# Patient Record
Sex: Male | Born: 1945 | Race: White | Hispanic: No | Smoking: Current some day smoker
Health system: Southern US, Community
[De-identification: ages and names within clinical notes are randomized; demographics above are authoritative.]

## PROBLEM LIST (undated history)

## (undated) ENCOUNTER — Inpatient Hospital Stay: Payer: Medicare HMO

## (undated) DIAGNOSIS — I7121 Aneurysm of the ascending aorta, without rupture: Secondary | ICD-10-CM

## (undated) DIAGNOSIS — Z720 Tobacco use: Secondary | ICD-10-CM

## (undated) DIAGNOSIS — I350 Nonrheumatic aortic (valve) stenosis: Secondary | ICD-10-CM

## (undated) DIAGNOSIS — J432 Centrilobular emphysema: Secondary | ICD-10-CM

## (undated) DIAGNOSIS — I10 Essential (primary) hypertension: Secondary | ICD-10-CM

## (undated) DIAGNOSIS — E785 Hyperlipidemia, unspecified: Secondary | ICD-10-CM

## (undated) DIAGNOSIS — I712 Thoracic aortic aneurysm, without rupture: Secondary | ICD-10-CM

## (undated) HISTORY — DX: Thoracic aortic aneurysm, without rupture: I71.2

## (undated) HISTORY — DX: Essential (primary) hypertension: I10

## (undated) HISTORY — DX: Aneurysm of the ascending aorta, without rupture: I71.21

## (undated) HISTORY — DX: Tobacco use: Z72.0

## (undated) HISTORY — DX: Nonrheumatic aortic (valve) stenosis: I35.0

## (undated) HISTORY — PX: CARDIAC CATHETERIZATION: SHX172

## (undated) HISTORY — PX: EYE SURGERY: SHX253

## (undated) HISTORY — DX: Hyperlipidemia, unspecified: E78.5

## (undated) HISTORY — DX: Centrilobular emphysema: J43.2

---

## 2018-06-12 ENCOUNTER — Other Ambulatory Visit: Payer: Self-pay | Admitting: Family Medicine

## 2018-06-12 ENCOUNTER — Ambulatory Visit
Admission: RE | Admit: 2018-06-12 | Discharge: 2018-06-12 | Disposition: A | Discharge status: Home / Self Care | Payer: Medicare HMO | Source: Ambulatory Visit | Attending: Family Medicine | Admitting: Family Medicine

## 2018-06-12 DIAGNOSIS — R55 Syncope and collapse: Secondary | ICD-10-CM

## 2019-12-10 ENCOUNTER — Other Ambulatory Visit: Payer: Self-pay | Admitting: Family Medicine

## 2019-12-10 DIAGNOSIS — F17219 Nicotine dependence, cigarettes, with unspecified nicotine-induced disorders: Secondary | ICD-10-CM

## 2019-12-25 ENCOUNTER — Ambulatory Visit
Admission: RE | Admit: 2019-12-25 | Discharge: 2019-12-25 | Disposition: A | Discharge status: Home / Self Care | Payer: Medicare HMO | Source: Ambulatory Visit | Attending: Family Medicine | Admitting: Family Medicine

## 2019-12-25 DIAGNOSIS — F17219 Nicotine dependence, cigarettes, with unspecified nicotine-induced disorders: Secondary | ICD-10-CM

## 2020-01-05 NOTE — Progress Notes (Deleted)
Primary Physician/Referring:  Tamsen Roers, MD  Patient ID: Kurt Allen, male    DOB: September 19, 1945, 74 y.o.   MRN: [id number]  Chief Complaint  Patient presents with  . New Patient (Initial Visit)  . Ascending thoracic aorta   HPI:    Kurt Allen  is a 74 y.o. Caucasian male with hypertension, tobacco use disorder, referred to [place] for evaluation of aortic valve disorder and aortic aneurysm.  He has 100+ year history of tobacco use, started smoking when he is 74 years of age and has been smoking about 2 packs cigarettes a day, since he was told to have an aneurysm in May 2021, he has reduced it to one fourth of a pack and is on the verge of quitting.  Except for chronic dyspnea denies palpitations, pain, claudication.  Past Medical History:  Diagnosis Date  . Hyperlipidemia   . Hypertension    History reviewed. No pertinent surgical history. History reviewed. No pertinent family history.  Social History   Tobacco Use  . Smoking status: Current Every Day Smoker    Packs/day: 0.25    Years: 50.00    Pack years: 12.50    Types: Cigarettes  . Smokeless tobacco: Never Used  Substance Use Topics  . Alcohol use: Yes    Alcohol/week: 10.0 standard drinks    Types: 10 Cans of beer per week    Comment: per week   Marital Status: Divorced  ROS  Review of Systems  Cardiovascular: Positive for dyspnea on exertion. Negative for chest pain and leg swelling.  Gastrointestinal: Negative for melena.   Objective  Blood pressure 135/82, pulse 72, temperature 98.2 F (36.8 C), temperature source Oral, resp. rate 15, height '5\' 8"'  (1.727 m), weight 156 lb (70.8 kg), SpO2 99 %.  Vitals with BMI 01/07/2020  Height '5\' 8"'   Weight 156 lbs  BMI 76.73  Systolic 419  Diastolic 82  Pulse 72     Physical Exam  Constitutional: He appears well-developed and well-nourished.  Cardiovascular: Normal rate.  Murmur heard.  Harsh midsystolic murmur is present with a grade of 3/6 at  the upper right sternal border. Pulses:      Carotid pulses are on the left side with bruit.      Femoral pulses are 2+ on the right side.      Popliteal pulses are 2+ on the right side and 2+ on the left side.       Dorsalis pedis pulses are 1+ on the right side and 2+ on the left side.       Posterior tibial pulses are 2+ on the right side and 2+ on the left side.  S1 is normal, S2 is muffled.  Pulmonary/Chest: Effort normal and breath sounds normal.  Emphysematous chest.  Abdominal: Soft. Bowel sounds are normal.   Laboratory examination:   No results for input(s): NA, K, CL, CO2, GLUCOSE, BUN, CREATININE, CALCIUM, GFRNONAA, GFRAA in the last 8760 hours. CrCl cannot be calculated (No successful lab value found.).  No flowsheet data found. No flowsheet data found. Lipid Panel  No results found for: CHOL, TRIG, HDL, CHOLHDL, VLDL, LDLCALC, LDLDIRECT HEMOGLOBIN A1C No results found for: HGBA1C, MPG TSH No results for input(s): TSH in the last 8760 hours.  External labs:  Recent labs:  10/28/2019: Glucose 99, BUN/Cr 12/1.05. EGFR 71. Na/K 135/4.6. Rest of the CMP normal  H/H 16.4/48.6. MCV 91.2. Platelets 240  HbA1C 5.2%  Chol 179, TG 72, HDL 59, LDL 104  TSH 1.39 normal  Medications and allergies  Not on File   Current Outpatient Medications  Medication Instructions  . albuterol (VENTOLIN HFA) 108 (90 Base) MCG/ACT inhaler 1 puff, As needed  . aspirin EC 81 mg, Oral, Daily  . Cholecalciferol (VITAMIN D3) 1.25 MG (50000 UT) CAPS 1 capsule, Oral, Weekly  . fluticasone (FLONASE) 50 MCG/ACT nasal spray As needed  . metoprolol succinate (TOPROL-XL) 50 MG 24 hr tablet 1 tablet, Oral, Daily  . nicotine (NICODERM CQ - DOSED IN MG/24 HOURS) 21 mg/24hr patch 1 patch, Daily  . rosuvastatin (CRESTOR) 5 mg, Oral, Daily  . tamsulosin (FLOMAX) 0.4 MG CAPS capsule 1 capsule, Oral, Daily   Radiology:   No results found.  Cardiac Studies:   CT Chest Lung 12/25/2019: 1. 8.2  mm irregular opacity anterior right lung apex, potentially scarring. Lung-RADS 4A, suspicious. Follow up low-dose chest CT without contrast in 3 months (please use the following order, "CT CHEST LCS NODULE FOLLOW-UP W/O CM") is recommended.  Alternatively, PET may be considered when there is a solid component 15m or larger. 2. 5.1 cm diameter ascending thoracic aorta with dense calcification of the aortic valve. Aortic stenosis would be a consideration. Ascending thoracic aortic aneurysm. Recommend semi-annual imaging followup by CTA or MRA and referral to cardiothoracic surgery if not already obtained. This recommendation follows 2010 ACCF/AHA/AATS/ACR/ASA/SCA/SCAI/SIR/STS/SVM Guidelines for the Diagnosis and Management of Patients With Thoracic Aortic Disease. Circulation. 2010; 121:: O671-I458   3. Emphysema (ICD10-J43.9) and Aortic Atherosclerosis.  EKG  EKG 01/07/2020: Normal sinus rhythm at rate of 70 bpm, left atrial enlargement, otherwise normal EKG.    Assessment     ICD-10-CM   1. Nonrheumatic aortic valve stenosis  I35.0 EKG 12-Lead    PCV ECHOCARDIOGRAM COMPLETE  2. Ascending aortic aneurysm (HCC)  I71.2   3. Coronary artery calcification seen on CAT scan 12/25/2019  I25.10   4. Dyspnea on exertion  R06.00   5. Tobacco use disorder  F17.200   6. Pure hypercholesterolemia  E78.00 rosuvastatin (CRESTOR) 10 MG tablet    Lipid Panel With LDL/HDL Ratio    Lipid Panel With LDL/HDL Ratio  7. Primary hypertension  I10 CMP14+EGFR    CMP14+EGFR  8. Pulsatile abdominal mass  R19.00 PCV AORTA DUPLEX  9. Left carotid bruit  R09.89 PCV CAROTID DUPLEX (BILATERAL)     Meds ordered this encounter  Medications  . rosuvastatin (CRESTOR) 10 MG tablet    Sig: Take 0.5 tablets (5 mg total) by mouth daily.    Dispense:  30 tablet    Refill:  2    Medications Discontinued During This Encounter  Medication Reason  . rosuvastatin (CRESTOR) 5 MG tablet Reorder    Recommendations:    Kurt Allen is a 74y.o. Caucasian male with hypertension, tobacco use disorder, [place].  He has 100+ year history of tobacco use, started smoking when he is 74years of age and has been smoking about 2 packs cigarettes a day, since he was told to have an aneurysm in May 2021, he has reduced it to one fourth of a pack and is on the verge of quitting.  I have congratulated him having made lifestyle changes especially smoking cessation.  Will increase Crestor from 5 mg every other day to 10 mg daily in view of coronary calcification noted on CT scan and AAA.  Aortic stenosis appears to be very severe on auscultation, I will obtain  an echocardiogram.  He also has very faint left carotid bruit, will obtain carotid artery duplex.  Mild pulsatile abdominal mass, need to exclude AAA will obtain aortic duplex.  Blood pressures well controlled, otherwise physical examination is unremarkable and I would like to see him back in 4 to 6 weeks for follow-up.  I will reserve doing stress test as I suspect his aortic stenosis may be severe and depending upon the findings I may consider either a stress test or cardiac catheterization.  I have reviewed his external records.  Adrian Prows, MD, [facility] 01/07/2020, 1:44 PM [facility]. [place] Office: [phone]

## 2020-01-07 ENCOUNTER — Other Ambulatory Visit: Payer: Self-pay

## 2020-01-07 ENCOUNTER — Encounter: Payer: Self-pay | Admitting: Cardiology

## 2020-01-07 ENCOUNTER — Ambulatory Visit: Payer: Medicare HMO | Admitting: Cardiology

## 2020-01-07 VITALS — BP 135/82 | HR 72 | Temp 98.2°F | Resp 15 | Ht 68.0 in | Wt 156.0 lb

## 2020-01-07 DIAGNOSIS — R0989 Other specified symptoms and signs involving the circulatory and respiratory systems: Secondary | ICD-10-CM

## 2020-01-07 DIAGNOSIS — I2510 Atherosclerotic heart disease of native coronary artery without angina pectoris: Secondary | ICD-10-CM

## 2020-01-07 DIAGNOSIS — I350 Nonrheumatic aortic (valve) stenosis: Secondary | ICD-10-CM

## 2020-01-07 DIAGNOSIS — I7121 Aneurysm of the ascending aorta, without rupture: Secondary | ICD-10-CM

## 2020-01-07 DIAGNOSIS — R0609 Other forms of dyspnea: Secondary | ICD-10-CM

## 2020-01-07 DIAGNOSIS — F17200 Nicotine dependence, unspecified, uncomplicated: Secondary | ICD-10-CM

## 2020-01-07 DIAGNOSIS — I10 Essential (primary) hypertension: Secondary | ICD-10-CM

## 2020-01-07 DIAGNOSIS — E7800 Pure hypercholesterolemia, unspecified: Secondary | ICD-10-CM

## 2020-01-07 DIAGNOSIS — R1900 Intra-abdominal and pelvic swelling, mass and lump, unspecified site: Secondary | ICD-10-CM

## 2020-01-07 MED ORDER — ROSUVASTATIN CALCIUM 10 MG PO TABS: 5.0000 mg | ORAL_TABLET | Freq: Every day | ORAL | 2 refills | Status: DC

## 2020-01-07 NOTE — Progress Notes (Signed)
Primary Physician/Referring:  Tamsen Roers, MD  Patient ID: Kurt Allen, male    DOB: Dec 06, 1945, 74 y.o.   MRN: [id number]  Chief Complaint  Patient presents with  . New Patient (Initial Visit)  . Ascending thoracic aorta   HPI:    Kurt Allen  is a 74 y.o. Caucasian male with hypertension, tobacco use disorder, referred to [place] for evaluation of aortic valve disorder and aortic aneurysm.  He has 100+ year history of tobacco use, started smoking when he is 74 years of age and has been smoking about 2 packs cigarettes a day, since he was told to have an aneurysm in May 2021, he has reduced it to one fourth of a pack and is on the verge of quitting.  Except for chronic dyspnea denies palpitations, pain, claudication.  Past Medical History:  Diagnosis Date  . Hyperlipidemia   . Hypertension    History reviewed. No pertinent surgical history. History reviewed. No pertinent family history.  Social History   Tobacco Use  . Smoking status: Current Every Day Smoker    Packs/day: 0.25    Years: 50.00    Pack years: 12.50    Types: Cigarettes  . Smokeless tobacco: Never Used  Substance Use Topics  . Alcohol use: Yes    Alcohol/week: 10.0 standard drinks    Types: 10 Cans of beer per week    Comment: per week   Marital Status: Divorced  ROS  Review of Systems  Cardiovascular: Positive for dyspnea on exertion. Negative for chest pain and leg swelling.  Gastrointestinal: Negative for melena.   Objective  Blood pressure 135/82, pulse 72, temperature 98.2 F (36.8 C), temperature source Oral, resp. rate 15, height '5\' 8"'  (1.727 m), weight 156 lb (70.8 kg), SpO2 99 %.  Vitals with BMI 01/07/2020  Height '5\' 8"'   Weight 156 lbs  BMI 28.76  Systolic 811  Diastolic 82  Pulse 72     Physical Exam  Constitutional: He appears well-developed and well-nourished.  Cardiovascular: Normal rate.  Murmur heard.  Harsh midsystolic murmur is present with a grade of 3/6 at  the upper right sternal border. Pulses:      Carotid pulses are on the left side with bruit.      Femoral pulses are 2+ on the right side.      Popliteal pulses are 2+ on the right side and 2+ on the left side.       Dorsalis pedis pulses are 1+ on the right side and 2+ on the left side.       Posterior tibial pulses are 2+ on the right side and 2+ on the left side.  S1 is normal, S2 is muffled.  Pulmonary/Chest: Effort normal and breath sounds normal.  Emphysematous chest.  Abdominal: Soft. Bowel sounds are normal.   Laboratory examination:   No results for input(s): NA, K, CL, CO2, GLUCOSE, BUN, CREATININE, CALCIUM, GFRNONAA, GFRAA in the last 8760 hours. CrCl cannot be calculated (No successful lab value found.).  No flowsheet data found. No flowsheet data found. Lipid Panel  No results found for: CHOL, TRIG, HDL, CHOLHDL, VLDL, LDLCALC, LDLDIRECT HEMOGLOBIN A1C No results found for: HGBA1C, MPG TSH No results for input(s): TSH in the last 8760 hours.  External labs:  Recent labs:  10/28/2019: Glucose 99, BUN/Cr 12/1.05. EGFR 71. Na/K 135/4.6. Rest of the CMP normal  H/H 16.4/48.6. MCV 91.2. Platelets 240  HbA1C 5.2%  Chol 179, TG 72, HDL 59, LDL 104  TSH 1.39 normal  Medications and allergies  Not on File   Current Outpatient Medications  Medication Instructions  . albuterol (VENTOLIN HFA) 108 (90 Base) MCG/ACT inhaler 1 puff, As needed  . aspirin EC 81 mg, Oral, Daily  . Cholecalciferol (VITAMIN D3) 1.25 MG (50000 UT) CAPS 1 capsule, Oral, Weekly  . fluticasone (FLONASE) 50 MCG/ACT nasal spray As needed  . metoprolol succinate (TOPROL-XL) 50 MG 24 hr tablet 1 tablet, Oral, Daily  . nicotine (NICODERM CQ - DOSED IN MG/24 HOURS) 21 mg/24hr patch 1 patch, Daily  . rosuvastatin (CRESTOR) 5 mg, Oral, Daily  . tamsulosin (FLOMAX) 0.4 MG CAPS capsule 1 capsule, Oral, Daily   Radiology:   No results found.  Cardiac Studies:   CT Chest Lung 12/25/2019: 1. 8.2  mm irregular opacity anterior right lung apex, potentially scarring. Lung-RADS 4A, suspicious. Follow up low-dose chest CT without contrast in 3 months (please use the following order, "CT CHEST LCS NODULE FOLLOW-UP W/O CM") is recommended.  Alternatively, PET may be considered when there is a solid component 39m or larger. 2. 5.1 cm diameter ascending thoracic aorta with dense calcification of the aortic valve. Aortic stenosis would be a consideration. Ascending thoracic aortic aneurysm. Recommend semi-annual imaging followup by CTA or MRA and referral to cardiothoracic surgery if not already obtained. This recommendation follows 2010 ACCF/AHA/AATS/ACR/ASA/SCA/SCAI/SIR/STS/SVM Guidelines for the Diagnosis and Management of Patients With Thoracic Aortic Disease. Circulation. 2010; 121:: H150-V697   3. Emphysema (ICD10-J43.9) and Aortic Atherosclerosis.  EKG  EKG 01/07/2020: Normal sinus rhythm at rate of 70 bpm, left atrial enlargement, otherwise normal EKG.    Assessment     ICD-10-CM   1. Nonrheumatic aortic valve stenosis  I35.0 EKG 12-Lead    PCV ECHOCARDIOGRAM COMPLETE  2. Ascending aortic aneurysm (HCC)  I71.2   3. Coronary artery calcification seen on CAT scan 12/25/2019  I25.10   4. Dyspnea on exertion  R06.00   5. Tobacco use disorder  F17.200   6. Pure hypercholesterolemia  E78.00 rosuvastatin (CRESTOR) 10 MG tablet    Lipid Panel With LDL/HDL Ratio    Lipid Panel With LDL/HDL Ratio  7. Primary hypertension  I10 CMP14+EGFR    CMP14+EGFR  8. Pulsatile abdominal mass  R19.00 PCV AORTA DUPLEX  9. Left carotid bruit  R09.89 PCV CAROTID DUPLEX (BILATERAL)     Meds ordered this encounter  Medications  . rosuvastatin (CRESTOR) 10 MG tablet    Sig: Take 0.5 tablets (5 mg total) by mouth daily.    Dispense:  30 tablet    Refill:  2    Medications Discontinued During This Encounter  Medication Reason  . rosuvastatin (CRESTOR) 5 MG tablet Reorder    Recommendations:    Kurt Allen is a 73y.o. Caucasian male with hypertension, tobacco use disorder, [place].  He has 100+ year history of tobacco use, started smoking when he is 74years of age and has been smoking about 2 packs cigarettes a day, since he was told to have an aneurysm in May 2021, he has reduced it to one fourth of a pack and is on the verge of quitting.  I have congratulated him having made lifestyle changes especially smoking cessation.  Will increase Crestor from 5 mg every other day to 10 mg daily in view of coronary calcification noted on CT scan and AAA.  Aortic stenosis appears to be very severe on auscultation, I will obtain  an echocardiogram.  He also has very faint left carotid bruit, will obtain carotid artery duplex.  Mild pulsatile abdominal mass, need to exclude AAA will obtain aortic duplex.  Blood pressures well controlled, otherwise physical examination is unremarkable and I would like to see him back in 4 to 6 weeks for follow-up.  I will reserve doing stress test as I suspect his aortic stenosis may be severe and depending upon the findings I may consider either a stress test or cardiac catheterization.  I have reviewed his external records.  Adrian Prows, MD, [facility] 01/07/2020, 4:34 PM [facility]. [place] Office: [phone]

## 2020-01-07 NOTE — Patient Instructions (Signed)
Lab work at [facility] in 1 month.

## 2020-01-13 ENCOUNTER — Other Ambulatory Visit: Payer: Self-pay

## 2020-01-13 ENCOUNTER — Ambulatory Visit: Payer: Medicare HMO

## 2020-01-13 DIAGNOSIS — R1900 Intra-abdominal and pelvic swelling, mass and lump, unspecified site: Secondary | ICD-10-CM

## 2020-01-13 DIAGNOSIS — R0989 Other specified symptoms and signs involving the circulatory and respiratory systems: Secondary | ICD-10-CM

## 2020-01-13 DIAGNOSIS — I700 Atherosclerosis of aorta: Secondary | ICD-10-CM

## 2020-01-13 DIAGNOSIS — I350 Nonrheumatic aortic (valve) stenosis: Secondary | ICD-10-CM

## 2020-01-18 ENCOUNTER — Other Ambulatory Visit: Payer: Self-pay | Admitting: Cardiology

## 2020-01-18 DIAGNOSIS — R0989 Other specified symptoms and signs involving the circulatory and respiratory systems: Secondary | ICD-10-CM

## 2020-01-19 NOTE — Progress Notes (Signed)
Change his appointment to me as he is new and I saw him.  Also need to see him sooner than 02/19/20, may be in couple weeks

## 2020-01-19 NOTE — Progress Notes (Signed)
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

## 2020-01-19 NOTE — Progress Notes (Signed)
Severe AS, will discuss on OV and probably need left and right heart cath

## 2020-02-03 LAB — CMP14+EGFR
ALT: 15 IU/L (ref 0–44)
AST: 17 IU/L (ref 0–40)
Albumin/Globulin Ratio: 2.4 — ABNORMAL HIGH (ref 1.2–2.2)
Albumin: 4.7 g/dL (ref 3.7–4.7)
Alkaline Phosphatase: 59 IU/L (ref 48–121)
BUN/Creatinine Ratio: 9 — ABNORMAL LOW (ref 10–24)
BUN: 9 mg/dL (ref 8–27)
Bilirubin Total: 0.5 mg/dL (ref 0.0–1.2)
CO2: 23 mmol/L (ref 20–29)
Calcium: 9.3 mg/dL (ref 8.6–10.2)
Chloride: 98 mmol/L (ref 96–106)
Creatinine, Ser: 1.02 mg/dL (ref 0.76–1.27)
GFR calc Af Amer: 84 mL/min/{1.73_m2} (ref >59)
GFR calc non Af Amer: 73 mL/min/{1.73_m2} (ref >59)
Globulin, Total: 2 g/dL (ref 1.5–4.5)
Glucose: 95 mg/dL (ref 65–99)
Potassium: 4.7 mmol/L (ref 3.5–5.2)
Sodium: 136 mmol/L (ref 134–144)
Total Protein: 6.7 g/dL (ref 6.0–8.5)

## 2020-02-03 LAB — LIPID PANEL WITH LDL/HDL RATIO
Cholesterol, Total: 133 mg/dL (ref 100–199)
HDL: 69 mg/dL (ref >39)
LDL Chol Calc (NIH): 52 mg/dL (ref 0–99)
LDL/HDL Ratio: 0.8 ratio (ref 0.0–3.6)
Triglycerides: 57 mg/dL (ref 0–149)
VLDL Cholesterol Cal: 12 mg/dL (ref 5–40)

## 2020-02-03 NOTE — Progress Notes (Signed)
CMP and lipids normal and will discuss on OV. CC PCP

## 2020-02-06 ENCOUNTER — Ambulatory Visit: Payer: Medicare HMO | Admitting: Cardiology

## 2020-02-06 ENCOUNTER — Other Ambulatory Visit: Payer: Self-pay

## 2020-02-06 ENCOUNTER — Encounter: Payer: Self-pay | Admitting: Cardiology

## 2020-02-06 VITALS — BP 151/94 | HR 56 | Ht 68.0 in | Wt 156.4 lb

## 2020-02-06 DIAGNOSIS — I10 Essential (primary) hypertension: Secondary | ICD-10-CM

## 2020-02-06 DIAGNOSIS — I2510 Atherosclerotic heart disease of native coronary artery without angina pectoris: Secondary | ICD-10-CM

## 2020-02-06 DIAGNOSIS — R0609 Other forms of dyspnea: Secondary | ICD-10-CM

## 2020-02-06 DIAGNOSIS — I6522 Occlusion and stenosis of left carotid artery: Secondary | ICD-10-CM

## 2020-02-06 DIAGNOSIS — I7121 Aneurysm of the ascending aorta, without rupture: Secondary | ICD-10-CM

## 2020-02-06 DIAGNOSIS — I350 Nonrheumatic aortic (valve) stenosis: Secondary | ICD-10-CM

## 2020-02-06 MED ORDER — LOSARTAN POTASSIUM-HCTZ 50-12.5 MG PO TABS: 1.0000 | ORAL_TABLET | Freq: Every morning | ORAL | 2 refills | Status: DC

## 2020-02-06 NOTE — Progress Notes (Signed)
Primary Physician/Referring:  Tamsen Roers, MD  Patient ID: Kurt Allen, male    DOB: [birth year], 74 y.o.   MRN: [id number]  Chief Complaint  Patient presents with  . Follow-up    6 week   . Shortness of Breath    aortic aneurysm   HPI:    Kurt Allen  is a 74 y.o. Caucasian male with hypertension, tobacco use disorder, referred to [place] for evaluation of aortic valve disorder and aortic aneurysm.  He has 100+ year history of tobacco use, started smoking when he is 74 years of age and has been smoking about 2 packs cigarettes a day, since he was told to have an aneurysm in May 2021, he has reduced it to one fourth of a pack and is on the verge of quitting.  Except for chronic dyspnea denies palpitations, pain, claudication.  On his last office visit a month ago, I had ordered echocardiogram and carotid artery duplex and he presents for follow-up.  No new symptomatology.  Past Medical History:  Diagnosis Date  . Hyperlipidemia   . Hypertension    History reviewed. No pertinent surgical history. History reviewed. No pertinent family history.  Social History   Tobacco Use  . Smoking status: Current Every Day Smoker    Packs/day: 0.25    Years: 50.00    Pack years: 12.50    Types: Cigarettes  . Smokeless tobacco: Never Used  Substance Use Topics  . Alcohol use: Yes    Alcohol/week: 10.0 standard drinks    Types: 10 Cans of beer per week    Comment: per week   Marital Status: Divorced  ROS  Review of Systems  Cardiovascular: Positive for dyspnea on exertion. Negative for chest pain and leg swelling.  Gastrointestinal: Negative for melena.   Objective  Blood pressure (!) 151/94, pulse (!) 56, height 5' 8" (1.727 m), weight 156 lb 6.4 oz (70.9 kg), SpO2 97 %.  Vitals with BMI 02/06/2020 01/07/2020  Height 5' 8" 5' 8"  Weight 156 lbs 6 oz 156 lbs  BMI 29.02 11.15  Systolic 520 802  Diastolic 94 82  Pulse 56 72     Physical Exam Constitutional:       Appearance: He is well-developed.  Cardiovascular:     Rate and Rhythm: Normal rate.     Pulses:          Carotid pulses are on the left side with bruit.      Femoral pulses are 2+ on the right side.      Popliteal pulses are 2+ on the right side and 2+ on the left side.       Dorsalis pedis pulses are 1+ on the right side and 2+ on the left side.       Posterior tibial pulses are 2+ on the right side and 2+ on the left side.     Heart sounds: Murmur heard.  Harsh midsystolic murmur is present with a grade of 3/6 at the upper right sternal border.      Comments: S1 is normal, S2 is muffled. Pulmonary:     Effort: Pulmonary effort is normal.     Breath sounds: Normal breath sounds.  Abdominal:     General: Bowel sounds are normal.     Palpations: Abdomen is soft.    Laboratory examination:   Recent Labs    02/02/20 0957  NA 136  K 4.7  CL 98  CO2 23  GLUCOSE 95  BUN 9  CREATININE 1.02  CALCIUM 9.3  GFRNONAA 73  GFRAA 84   estimated creatinine clearance is 63.3 mL/min (by C-G formula based on SCr of 1.02 mg/dL).  CMP Latest Ref Rng & Units 02/02/2020  Glucose 65 - 99 mg/dL 95  BUN 8 - 27 mg/dL 9  Creatinine 0.76 - 1.27 mg/dL 1.02  Sodium 134 - 144 mmol/L 136  Potassium 3.5 - 5.2 mmol/L 4.7  Chloride 96 - 106 mmol/L 98  CO2 20 - 29 mmol/L 23  Calcium 8.6 - 10.2 mg/dL 9.3  Total Protein 6.0 - 8.5 g/dL 6.7  Total Bilirubin 0.0 - 1.2 mg/dL 0.5  Alkaline Phos 48 - 121 IU/L 59  AST 0 - 40 IU/L 17  ALT 0 - 44 IU/L 15   No flowsheet data found. Lipid Panel     Component Value Date/Time   CHOL 133 02/02/2020 0956   TRIG 57 02/02/2020 0956   HDL 69 02/02/2020 0956   LDLCALC 52 02/02/2020 0956   HEMOGLOBIN A1C No results found for: HGBA1C, MPG TSH No results for input(s): TSH in the last 8760 hours.  External labs:  Recent labs:  10/28/2019: Glucose 99, BUN/Cr 12/1.05. EGFR 71. Na/K 135/4.6. Rest of the CMP normal  H/H 16.4/48.6. MCV 91.2. Platelets  240  HbA1C 5.2%  Chol 179, TG 72, HDL 59, LDL 104  TSH 1.39 normal  Medications and allergies   No Known Allergies   Current Outpatient Medications  Medication Instructions  . albuterol (VENTOLIN HFA) 108 (90 Base) MCG/ACT inhaler 1 puff, As needed  . aspirin EC 81 mg, Oral, Daily  . Cholecalciferol (VITAMIN D3) 1.25 MG (50000 UT) CAPS 1 capsule, Oral, Weekly  . fluticasone (FLONASE) 50 MCG/ACT nasal spray As needed  . losartan-hydrochlorothiazide (HYZAAR) 50-12.5 MG tablet 1 tablet, Oral,  Every morning - 10a  . metoprolol succinate (TOPROL-XL) 50 MG 24 hr tablet 1 tablet, Oral, Daily  . nicotine (NICODERM CQ - DOSED IN MG/24 HOURS) 21 mg/24hr patch 1 patch, Daily  . rosuvastatin (CRESTOR) 5 mg, Oral, Daily  . tamsulosin (FLOMAX) 0.4 MG CAPS capsule 1 capsule, Oral, Daily   Radiology:   CT Chest Lung 12/25/2019: 1. 8.2 mm irregular opacity anterior right lung apex, potentially scarring. Lung-RADS 4A, suspicious. Follow up low-dose chest CT without contrast in 3 months (please use the following order, "CT CHEST LCS NODULE FOLLOW-UP W/O CM") is recommended.  Alternatively, PET may be considered when there is a solid component 72m or larger. 2. 5.1 cm diameter ascending thoracic aorta with dense calcification of the aortic valve. Aortic stenosis would be a consideration. Ascending thoracic aortic aneurysm. Recommend semi-annual imaging followup by CTA or MRA and referral to cardiothoracic surgery if not already obtained. This recommendation follows 2010 ACCF/AHA/AATS/ACR/ASA/SCA/SCAI/SIR/STS/SVM Guidelines for the Diagnosis and Management of Patients With Thoracic Aortic Disease. Circulation. 2010; 121:: B017-P102   3. Emphysema (ICD10-J43.9) and Aortic Atherosclerosis.  Cardiac Studies:   Abdominal Aortic Duplex 01/13/2020:  The maximum aorta (sac) diameter is 2.22 cm (mid). Mild ectasia noted.  Diffuse plaque observed in the proximal, mid and distal aorta. Normal flow   velocities noted in the aorta and iliac arteries.  No AAA noted. Consider recheck in 10 years for progression to AAA.  Carotid artery duplex 058/52/7782  Peak systolic velocities in the right bifurcation, internal, external and  common carotid arteries are within normal limits.  Stenosis in the left internal carotid artery (16-49%). However left vessel  is tortuous, bruit and elevated velocity may  be related to geometry.  Antegrade right vertebral artery flow. Antegrade left vertebral artery  Flow.  Echocardiogram 01/13/2020:  Left ventricle cavity is normal in size. Moderate concentric hypertrophy  of the left ventricle. Normal global wall motion. Normal LV systolic  function with visual EF 50-55%. Indeterminate diastolic filling pattern.  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 Severe  annular and leaflet calcification.  Severe aortic stenosis. Aortic valve mean gradient of 44 mmHg, Vmax of 4.1  m/s. Calculated aortic valve area by continuity equation is 0.5 cm.  Mild (Grade I) aortic regurgitation.  Mild (Grade I) mitral regurgitation.  Inadequate TR jet to estimate pulmonary artery systolic pressure. Normal  right atrial pressure.  EKG  EKG 01/07/2020: Normal sinus rhythm at rate of 70 bpm, left atrial enlargement, otherwise normal EKG.   Assessment     ICD-10-CM   1. Coronary artery calcification seen on CAT scan 12/25/2019  T46.56 Basic metabolic panel    CBC  2. Ascending aortic aneurysm (HCC)  I71.2 PCV ANKLE BRACHIAL INDEX (ABI)  3. Nonrheumatic aortic valve stenosis  I35.0 Ambulatory referral to Pulmonology  4. Asymptomatic stenosis of left carotid artery  I65.22   5. Dyspnea on exertion  R06.00 Ambulatory referral to Pulmonology  6. Primary hypertension  I10 losartan-hydrochlorothiazide (HYZAAR) 50-12.5 MG tablet    Meds ordered this encounter  Medications  . losartan-hydrochlorothiazide (HYZAAR) 50-12.5 MG tablet    Sig: Take 1 tablet by mouth  every morning.    Dispense:  30 tablet    Refill:  2    There are no discontinued medications.  Recommendations:   Jamarion [place]  is a 74 y.o. Caucasian male with hypertension, tobacco use disorder, referred to [place] for evaluation of aortic valve disorder and aortic aneurysm.  He has 100+ year history of tobacco use, started smoking when he is 74 years of age and has been smoking about 2 packs cigarettes a day, since he was told to have an aneurysm in May 2021, he has reduced it to one fourth of a pack and is on the verge of quitting, now smoking 1/4th pack.  Except for chronic dyspnea no specific symptoms, he is tolerating Crestor, lipids are now well controlled.  I reviewed his echocardiogram and the aortic valve stenosis is severe.  In view of large ascending aortic aneurysm and also severe aortic stenosis in a patient will get dyspnea which may be related to underlying CAD versus mild COPD but could also be related to aortic stenosis, I have recommended that we proceed with right and left heart catheterization.  Schedule for cardiac catheterization, and possible angioplasty. We discussed regarding risks, benefits, alternatives to this including stress testing, CTA and continued medical therapy. Patient wants to proceed. Understands <1-2% risk of death, stroke, MI, urgent CABG, bleeding, infection, renal failure but not limited to these.   I will also refer him to be evaluated by pulmonary medicine in view of abnormal CT scan and to evaluate him for preoperatively stratification from pulmonary standpoint in view of long-term history of smoking.  With regard to abdominal aortic duplex, he has very mild ectasia/very small and small dilatation, less than 3 cm, hence will need repeat scanning probably in about 5 to 10 years.  He blood pressure is elevated today and also ascending aortic aneurysm, I started him on losartan HCT 50/12.5 mg in the morning.  He will obtain CBC and BMP in 10 days to 2  weeks.  We will obtain  presurgical ABI as well.  Carotid artery duplex reveals very mild stenosis in the left carotid artery.  He is now on a statin and is on aspirin, continue the same.  He will need annual surveillance.  I will see him back in 3 to 4 weeks for follow-up after the procedures.  Adrian Prows, MD, [facility] 02/06/2020, 12:33 PM [facility]. [place] Office: [phone]

## 2020-02-12 ENCOUNTER — Other Ambulatory Visit: Payer: Self-pay

## 2020-02-12 ENCOUNTER — Ambulatory Visit: Payer: Medicare HMO

## 2020-02-12 DIAGNOSIS — Z01810 Encounter for preprocedural cardiovascular examination: Secondary | ICD-10-CM

## 2020-02-12 DIAGNOSIS — I7121 Aneurysm of the ascending aorta, without rupture: Secondary | ICD-10-CM

## 2020-02-12 LAB — BASIC METABOLIC PANEL
BUN/Creatinine Ratio: 9 — ABNORMAL LOW (ref 10–24)
BUN: 10 mg/dL (ref 8–27)
CO2: 26 mmol/L (ref 20–29)
Calcium: 9.6 mg/dL (ref 8.6–10.2)
Chloride: 96 mmol/L (ref 96–106)
Creatinine, Ser: 1.16 mg/dL (ref 0.76–1.27)
GFR calc Af Amer: 72 mL/min/{1.73_m2} (ref >59)
GFR calc non Af Amer: 63 mL/min/{1.73_m2} (ref >59)
Glucose: 111 mg/dL — ABNORMAL HIGH (ref 65–99)
Potassium: 4.3 mmol/L (ref 3.5–5.2)
Sodium: 135 mmol/L (ref 134–144)

## 2020-02-12 LAB — CBC
Hematocrit: 46.4 % (ref 37.5–51.0)
Hemoglobin: 15.8 g/dL (ref 13.0–17.7)
MCH: 31 pg (ref 26.6–33.0)
MCHC: 34.1 g/dL (ref 31.5–35.7)
MCV: 91 fL (ref 79–97)
Platelets: 180 10*3/uL (ref 150–450)
RBC: 5.1 x10E6/uL (ref 4.14–5.80)
RDW: 13.2 % (ref 11.6–15.4)
WBC: 5.5 10*3/uL (ref 3.4–10.8)

## 2020-02-16 DIAGNOSIS — I7122 Aneurysm of the aortic arch, without rupture: Secondary | ICD-10-CM

## 2020-02-16 DIAGNOSIS — I350 Nonrheumatic aortic (valve) stenosis: Secondary | ICD-10-CM

## 2020-02-16 DIAGNOSIS — I7121 Aneurysm of the ascending aorta, without rupture: Secondary | ICD-10-CM

## 2020-02-16 DIAGNOSIS — I712 Thoracic aortic aneurysm, without rupture: Secondary | ICD-10-CM

## 2020-02-16 NOTE — Progress Notes (Signed)
Normal ABI

## 2020-02-17 ENCOUNTER — Ambulatory Visit (HOSPITAL_COMMUNITY)
Admission: RE | Admit: 2020-02-17 | Discharge: 2020-02-17 | Disposition: A | Discharge status: Home / Self Care | Payer: Medicare HMO | Attending: Cardiology | Admitting: Cardiology

## 2020-02-17 ENCOUNTER — Ambulatory Visit (HOSPITAL_COMMUNITY)
Admission: RE | Disposition: A | Discharge status: Home / Self Care | Payer: Self-pay | Source: Home / Self Care | Attending: Cardiology

## 2020-02-17 ENCOUNTER — Other Ambulatory Visit: Payer: Self-pay

## 2020-02-17 DIAGNOSIS — I6522 Occlusion and stenosis of left carotid artery: Secondary | ICD-10-CM | POA: Diagnosis not present

## 2020-02-17 DIAGNOSIS — I7122 Aneurysm of the aortic arch, without rupture: Secondary | ICD-10-CM

## 2020-02-17 DIAGNOSIS — E785 Hyperlipidemia, unspecified: Secondary | ICD-10-CM | POA: Diagnosis not present

## 2020-02-17 DIAGNOSIS — I712 Thoracic aortic aneurysm, without rupture: Secondary | ICD-10-CM | POA: Diagnosis not present

## 2020-02-17 DIAGNOSIS — F17210 Nicotine dependence, cigarettes, uncomplicated: Secondary | ICD-10-CM | POA: Diagnosis not present

## 2020-02-17 DIAGNOSIS — Z7982 Long term (current) use of aspirin: Secondary | ICD-10-CM | POA: Diagnosis not present

## 2020-02-17 DIAGNOSIS — R0600 Dyspnea, unspecified: Secondary | ICD-10-CM | POA: Insufficient documentation

## 2020-02-17 DIAGNOSIS — Z79899 Other long term (current) drug therapy: Secondary | ICD-10-CM | POA: Insufficient documentation

## 2020-02-17 DIAGNOSIS — I350 Nonrheumatic aortic (valve) stenosis: Secondary | ICD-10-CM | POA: Diagnosis present

## 2020-02-17 DIAGNOSIS — I10 Essential (primary) hypertension: Secondary | ICD-10-CM | POA: Insufficient documentation

## 2020-02-17 DIAGNOSIS — I7121 Aneurysm of the ascending aorta, without rupture: Secondary | ICD-10-CM

## 2020-02-17 HISTORY — PX: RIGHT HEART CATH AND CORONARY ANGIOGRAPHY: CATH118264

## 2020-02-17 LAB — POCT I-STAT 7, (LYTES, BLD GAS, ICA,H+H)
Acid-Base Excess: 3 mmol/L — ABNORMAL HIGH (ref 0.0–2.0)
Bicarbonate: 29.2 mmol/L — ABNORMAL HIGH (ref 20.0–28.0)
Calcium, Ion: 1.2 mmol/L (ref 1.15–1.40)
HCT: 40 % (ref 39.0–52.0)
Hemoglobin: 13.6 g/dL (ref 13.0–17.0)
O2 Saturation: 99 %
Potassium: 3.7 mmol/L (ref 3.5–5.1)
Sodium: 135 mmol/L (ref 135–145)
TCO2: 31 mmol/L (ref 22–32)
pCO2 arterial: 52.6 mmHg — ABNORMAL HIGH (ref 32.0–48.0)
pH, Arterial: 7.353 (ref 7.350–7.450)
pO2, Arterial: 131 mmHg — ABNORMAL HIGH (ref 83.0–108.0)

## 2020-02-17 LAB — POCT I-STAT EG7
Acid-Base Excess: 4 mmol/L — ABNORMAL HIGH (ref 0.0–2.0)
Bicarbonate: 30.8 mmol/L — ABNORMAL HIGH (ref 20.0–28.0)
Calcium, Ion: 1.24 mmol/L (ref 1.15–1.40)
HCT: 42 % (ref 39.0–52.0)
Hemoglobin: 14.3 g/dL (ref 13.0–17.0)
O2 Saturation: 72 %
Potassium: 3.9 mmol/L (ref 3.5–5.1)
Sodium: 134 mmol/L — ABNORMAL LOW (ref 135–145)
TCO2: 32 mmol/L (ref 22–32)
pCO2, Ven: 54.4 mmHg (ref 44.0–60.0)
pH, Ven: 7.361 (ref 7.250–7.430)
pO2, Ven: 40 mmHg (ref 32.0–45.0)

## 2020-02-17 SURGERY — RIGHT HEART CATH AND CORONARY ANGIOGRAPHY
Anesthesia: LOCAL

## 2020-02-17 MED ORDER — SODIUM CHLORIDE 0.9 % IV BOLUS
500.0000 mL | Freq: Once | INTRAVENOUS | Status: AC
  Administered 2020-02-17: 500 mL via INTRAVENOUS

## 2020-02-17 MED ORDER — ASPIRIN 81 MG PO CHEW: 81.0000 mg | CHEWABLE_TABLET | ORAL | Status: DC

## 2020-02-17 MED ORDER — MIDAZOLAM HCL 2 MG/2ML IJ SOLN
INTRAMUSCULAR | Status: AC
  Filled 2020-02-17: qty 2

## 2020-02-17 MED ORDER — ONDANSETRON HCL 4 MG/2ML IJ SOLN: 4.0000 mg | Freq: Four times a day (QID) | INTRAMUSCULAR | Status: DC | PRN

## 2020-02-17 MED ORDER — SODIUM CHLORIDE 0.9% FLUSH: 3.0000 mL | INTRAVENOUS | Status: DC | PRN

## 2020-02-17 MED ORDER — FENTANYL CITRATE (PF) 100 MCG/2ML IJ SOLN
INTRAMUSCULAR | Status: AC
  Filled 2020-02-17: qty 2

## 2020-02-17 MED ORDER — SODIUM CHLORIDE 0.9 % WEIGHT BASED INFUSION
1.0000 mL/kg/h | INTRAVENOUS | Status: DC
  Administered 2020-02-17: 250 mL via INTRAVENOUS

## 2020-02-17 MED ORDER — VERAPAMIL HCL 2.5 MG/ML IV SOLN
INTRAVENOUS | Status: AC
  Filled 2020-02-17: qty 2

## 2020-02-17 MED ORDER — ATROPINE SULFATE 1 MG/10ML IJ SOSY
PREFILLED_SYRINGE | INTRAMUSCULAR | Status: AC
  Filled 2020-02-17: qty 10

## 2020-02-17 MED ORDER — FENTANYL CITRATE (PF) 100 MCG/2ML IJ SOLN
INTRAMUSCULAR | Status: DC | PRN
  Administered 2020-02-17: 25 ug via INTRAVENOUS

## 2020-02-17 MED ORDER — LIDOCAINE HCL (PF) 1 % IJ SOLN
INTRAMUSCULAR | Status: AC
  Filled 2020-02-17: qty 30

## 2020-02-17 MED ORDER — SODIUM CHLORIDE 0.9 % IV SOLN: 250.0000 mL | INTRAVENOUS | Status: DC | PRN

## 2020-02-17 MED ORDER — HEPARIN SODIUM (PORCINE) 1000 UNIT/ML IJ SOLN
INTRAMUSCULAR | Status: DC | PRN
  Administered 2020-02-17: 4000 [IU] via INTRAVENOUS

## 2020-02-17 MED ORDER — HEPARIN (PORCINE) IN NACL 1000-0.9 UT/500ML-% IV SOLN
INTRAVENOUS | Status: AC
  Filled 2020-02-17: qty 1000

## 2020-02-17 MED ORDER — ATROPINE SULFATE 1 MG/10ML IJ SOSY
PREFILLED_SYRINGE | INTRAMUSCULAR | Status: DC | PRN
  Administered 2020-02-17: 0.5 mg via INTRAVENOUS

## 2020-02-17 MED ORDER — ATROPINE SULFATE 1 MG/10ML IJ SOSY
PREFILLED_SYRINGE | INTRAMUSCULAR | Status: AC
  Administered 2020-02-17: 0.3 mg via INTRAVENOUS
  Filled 2020-02-17: qty 10

## 2020-02-17 MED ORDER — HEPARIN SODIUM (PORCINE) 1000 UNIT/ML IJ SOLN
INTRAMUSCULAR | Status: AC
  Filled 2020-02-17: qty 1

## 2020-02-17 MED ORDER — HEPARIN (PORCINE) IN NACL 1000-0.9 UT/500ML-% IV SOLN
INTRAVENOUS | Status: DC | PRN
  Administered 2020-02-17: 500 mL

## 2020-02-17 MED ORDER — SODIUM CHLORIDE 0.9% FLUSH: 3.0000 mL | Freq: Two times a day (BID) | INTRAVENOUS | Status: DC

## 2020-02-17 MED ORDER — SODIUM CHLORIDE 0.9 % IV BOLUS: 500.0000 mL | Freq: Once | INTRAVENOUS | Status: DC

## 2020-02-17 MED ORDER — IOHEXOL 350 MG/ML SOLN
INTRAVENOUS | Status: DC | PRN
  Administered 2020-02-17: 35 mL

## 2020-02-17 MED ORDER — MIDAZOLAM HCL 2 MG/2ML IJ SOLN
INTRAMUSCULAR | Status: DC | PRN
  Administered 2020-02-17: 2 mg via INTRAVENOUS

## 2020-02-17 MED ORDER — LIDOCAINE HCL (PF) 1 % IJ SOLN
INTRAMUSCULAR | Status: DC | PRN
  Administered 2020-02-17: 2 mL

## 2020-02-17 MED ORDER — ATROPINE SULFATE 1 MG/10ML IJ SOSY: 0.3000 mg | PREFILLED_SYRINGE | Freq: Once | INTRAMUSCULAR | Status: AC

## 2020-02-17 MED ORDER — SODIUM CHLORIDE 0.9 % WEIGHT BASED INFUSION
3.0000 mL/kg/h | INTRAVENOUS | Status: AC
  Administered 2020-02-17: 3 mL/kg/h via INTRAVENOUS

## 2020-02-17 MED ORDER — ACETAMINOPHEN 325 MG PO TABS: 650.0000 mg | ORAL_TABLET | ORAL | Status: DC | PRN

## 2020-02-17 MED ORDER — SODIUM CHLORIDE 0.9 % WEIGHT BASED INFUSION: 1.0000 mL/kg/h | INTRAVENOUS | Status: DC

## 2020-02-17 MED ORDER — VERAPAMIL HCL 2.5 MG/ML IV SOLN: INTRAVENOUS | Status: DC | PRN

## 2020-02-17 SURGICAL SUPPLY — 13 items
CATH OPTITORQUE TIG 4.0 5F (CATHETERS) ×2 IMPLANT
CATH SWAN DBL LUMAN 5F 110 (CATHETERS) ×2 IMPLANT
DEVICE RAD COMP TR BAND LRG (VASCULAR PRODUCTS) ×2 IMPLANT
GLIDESHEATH SLEND A-KIT 6F 22G (SHEATH) ×2 IMPLANT
GLIDESHEATH SLEND SS 6F .021 (SHEATH) ×2 IMPLANT
GUIDEWIRE ANGLED .035X150CM (WIRE) ×2 IMPLANT
GUIDEWIRE INQWIRE 1.5J.035X260 (WIRE) ×1 IMPLANT
INQWIRE 1.5J .035X260CM (WIRE) ×2
KIT HEART LEFT (KITS) ×2 IMPLANT
PACK CARDIAC CATHETERIZATION (CUSTOM PROCEDURE TRAY) ×2 IMPLANT
SHEATH GLIDE SLENDER 4/5FR (SHEATH) ×2 IMPLANT
TRANSDUCER W/STOPCOCK (MISCELLANEOUS) ×2 IMPLANT
TUBING CIL FLEX 10 FLL-RA (TUBING) ×2 IMPLANT

## 2020-02-17 NOTE — Discharge Instructions (Signed)
DRINK PLENTY OF FLUIDS FOR THE NEXT 2-3 DAYS.  KEEP ARM ELEVATED THE REMAINDER OF THE DAY.  Radial Site Care  This sheet gives you information about how to care for yourself after your procedure. Your health care provider may also give you more specific instructions. If you have problems or questions, contact your health care provider. What can I expect after the procedure? After the procedure, it is common to have:  Bruising and tenderness at the catheter insertion area. Follow these instructions at home: Medicines  Take over-the-counter and prescription medicines only as told by your health care provider. Insertion site care 1. Follow instructions from your health care provider about how to take care of your insertion site. Make sure you: ? Wash your hands with soap and water before you change your bandage (dressing). If soap and water are not available, use hand sanitizer. ? Change your dressing as told by your health care provider. 2. Check your insertion site every day for signs of infection. Check for: ? Redness, swelling, or pain. ? Fluid or blood. ? Pus or a bad smell. ? Warmth. 3. Do not take baths, swim, or use a hot tub for 5 days. 4. You may shower 24-48 hours after the procedure. ? Remove the dressing and gently wash the site with plain soap and water. ? Pat the area dry with a clean towel. ? Do not rub the site. That could cause bleeding. 5. Do not apply powder or lotion to the site. Activity  1. For 24 hours after the procedure, or as directed by your health care provider: ? Do not flex or bend the affected arm. ? Do not push or pull heavy objects with the affected arm. ? Do not drive yourself home from the hospital or clinic. You may drive 24 hours after the procedure. ? Do not operate machinery or power tools. 2. Do not push, pull or lift anything that is heavier than 10 lb for 5 days. 3. Ask your health care provider when it is okay to: ? Return to work or  school. ? Resume usual physical activities or sports. ? Resume sexual activity. General instructions  If the catheter site starts to bleed, raise your arm and put firm pressure on the site. If the bleeding does not stop, get help right away. This is a medical emergency.  If you went home on the same day as your procedure, a responsible adult should be with you for the first 24 hours after you arrive home.  Keep all follow-up visits as told by your health care provider. This is important. Contact a health care provider if:  You have a fever.  You have redness, swelling, or yellow drainage around your insertion site. Get help right away if:  You have unusual pain at the radial site.  The catheter insertion area swells very fast.  The insertion area is bleeding, and the bleeding does not stop when you hold steady pressure on the area.  Your arm or hand becomes pale, cool, tingly, or numb. These symptoms may represent a serious problem that is an emergency. Do not wait to see if the symptoms will go away. Get medical help right away. Call your local emergency services (911 in the [place] Do not drive yourself to the hospital. Summary  After the procedure, it is common to have bruising and tenderness at the site.  Follow instructions from your health care provider about how to take care of your radial site wound. Check   the wound every day for signs of infection.  Do not push, pull or lift anything that is heavier than 10 lb for 5 days.  This information is not intended to replace advice given to you by your health care provider. Make sure you discuss any questions you have with your health care provider. Document Revised: 09/12/2017 Document Reviewed: 09/12/2017 Elsevier Patient Education  2020 [employer] 

## 2020-02-17 NOTE — Progress Notes (Signed)
Per Dr. Einar Gip, patient is to hold Metoprolol and Losartan until seen in office. Patient's daughter Lynelle Smoke was called and instructed.

## 2020-02-17 NOTE — Research (Signed)
[facility] Informed Consent   Subject Name: Nation Cradle Fusilier  Subject met inclusion and exclusion criteria.  The informed consent form, study requirements and expectations were reviewed with the subject and questions and concerns were addressed prior to the signing of the consent form.  The subject verbalized understanding of the trail requirements.  The subject agreed to participate in the [facility] trial and signed the informed consent.  The informed consent was obtained prior to performance of any protocol-specific procedures for the subject.  A copy of the signed informed consent was given to the subject and a copy was placed in the subject's medical record.  Mena Goes. 02/17/2020, 10:05 AM

## 2020-02-18 ENCOUNTER — Encounter (HOSPITAL_COMMUNITY): Payer: Self-pay | Admitting: Cardiology

## 2020-02-18 MED FILL — Verapamil HCl IV Soln 2.5 MG/ML: INTRAVENOUS | Qty: 2 | Status: AC

## 2020-02-19 ENCOUNTER — Ambulatory Visit: Payer: Medicare HMO | Admitting: Cardiology

## 2020-03-05 ENCOUNTER — Encounter: Payer: Self-pay | Admitting: Cardiology

## 2020-03-05 ENCOUNTER — Ambulatory Visit: Payer: Medicare HMO | Admitting: Cardiology

## 2020-03-05 ENCOUNTER — Other Ambulatory Visit: Payer: Self-pay

## 2020-03-05 VITALS — BP 146/93 | HR 94 | Resp 16 | Ht 68.0 in | Wt 156.0 lb

## 2020-03-05 DIAGNOSIS — I350 Nonrheumatic aortic (valve) stenosis: Secondary | ICD-10-CM

## 2020-03-05 DIAGNOSIS — I6522 Occlusion and stenosis of left carotid artery: Secondary | ICD-10-CM

## 2020-03-05 DIAGNOSIS — I7121 Aneurysm of the ascending aorta, without rupture: Secondary | ICD-10-CM

## 2020-03-05 DIAGNOSIS — I10 Essential (primary) hypertension: Secondary | ICD-10-CM

## 2020-03-05 NOTE — Progress Notes (Signed)
[ Primary Physician/Referring:  Timoteo Gaul, FNP  Patient ID: Kurt Allen, male    DOB: 06-01-1946, 74 y.o.   MRN: [id number]  No chief complaint on file.  HPI:    Kurt Allen  is a 74 y.o. Caucasian male with hypertension, tobacco use disorder, severe aortic valve stenosis confirmed by cardiac catheterization on 02/17/2020 without significant coronary artery disease, a 5.1 cm ascending aortic aneurysm noted during low-dose CT scan of the chest for lung cancer screening, abnormal lung mass and needs further follow-up.  He has 100+ year history of tobacco use, started smoking when he is 74 years of age and has been smoking about 2 packs cigarettes a day, since he was told to have an aneurysm in May 2021, he has reduced it to one fourth of a pack and is on the verge of quitting, now smoking 2 cigarettes a day.   Except for chronic dyspnea denies palpitations, pain, claudication.  On his last office visit a month ago, I had ordered echocardiogram and carotid artery duplex and he presents for follow-up.  No new symptomatology.  Past Medical History:  Diagnosis Date  . Hyperlipidemia   . Hypertension    Past Surgical History:  Procedure Laterality Date  . RIGHT HEART CATH AND CORONARY ANGIOGRAPHY N/A 02/17/2020   Procedure: RIGHT HEART CATH AND CORONARY ANGIOGRAPHY;  Surgeon: Adrian Prows, MD;  Location: [facility];  Service: Cardiovascular;  Laterality: N/A;   History reviewed. No pertinent family history.  Social History   Tobacco Use  . Smoking status: Current Every Day Smoker    Packs/day: 0.25    Years: 50.00    Pack years: 12.50    Types: Cigarettes  . Smokeless tobacco: Never Used  Substance Use Topics  . Alcohol use: Yes    Alcohol/week: 10.0 standard drinks    Types: 10 Cans of beer per week    Comment: per week   Marital Status: Divorced  ROS  Review of Systems  Cardiovascular: Positive for dyspnea on exertion. Negative for chest pain and leg  swelling.  Gastrointestinal: Negative for melena.   Objective  Blood pressure (!) 146/93, pulse 94, resp. rate 16, height _0  (1.727 m), weight 156 lb (70.8 kg), SpO2 97 %.  Vitals with BMI 03/05/2020 02/17/2020 02/17/2020  Height _1  - -  Weight 156 lbs - -  BMI 85.63 - -  Systolic 149 702 90  Diastolic 93 70 68  Pulse 94 51 53     Physical Exam Constitutional:      Appearance: He is well-developed.  Cardiovascular:     Rate and Rhythm: Normal rate.     Pulses:          Carotid pulses are on the left side with bruit.      Femoral pulses are 2+ on the right side.      Popliteal pulses are 2+ on the right side and 2+ on the left side.       Dorsalis pedis pulses are 1+ on the right side and 2+ on the left side.       Posterior tibial pulses are 2+ on the right side and 2+ on the left side.     Heart sounds: Murmur heard.  Harsh midsystolic murmur is present with a grade of 3/6 at the upper right sternal border.      Comments: S1 is normal, S2 is muffled. Pulmonary:     Effort: Pulmonary effort is normal.  Breath sounds: Normal breath sounds.  Abdominal:     General: Bowel sounds are normal.     Palpations: Abdomen is soft.    Laboratory examination:   Recent Labs    02/02/20 0957 02/02/20 0957 02/11/20 0943 02/17/20 1132 02/17/20 1143  NA 136   < > 135 134* 135  K 4.7   < > 4.3 3.9 3.7  CL 98  --  96  --   --   CO2 23  --  26  --   --   GLUCOSE 95  --  111*  --   --   BUN 9  --  10  --   --   CREATININE 1.02  --  1.16  --   --   CALCIUM 9.3  --  9.6  --   --   GFRNONAA 73  --  63  --   --   GFRAA 84  --  72  --   --    < > = values in this interval not displayed.   CrCl cannot be calculated (Patient's most recent lab result is older than the maximum 21 days allowed.).  CMP Latest Ref Rng & Units 02/17/2020 02/17/2020 02/11/2020  Glucose 65 - 99 mg/dL - - 111(H)  BUN 8 - 27 mg/dL - - 10  Creatinine 0.76 - 1.27 mg/dL - - 1.16  Sodium 135 - 145 mmol/L 135  134(L) 135  Potassium 3.5 - 5.1 mmol/L 3.7 3.9 4.3  Chloride 96 - 106 mmol/L - - 96  CO2 20 - 29 mmol/L - - 26  Calcium 8.6 - 10.2 mg/dL - - 9.6  Total Protein 6.0 - 8.5 g/dL - - -  Total Bilirubin 0.0 - 1.2 mg/dL - - -  Alkaline Phos 48 - 121 IU/L - - -  AST 0 - 40 IU/L - - -  ALT 0 - 44 IU/L - - -   CBC Latest Ref Rng & Units 02/17/2020 02/17/2020 02/11/2020  WBC 3.4 - 10.8 x10E3/uL - - 5.5  Hemoglobin 13.0 - 17.0 g/dL 13.6 14.3 15.8  Hematocrit 39 - 52 % 40.0 42.0 46.4  Platelets 150 - 450 x10E3/uL - - 180   Lipid Panel     Component Value Date/Time   CHOL 133 02/02/2020 0956   TRIG 57 02/02/2020 0956   HDL 69 02/02/2020 0956   LDLCALC 52 02/02/2020 0956   HEMOGLOBIN A1C No results found for: HGBA1C, MPG TSH No results for input(s): TSH in the last 8760 hours.  External labs:  Recent labs:  10/28/2019: Glucose 99, BUN/Cr 12/1.05. EGFR 71. Na/K 135/4.6. Rest of the CMP normal  H/H 16.4/48.6. MCV 91.2. Platelets 240  HbA1C 5.2%  Chol 179, TG 72, HDL 59, LDL 104  TSH 1.39 normal  Medications and allergies   No Known Allergies   Current Outpatient Medications  Medication Instructions  . albuterol (VENTOLIN HFA) 108 (90 Base) MCG/ACT inhaler 1 puff, Inhalation, Every 4 hours PRN  . aspirin EC 81 mg, Oral, Daily  . fluticasone (FLONASE) 50 MCG/ACT nasal spray 1 spray, Each Nare, Daily PRN  . losartan-hydrochlorothiazide (HYZAAR) 50-12.5 MG tablet 1 tablet, Oral,  Every morning - 10a  . metoprolol succinate (TOPROL-XL) 25 mg, Oral, Daily  . nicotine (NICODERM CQ - DOSED IN MG/24 HOURS) 21 mg, Transdermal, Daily  . rosuvastatin (CRESTOR) 5 mg, Oral, Daily  . tamsulosin (FLOMAX) 0.4 mg, Oral, Daily  . Vitamin D3 50,000 Units, Oral, Weekly, Friday   Radiology:  CT Chest Lung 12/25/2019: 1. 8.2 mm irregular opacity anterior right lung apex, potentially scarring. Lung-RADS 4A, suspicious. Follow up low-dose chest CT without contrast in 3 months (please use the  following order, "CT CHEST LCS NODULE FOLLOW-UP W/O CM") is recommended.  Alternatively, PET may be considered when there is a solid component 70m or larger. 2. 5.1 cm diameter ascending thoracic aorta with dense calcification of the aortic valve. Aortic stenosis would be a consideration. Ascending thoracic aortic aneurysm. Recommend semi-annual imaging followup by CTA or MRA and referral to cardiothoracic surgery if not already obtained. This recommendation follows 2010 ACCF/AHA/AATS/ACR/ASA/SCA/SCAI/SIR/STS/SVM Guidelines for the Diagnosis and Management of Patients With Thoracic Aortic Disease. Circulation. 2010; 121:: C789-F810   3. Emphysema (ICD10-J43.9) and Aortic Atherosclerosis.  Cardiac Studies:   Abdominal Aortic Duplex 01/13/2020:  The maximum aorta (sac) diameter is 2.22 cm (mid). Mild ectasia noted.  Diffuse plaque observed in the proximal, mid and distal aorta. Normal flow  velocities noted in the aorta and iliac arteries.  No AAA noted. Consider recheck in 10 years for progression to AAA.  Carotid artery duplex 017/51/0258  Peak systolic velocities in the right bifurcation, internal, external and  common carotid arteries are within normal limits.  Stenosis in the left internal carotid artery (16-49%). However left vessel  is tortuous, bruit and elevated velocity may be related to geometry.  Antegrade right vertebral artery flow. Antegrade left vertebral artery  Flow.  Echocardiogram 01/13/2020:  Left ventricle cavity is normal in size. Moderate concentric hypertrophy  of the left ventricle. Normal global wall motion. Normal LV systolic  function with visual EF 50-55%. Indeterminate diastolic filling pattern.  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 Severe  annular and leaflet calcification.  Severe aortic stenosis. Aortic valve mean gradient of 44 mmHg, Vmax of 4.1  m/s. Calculated aortic valve area by continuity equation is 0.5 cm.  Mild (Grade I)  aortic regurgitation.  Mild (Grade I) mitral regurgitation.  Inadequate TR jet to estimate pulmonary artery systolic pressure. Normal  right atrial pressure.  Carotid artery duplex 052/77/8242  Peak systolic velocities in the right bifurcation, internal, external and common carotid arteries are within normal limits.  Stenosis in the left internal carotid artery (16-49%). However left vessel is tortuous, bruit and elevated velocity may be related to geometry.  Antegrade right vertebral artery flow. Antegrade left vertebral artery flow.  Follow up in one year is appropriate if clinically indicated.  ABI 02/12/2020:  This exam reveals normal perfusion of the lower extremity (Bilateral ABI  1.00) with normal triphasic waveform pattern.   Left and right heart catheterization 02/17/2020: Normal coronary arteries with right dominant circulation. Normal right heart cardiac catheterization, preserved cardiac output and cardiac index at 4.22 and 2.29 respectively.  Recommendation: Patient will need aortic valve replacement for severe aortic stenosis, also has ascending aortic aneurysm needs to be addressed.  Will make referral for surgical team to evaluate  EKG  EKG 01/07/2020: Normal sinus rhythm at rate of 70 bpm, left atrial enlargement, otherwise normal EKG.   Assessment     ICD-10-CM   1. Severe aortic stenosis  I35.0 Ambulatory referral to Cardiothoracic Surgery  2. Ascending aortic aneurysm (HCC)  I71.2 Ambulatory referral to Cardiothoracic Surgery    metoprolol succinate (TOPROL-XL) 50 MG 24 hr tablet  3. Asymptomatic stenosis of left carotid artery  I65.22   4. Primary hypertension  I10 metoprolol succinate (TOPROL-XL) 50 MG 24 hr tablet    No orders of the defined  types were placed in this encounter.   Medications Discontinued During This Encounter  Medication Reason  . metoprolol succinate (TOPROL-XL) 50 MG 24 hr tablet Reorder    Recommendations:   Kurt Allen  is  a  74 y.o. Caucasian male with hypertension, tobacco use disorder, severe aortic valve stenosis confirmed by cardiac catheterization on 02/17/2020 without significant coronary artery disease, a 5.1 cm ascending aortic aneurysm noted during low-dose CT scan of the chest for lung cancer screening, abnormal lung mass and needs further follow-up.  He has reduced it to one fourth of a pack and is on the verge of quitting, now smoking 2 cigarettes a day. He has reduced it to one fourth of a pack and is on the verge of quitting, now smoking 2 cigarettes a day.   I have encouraged him to be completely abstinent from tobacco.  I have reviewed the results of the carotid artery duplex and ABI and also cardiac catheterization with the patient and his daughter at the bedside.  I had referred him to be evaluated by pulmonary medicine 2 weeks ago but patient has not heard back, we will place a call and follow-up on this.  His lung mass is also a concern and may need further follow-up as well.  I would refer him to be evaluated for management of severe aortic stenosis and also ascending aortic aneurysm, referral made to Dr. Darylene Price.  With regard to hypertension, while he was in the hospital after cardiac catheterization he had become very hypotensive and hence beta-blocker and losartan HCT was discontinued, blood pressure is trending up and also in view of aortic aneurysm, I will restart back metoprolol succinate 50 mg daily, if he tolerates this we will consider addition of ARB.  Do not want to make him hypotensive in view of severe aortic stenosis.  I will see him back in 6 weeks for follow-up.   Adrian Prows, MD, [facility] 03/05/2020, 5:55 PM Office: [phone]

## 2020-03-10 ENCOUNTER — Encounter: Payer: Self-pay | Admitting: Pulmonary Disease

## 2020-03-10 ENCOUNTER — Ambulatory Visit: Payer: Medicare HMO | Admitting: Pulmonary Disease

## 2020-03-10 ENCOUNTER — Other Ambulatory Visit: Payer: Self-pay

## 2020-03-10 DIAGNOSIS — R911 Solitary pulmonary nodule: Secondary | ICD-10-CM | POA: Insufficient documentation

## 2020-03-10 DIAGNOSIS — J432 Centrilobular emphysema: Secondary | ICD-10-CM | POA: Diagnosis not present

## 2020-03-10 DIAGNOSIS — I350 Nonrheumatic aortic (valve) stenosis: Secondary | ICD-10-CM | POA: Diagnosis not present

## 2020-03-10 DIAGNOSIS — Z9189 Other specified personal risk factors, not elsewhere classified: Secondary | ICD-10-CM

## 2020-03-10 DIAGNOSIS — R918 Other nonspecific abnormal finding of lung field: Secondary | ICD-10-CM

## 2020-03-10 NOTE — Assessment & Plan Note (Signed)
Cardiac surgical evaluation is planned for ASA and ascending aortic aneurysm

## 2020-03-10 NOTE — Addendum Note (Signed)
Addended by: Satira Sark D on: 03/10/2020 12:50 PM   Modules accepted: Orders

## 2020-03-10 NOTE — Assessment & Plan Note (Addendum)
Schedule PFTs to assess lung function. Continue albuterol for now this seems to be enough.  He is not overly symptomatic with dyspnea, if significant airway obstruction, then will add LABA/LAMA combination  Smoking cessation was emphasized is the most important intervention that will add years to his life

## 2020-03-10 NOTE — Patient Instructions (Signed)
  You have emphysema on the CT scan. Schedule PFTs.  Schedule 3 month follow-up low-dose CT for right upper lobe nodule in in August

## 2020-03-10 NOTE — Assessment & Plan Note (Signed)
Given history of smoking, he is moderate risk.  Nodule characteristic itself is not definitive but favor scarring. Obtain 61-month follow-up CT, if any growth in size, then would proceed with PET scan

## 2020-03-10 NOTE — Addendum Note (Signed)
Addended by: Satira Sark D on: 03/10/2020 11:33 AM   Modules accepted: Orders

## 2020-03-10 NOTE — Progress Notes (Signed)
Subjective:    Patient ID: Kurt Allen, male    DOB: [birth year], 74 y.o.   MRN: [id number]  HPI  74 year old smoker referred by cardiology for evaluation of emphysema and finding of lung nodule on low-dose CT.  He underwent low-dose CT chest based on his history of smoking on his PCP advised.  This showed centrilobular emphysema and right upper lobe 8 mm opacity.  This also picked up 5.1 cm ascending thoracic aortic aneurysm. He subsequently underwent cardiac evaluation including echo and cardiac cath which confirmed severe aortic stenosis and no significant CAD.  He started smoking at age 59, maximum of 2 packs/day, more than 100 pack years and has recently cut down to 2 cigarettes daily with using nicotine patches. His only complaint is mild shortness of breath on unusual activity.  He can walk for long distance at his slow pace.  In fact he prefers taking the stairs to the elevator. He reports an episode of bronchitis in February 2020 but otherwise his wheezing and coughing is decreased significantly after he cut down his cigarettes.  He was given albuterol MDI and this seems to be sufficient for him  Significant tests/ events reviewed LDCT 12/2019 >> 8.2 mm irregular opacity anterior right lung apex , 5.1 cm diameter ascending thoracic aorta with dense calcification of the aortic valve  Echo 12/2019-severe AS, peak gradient 44 mm LHC 01/2020 normal coronaries    Past Medical History:  Diagnosis Date  . Hyperlipidemia   . Hypertension    Past Surgical History:  Procedure Laterality Date  . RIGHT HEART CATH AND CORONARY ANGIOGRAPHY N/A 02/17/2020   Procedure: RIGHT HEART CATH AND CORONARY ANGIOGRAPHY;  Surgeon: Adrian Prows, MD;  Location: [facility];  Service: Cardiovascular;  Laterality: N/A;    No Known Allergies    Social History   Socioeconomic History  . Marital status: Divorced    Spouse name: Not on file  . Number of children: 2  . Years of  education: Not on file  . Highest education level: Not on file  Occupational History  . Not on file  Tobacco Use  . Smoking status: Current Every Day Smoker    Packs/day: 0.25    Years: 50.00    Pack years: 12.50    Types: Cigarettes  . Smokeless tobacco: Never Used  Vaping Use  . Vaping Use: Former  . Start date: 08/21/2016  . Quit date: 09/21/2016  Substance and Sexual Activity  . Alcohol use: Yes    Alcohol/week: 10.0 standard drinks    Types: 10 Cans of beer per week    Comment: per week  . Drug use: Not Currently  . Sexual activity: Not Currently  Other Topics Concern  . Not on file  Social History Narrative  . Not on file   Social Determinants of Health   Financial Resource Strain:   . Difficulty of Paying Living Expenses:   Food Insecurity:   . Worried About Charity fundraiser in the Last Year:   . Arboriculturist in the Last Year:   Transportation Needs:   . Film/video editor (Medical):   Marland Kitchen Lack of Transportation (Non-Medical):   Physical Activity:   . Days of Exercise per Week:   . Minutes of Exercise per Session:   Stress:   . Feeling of Stress :   Social Connections:   . Frequency of Communication with Friends and Family:   . Frequency of Social Gatherings with Friends  and Family:   . Attends Religious Services:   . Active Member of Clubs or Organizations:   . Attends Archivist Meetings:   Marland Kitchen Marital Status:   Intimate Partner Violence:   . Fear of Current or Ex-Partner:   . Emotionally Abused:   Marland Kitchen Physically Abused:   . Sexually Abused:      History reviewed. No pertinent family history.    Review of Systems Shortness of breath with activity  No chest pain, syncope No back pain No weight loss, fevers, loss of appetite    Objective:   Physical Exam  Gen. Pleasant, well-nourished, in no distress, normal affect ENT - no pallor,icterus, no post nasal drip Neck: No JVD, no thyromegaly, no carotid bruits Lungs: no use of  accessory muscles, no dullness to percussion, clear without rales or rhonchi  Cardiovascular: Rhythm regular, heart sounds  normal, no murmurs or gallops, no peripheral edema Abdomen: soft and non-tender, no hepatosplenomegaly, BS normal. Musculoskeletal: No deformities, no cyanosis or clubbing Neuro:  alert, non focal       Assessment & Plan:

## 2020-03-22 ENCOUNTER — Other Ambulatory Visit: Payer: Self-pay | Admitting: Thoracic Surgery (Cardiothoracic Vascular Surgery)

## 2020-03-22 ENCOUNTER — Encounter: Payer: Self-pay | Admitting: Thoracic Surgery (Cardiothoracic Vascular Surgery)

## 2020-03-22 ENCOUNTER — Other Ambulatory Visit: Payer: Self-pay

## 2020-03-22 ENCOUNTER — Other Ambulatory Visit: Payer: Self-pay | Admitting: *Deleted

## 2020-03-22 ENCOUNTER — Institutional Professional Consult (permissible substitution): Payer: Medicare HMO | Admitting: Thoracic Surgery (Cardiothoracic Vascular Surgery)

## 2020-03-22 VITALS — BP 145/95 | HR 70 | Temp 97.9°F | Resp 20 | Ht 68.0 in | Wt 156.0 lb

## 2020-03-22 DIAGNOSIS — I350 Nonrheumatic aortic (valve) stenosis: Secondary | ICD-10-CM

## 2020-03-22 DIAGNOSIS — I7121 Aneurysm of the ascending aorta, without rupture: Secondary | ICD-10-CM

## 2020-03-22 DIAGNOSIS — I712 Thoracic aortic aneurysm, without rupture: Secondary | ICD-10-CM | POA: Diagnosis not present

## 2020-03-22 DIAGNOSIS — Z720 Tobacco use: Secondary | ICD-10-CM | POA: Insufficient documentation

## 2020-03-22 NOTE — Patient Instructions (Addendum)
Stop smoking immediately and permanently.  Stop taking aspirin  Continue taking all other medications without change through the day before surgery.  Make sure to bring all of your medications with you when you come for your Pre-Admission Testing appointment at [facility] Short-Stay Department.  Have nothing to eat or drink after midnight the night before surgery.  On the morning of surgery do not take any medications.  You may use your inhaler if desired.  At your appointment for Pre-Admission Testing at the [facility] Short-Stay Department you will be asked to sign permission forms for your upcoming surgery.  By definition your signature on these forms implies that you and/or your designee provide full informed consent for your planned surgical procedure(s), that alternative treatment options have been discussed, that you understand and accept any and all potential risks, and that you have some understanding of what to expect for your post-operative convalescence.  For any major cardiac surgical procedure potential operative risks include but are not limited to at least some risk of death, stroke or other neurologic complication, myocardial infarction, congestive heart failure, respiratory failure, renal failure, bleeding requiring blood transfusion and/or reexploration, irregular heart rhythm, heart block or bradycardia requiring permanent pacemaker, pneumonia, pericardial effusion, pleural effusion, wound infection, pulmonary embolus or other thromboembolic complication, chronic pain, or other complications related to the specific procedure(s) performed.  Please call to schedule a follow-up appointment in our office prior to surgery if you have any unresolved questions about your planned surgical procedure, the associated risks, alternative treatment options, and/or expectations for your post-operative recovery.

## 2020-03-22 NOTE — Progress Notes (Signed)
HEART AND [place] [facility]GERY CONSULTATION REPORT  Referring Provider is Adrian Prows, MD PCP is Timoteo Gaul, FNP  Chief Complaint  Patient presents with  . Thoracic Aortic Aneurysm    new patient consultation, CATH 02/17/20, ECHO 01/13/20  . Aortic Stenosis    HPI:  Patient is 74 year old male with longstanding history of heart murmur and tobacco abuse who has been referred for surgical consultation to discuss treatment options for management of recently discovered severe symptomatic aortic stenosis with aneurysm involving the ascending thoracic aorta.  Patient states that he was first told that he had a heart murmur as a teenager but he has never had a formal cardiac evaluation until recently.  He has a longstanding history of tobacco abuse and underwent low-dose screening CT scan without contrast to rule out the possibility of lung cancer.  CT scan revealed findings consistent with centrilobular and paraseptal emphysema as well as an 8.2 mm opacity at the apex of the right lung that was possibly consistent with scarring but worthy of follow-up.  CT scan also revealed dilated ascending thoracic aorta which measured 5.1 cm in its greatest diameter.  The patient was referred for cardiac evaluation and seen by Dr. Einar Gip who noted a prominent systolic murmur on physical exam.  Transthoracic echocardiogram performed in his office revealed normal left ventricular systolic function with severe aortic stenosis.  Peak velocity across aortic valve was measured 4.1 m/s corresponding to mean transvalvular gradient estimated 44 mmHg.  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  Diagnostic cardiac catheterization performed February 17, 2020 revealed normal coronary artery anatomy with no significant coronary artery disease.  Right heart pressures and hemodynamics were normal.   Cardiothoracic surgical consultation was requested.  Patient is separated and lives with his friend in [place].  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  He has remained reasonably active physically and retirement and is entirely functionally independent.  He admits to a several year history of symptoms of exertional shortness of breath and fatigue.  The patient only gets short of breath with moderate level activity and always recovers quickly when he stops to rest.  He has had occasional slight dizzy spells and approximately 2 years ago had a brief syncopal episode.  He denies any chest pain either with activity or at rest.  He denies any history of resting shortness of breath, PND, orthopnea, or lower extremity edema.  He has a chronic dry cough that is nonproductive.  He was treated for bronchitis and possible pneumonia last February.  He has a longstanding history of tobacco abuse dating back more than 60 years.  He currently smokes less than 1/2 pack cigarettes daily but in the past he has smoked as much as 2 packs a day.  Past Medical History:  Diagnosis Date  . Centrilobular emphysema ([place])   . Hyperlipidemia   . Hypertension   . Severe aortic stenosis   . Thoracic ascending aortic aneurysm ([place])   . Tobacco abuse     Past Surgical History:  Procedure Laterality Date  . RIGHT HEART CATH AND CORONARY ANGIOGRAPHY N/A 02/17/2020   Procedure: RIGHT HEART CATH AND CORONARY ANGIOGRAPHY;  Surgeon: Adrian Prows, MD;  Location: [facility];  Service: Cardiovascular;  Laterality: N/A;    History reviewed. No pertinent family history.  Social  History   Socioeconomic History  . Marital status: Divorced    Spouse name: Not on file  . Number of children: 2  . Years of education: Not on file  . Highest education level: Not on file  Occupational History  . Not on file  Tobacco Use  . Smoking status:  Current Every Day Smoker    Packs/day: 0.25    Years: 50.00    Pack years: 12.50    Types: Cigarettes  . Smokeless tobacco: Never Used  Vaping Use  . Vaping Use: Former  . Start date: 08/21/2016  . Quit date: 09/21/2016  Substance and Sexual Activity  . Alcohol use: Yes    Alcohol/week: 10.0 standard drinks    Types: 10 Cans of beer per week    Comment: per week  . Drug use: Not Currently  . Sexual activity: Not Currently  Other Topics Concern  . Not on file  Social History Narrative  . Not on file   Social Determinants of Health   Financial Resource Strain:   . Difficulty of Paying Living Expenses:   Food Insecurity:   . Worried About Charity fundraiser in the Last Year:   . Arboriculturist in the Last Year:   Transportation Needs:   . Film/video editor (Medical):   Marland Kitchen Lack of Transportation (Non-Medical):   Physical Activity:   . Days of Exercise per Week:   . Minutes of Exercise per Session:   Stress:   . Feeling of Stress :   Social Connections:   . Frequency of Communication with Friends and Family:   . Frequency of Social Gatherings with Friends and Family:   . Attends Religious Services:   . Active Member of Clubs or Organizations:   . Attends Archivist Meetings:   Marland Kitchen Marital Status:   Intimate Partner Violence:   . Fear of Current or Ex-Partner:   . Emotionally Abused:   Marland Kitchen Physically Abused:   . Sexually Abused:     Current Outpatient Medications  Medication Sig Dispense Refill  . albuterol (VENTOLIN HFA) 108 (90 Base) MCG/ACT inhaler Inhale 1 puff into the lungs every 4 (four) hours as needed for wheezing or shortness of breath.     Marland Kitchen aspirin EC 81 MG tablet Take 81 mg by mouth daily.    . Cholecalciferol (VITAMIN D3) 1.25 MG (50000 UT) CAPS Take 50,000 Units by mouth once a week. Friday    . fluticasone (FLONASE) 50 MCG/ACT nasal spray Place 1 spray into both nostrils daily as needed for rhinitis.     Marland Kitchen losartan-hydrochlorothiazide  (HYZAAR) 50-12.5 MG tablet Take 1 tablet by mouth every morning. 30 tablet 2  . metoprolol succinate (TOPROL-XL) 50 MG 24 hr tablet Take 0.5 tablets (25 mg total) by mouth daily.    . nicotine (NICODERM CQ - DOSED IN MG/24 HOURS) 21 mg/24hr patch Place 21 mg onto the skin daily.     . rosuvastatin (CRESTOR) 10 MG tablet Take 0.5 tablets (5 mg total) by mouth daily. 30 tablet 2  . tamsulosin (FLOMAX) 0.4 MG CAPS capsule Take 0.4 mg by mouth daily.      No current facility-administered medications for this visit.    No Known Allergies    Review of Systems:   General:  normal appetite, normal energy, no weight gain, no weight loss, no fever  Cardiac:  no chest pain with exertion, no chest pain at rest, +SOB with exertion, no resting SOB, no  PND, no orthopnea, no palpitations, no arrhythmia, no atrial fibrillation, no LE edema, occasional dizzy spells, one episode syncope approx 2 years ago  Respiratory:  + shortness of breath, no home oxygen, no productive cough, + chronic dry cough, + bronchitis, no wheezing, no hemoptysis, no asthma, no pain with inspiration or cough, no sleep apnea, no CPAP at night  GI:   no difficulty swallowing, no reflux, no frequent heartburn, no hiatal hernia, no abdominal pain, no constipation, no diarrhea, no hematochezia, no hematemesis, no melena  GU:   no dysuria,  no frequency, no urinary tract infection, no hematuria, no enlarged prostate, no kidney stones, no kidney disease  Vascular:  no pain suggestive of claudication, no pain in feet, no leg cramps, no varicose veins, no DVT, no non-healing foot ulcer  Neuro:   no stroke, no TIA's, no seizures, no headaches, no temporary blindness one eye,  no slurred speech, no peripheral neuropathy, no chronic pain, no instability of gait, no memory/cognitive dysfunction  Musculoskeletal: mild arthritis, no joint swelling, no myalgias, no difficulty walking, normal mobility   Skin:   no rash, no itching, no skin infections,  no pressure sores or ulcerations  Psych:   no anxiety, no depression, no nervousness, no unusual recent stress  Eyes:   no blurry vision, no floaters, no recent vision changes, + wears glasses or contacts  ENT:   no hearing loss, no loose or painful teeth, edentulous with full dentures  Hematologic:  + easy bruising, no abnormal bleeding, no clotting disorder, no frequent epistaxis  Endocrine:  no diabetes, does not check CBG's at home           Physical Exam:   BP (!) 145/95   Pulse 70   Temp 97.9 F (36.6 C) (Oral)   Resp 20   Ht 5\' 8"  (1.727 m)   Wt 156 lb (70.8 kg)   SpO2 93% Comment: RA  BMI 23.72 kg/m   General:  Thin,  well-appearing  HEENT:  Unremarkable   Neck:   no JVD, no bruits, no adenopathy   Chest:   clear to auscultation, distant but symmetrical breath sounds, no wheezes, no rhonchi   CV:   RRR, grade III/VI crescendo/decrescendo murmur heard best at RSB,  no diastolic murmur  Abdomen:  soft, non-tender, no masses   Extremities:  warm, well-perfused, pulses palpable, no LE edema  Rectal/GU  Deferred  Neuro:   Grossly non-focal and symmetrical throughout  Skin:   Clean and dry, no rashes, no breakdown   Diagnostic Tests:  CT CHEST WITHOUT CONTRAST LOW-DOSE FOR LUNG CANCER SCREENING  TECHNIQUE: Multidetector CT imaging of the chest was performed following the standard protocol without IV contrast.  COMPARISON:  None.  FINDINGS: Cardiovascular: The heart size is normal. No substantial pericardial effusion. Coronary artery calcification is evident. Prominent calcification noted of the aortic valve. Ascending thoracic aorta measures 5.1 cm diameter.  Mediastinum/Nodes: No mediastinal lymphadenopathy. No evidence for gross hilar lymphadenopathy although assessment is limited by the lack of intravenous contrast on today's study. Tiny hiatal hernia. The esophagus has normal imaging features. There is no axillary lymphadenopathy.  Lungs/Pleura:  Centrilobular and paraseptal emphysema evident. Scattered tiny bilateral pulmonary nodules identified. 8.2 mm irregular right anterior apical opacity may be related to scarring. No focal airspace consolidation. No pleural effusion.  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  Musculoskeletal: No worrisome lytic or sclerotic osseous abnormality.  IMPRESSION: 1. 8.2 mm irregular opacity anterior right lung  apex, potentially scarring. Lung-RADS 4A, suspicious. Follow up low-dose chest CT without contrast in 3 months (please use the following order, "CT CHEST LCS NODULE FOLLOW-UP W/O CM") is recommended. Alternatively, PET may be considered when there is a solid component 48mm or larger. 2. 5.1 cm diameter ascending thoracic aorta with dense calcification of the aortic valve. Aortic stenosis would be a consideration. Ascending thoracic aortic aneurysm. Recommend semi-annual imaging followup by CTA or MRA and referral to cardiothoracic surgery if not already obtained. This recommendation follows 2010 ACCF/AHA/AATS/ACR/ASA/SCA/SCAI/SIR/STS/SVM Guidelines for the Diagnosis and Management of Patients With Thoracic Aortic Disease. Circulation. 2010; 121: G401-U272. Aortic aneurysm NOS (ICD10-I71.9) 3.  Emphysema (ICD10-J43.9) and Aortic Atherosclerosis (ICD10-170.0)   Electronically Signed   By: Misty Stanley M.D.   On: 12/26/2019 12:22   Echocardiogram 01/13/2020:  Left ventricle cavity is normal in size. Moderate concentric hypertrophy  of the left ventricle. Normal global wall motion. Normal LV systolic  function with visual EF 50-55%. Indeterminate diastolic filling pattern.  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 Severe  annular and leaflet calcification.  Severe aortic stenosis. Aortic valve mean gradient of 44 mmHg, Vmax of 4.1  m/s. Calculated aortic valve area by continuity equation is 0.5 cm.  Mild (Grade I) aortic  regurgitation.  Mild (Grade I) mitral regurgitation.  Inadequate TR jet to estimate pulmonary artery systolic pressure. Normal  right atrial pressure.    RIGHT HEART CATH AND CORONARY ANGIOGRAPHY  Conclusion  Left and right heart catheterization 02/17/2020: Normal coronary arteries with right dominant circulation. Normal right heart cardiac catheterization, preserved cardiac output and cardiac index at 4.22 and 2.29 respectively.  Recommendation: Patient will need aortic valve replacement for severe aortic stenosis, also has ascending aortic aneurysm needs to be addressed.  Will make referral for surgical team to evaluate. Indications  Nonrheumatic aortic valve stenosis [I35.0 (ICD-10-CM)]  Procedural Details  Technical Details Procedures performed: Right and left heart catheterization and occlusion of cardiac output and cardiac index by Fick.  Right radial arterial access and right antecubital vein access was utilized for performing the procedure.   Indication: Josias Tomerlin  is a 74 y.o. male  presenting with shortness of breath, has severe aortic stenosis by physical exam and by echocardiogram, also has ascending aortic aneurysm measuring 5 cm. Hence is brought to the cardiac catheterization lab to evaluate  coronary anatomy. Right heart catheterization being performed for evaluation of dyspnea and pulmonary hypertension and to evaluate cardiac output and cardiac index.  A 6 French  sheath introduced into the right antecubital vein access. A 5 French Swan-Ganz catheter was advanced with balloon inflated on the sheath under fluoroscopic guidance into first the right atrium followed by the right ventricle and into the pulmonary artery to pulmonary artery wedge position. Hemodynamics were obtained in a locations.  After hemodynamics were completed, samples were taken for SaO2% measurement to be used in [facility] /Index catheterization.  The catheter was then pulled back the balloon down  and then completely out of the body.  Hemostasis was achieved by applying manual pressure.  Left Heart Catheterization   A 6 F sheath was introduced into the right radial artery. Using safety J-wire, a 80F Tiger 4.0 catheter was advanced over standard J-wire into the ascending aorta and used to engage first the Left and Right Coronary Artery. Multiple cineangiographic views of the Left then Right Coronary Artery system(s) were performed.   The catheter was then removed the body over wire. All exchanges were  made over standard J wire. Hemostasis was achieved by applying TR band and manual pressure at the venous access site. Contrast used 35 mL Estimated blood loss <50 mL.   During this procedure medications were administered to achieve and maintain moderate conscious sedation while the patient's heart rate, blood pressure, and oxygen saturation were continuously monitored and I was present face-to-face 100% of this time.  Medications (Filter: Administrations occurring from 1057 to 1201 on 02/17/20) (important) Continuous medications are totaled by the amount administered until 02/17/20 1201.  Heparin (Porcine) in NaCl 1000-0.9 UT/500ML-% SOLN (mL) Total volume:  500 mL Date/Time  Rate/Dose/Volume Action  02/17/20 1106  500 mL Given    Heparin (Porcine) in NaCl 1000-0.9 UT/500ML-% SOLN (mL) Total volume:  500 mL Date/Time  Rate/Dose/Volume Action  02/17/20 1106  500 mL Given    midazolam (VERSED) injection (mg) Total dose:  2 mg Date/Time  Rate/Dose/Volume Action  02/17/20 1121  2 mg Given    fentaNYL (SUBLIMAZE) injection (mcg) Total dose:  25 mcg Date/Time  Rate/Dose/Volume Action  02/17/20 1121  25 mcg Given    lidocaine (PF) (XYLOCAINE) 1 % injection (mL) Total volume:  2 mL Date/Time  Rate/Dose/Volume Action  02/17/20 1135  2 mL Given    atropine 1 MG/10ML injection (mg) Total dose:  0.5 mg Date/Time  Rate/Dose/Volume Action  02/17/20 1137  0.5 mg Given    Radial  Cocktail/Verapamil only (mL) Total volume:  0 mL Date/Time  Rate/Dose/Volume Action  02/17/20   Canceled Entry    heparin sodium (porcine) injection (Units) Total dose:  4,000 Units Date/Time  Rate/Dose/Volume Action  02/17/20 1143  4,000 Units Given    iohexol (OMNIPAQUE) 350 MG/ML injection (mL) Total volume:  35 mL Date/Time  Rate/Dose/Volume Action  02/17/20 1201  35 mL Given    Sedation Time  Sedation Time Physician-1: 32 minutes  Contrast  Medication Name Total Dose  iohexol (OMNIPAQUE) 350 MG/ML injection 35 mL    Radiation/Fluoro  Fluoro time: 10 (min) DAP: 14990 (mGycm2) Cumulative Air Kerma: 228 (mGy)  Coronary Findings  Diagnostic Dominance: Right Left Main  Vessel was injected. Vessel is normal in caliber. Vessel is angiographically normal.  Left Anterior Descending  Vessel was injected. Vessel is normal in caliber. Vessel is angiographically normal.  Left Circumflex  Vessel was injected. Vessel is normal in caliber. Vessel is angiographically normal.  Right Coronary Artery  Vessel was injected. Vessel is normal in caliber. Vessel is angiographically normal.  Intervention  No interventions have been documented. Right Heart  Right Heart Pressures Normal right heart pressure and normal wedge pressure. RA 7/5, mean 2; RV 20/0, EDP 3; PA 19/0, mean 11 mmHg. PA saturation 72%, aortic saturation 99%.  CO 4.22, CI 2.29 by Fick.  QP/QS 1.00.  Coronary Diagrams  Diagnostic Dominance: Right  Intervention  Implants   No implant documentation for this case.  Syngo Images  Show images for CARDIAC CATHETERIZATION Images on Long Term Storage  Show images for [place]ance, [place] Link to Procedure Log  Procedure Log    Hemo Data   Most Recent Value  Fick Cardiac Output 4.22 L/min  Fick Cardiac Output Index 2.29 (L/min)/BSA  RA A Wave 7 mmHg  RA V Wave 5 mmHg  RA Mean 2 mmHg  RV Systolic Pressure 20 mmHg  RV Diastolic Pressure 0 mmHg  RV EDP  3 mmHg  PA Systolic Pressure 19 mmHg  PA Diastolic Pressure 0 mmHg  PA Mean 11 mmHg  PW A  Wave 16 mmHg  PW V Wave 15 mmHg  PW Mean 14 mmHg  AO Systolic Pressure 89 mmHg  AO Diastolic Pressure 46 mmHg  AO Mean 65 mmHg  QP/QS 1  TPVR Index 4.8 HRUI  TSVR Index 28.37 HRUI  TPVR/TSVR Ratio 0.17      Impression:  Patient has likely bicuspid aortic valve with stage D1 severe symptomatic aortic stenosis as well as moderate aneurysmal enlargement of the ascending thoracic aorta.  He describes stable symptoms of exertional shortness of breath consistent with chronic diastolic congestive heart failure, New York Heart Association functional class II.  Symptoms may be exacerbated by the presence of significant underlying COPD.  I personally reviewed both report and images from transthoracic echocardiogram performed recently at Dr. Irven Shelling office.  Echocardiogram reveals what appears to be a Sievers type I bicuspid aortic valve with severe aortic stenosis.  There is severely restricted leaflet mobility with moderate to severe thickening and calcification.  Peak velocity across aortic valve measured greater than 4.1 m/s corresponding to mean transvalvular gradient estimated greater than 40 mmHg.  Left ventricular systolic function remains normal.  The aorta appears moderately dilated and recent noncontrast CT scan of the chest was notable for maximum transverse diameter of the ascending aorta measuring approximately 5.1 cm.  Diagnostic cardiac catheterization is notable for the absence of significant coronary artery disease and revealed normal right heart pressures and hemodynamics.  Patient would likely best be treated with concomitant aortic valve replacement with repair of ascending thoracic aorta.  Risks associated with conventional surgery would be relatively low although the patient may be at somewhat increased risk for problems with respiratory failure due to his significant underlying chronic lung  disease.   Plan:  The patient and his 2 daughters were counseled at length regarding treatment alternatives for management of severe symptomatic aortic stenosis with aneurysmal enlargement of the ascending aorta. Alternative approaches such as conventional aortic valve replacement with repair of the aortic aneurysm, transcatheter aortic valve replacement, and continued medical therapy without intervention were compared and contrasted at length.  The risks associated with conventional surgery were discussed in detail, as were expectations for post-operative convalescence, alternative surgical approaches, prosthetic valve choices, and the presence of other concomitant conditions which might require intervention.  Issues specific to transcatheter aortic valve replacement were discussed including questions about long term valve durability, the potential for paravalvular leak, possible increased risk of need for permanent pacemaker placement, the suitability of the patient's surgical anatomy, and other potential technical complications related to the procedure itself.  Long-term prognosis without surgical intervention was discussed.  All treatment options were discussed in the context of the patient's own specific clinical presentation, past medical history, long-term prognosis and life expectancy.  All of their questions have been addressed.  The patient desires to proceed with elective aortic valve replacement and repair of ascending thoracic aorta in the near future.  We tentatively plan to proceed with surgery on April 14, 2020.  As a next step the patient will undergo formal pulmonary function testing as well as cardiac gated CT angiogram of the heart and aorta.  The patient has been instructed to stop smoking completely.  He will return to our office for follow-up prior to surgery on April 12, 2020.     I spent in excess of 90 minutes during the conduct of this office consultation and >50% of this  time involved direct face-to-face encounter with the patient for counseling and/or coordination of their care.  Valentina Gu. Roxy Manns, MD 03/22/2020 2:38 PM

## 2020-03-23 ENCOUNTER — Encounter: Payer: Self-pay | Admitting: *Deleted

## 2020-04-01 ENCOUNTER — Institutional Professional Consult (permissible substitution): Payer: Medicare HMO | Admitting: Pulmonary Disease

## 2020-04-06 ENCOUNTER — Telehealth (HOSPITAL_COMMUNITY): Payer: Self-pay | Admitting: *Deleted

## 2020-04-06 ENCOUNTER — Telehealth: Payer: Self-pay | Admitting: Pulmonary Disease

## 2020-04-06 ENCOUNTER — Other Ambulatory Visit: Payer: Self-pay | Admitting: Thoracic Surgery (Cardiothoracic Vascular Surgery)

## 2020-04-06 DIAGNOSIS — I350 Nonrheumatic aortic (valve) stenosis: Secondary | ICD-10-CM

## 2020-04-06 NOTE — Telephone Encounter (Signed)
Patient is returning phone call. Patient phone number is [phone]. 

## 2020-04-06 NOTE — Telephone Encounter (Signed)
Called and spoke to pt. Informed him of the recs per Wyn Quaker, NP. Pt verbalized understanding and denied any further questions or concerns at this time.

## 2020-04-06 NOTE — Telephone Encounter (Signed)
Would keep as is.  No new recommendations or changes.  Wyn Quaker, FNP

## 2020-04-06 NOTE — Telephone Encounter (Signed)
Reaching out to patient to offer assistance regarding upcoming cardiac imaging study; pt verbalizes understanding of appt date/time, parking situation and where to check in, pre-test NPO status, and verified current allergies; name and call back number provided for further questions should they arise  [facility] [phone] office [phone] cell

## 2020-04-06 NOTE — Telephone Encounter (Signed)
lmtcb for pt.   Pt has three CT scans on 8/18, one of which is a LD CT chest Dr. Elsworth Soho ordered. The other two are CT cardiac and a CT angio chest aorta, ordered by Dr. Ricard Dillon with [facility]. Please advise if the LD scan is still needed. Will send to APP of the day as Dr. Elsworth Soho is unavailable.   Aaron Edelman, please advise. Thanks.

## 2020-04-07 ENCOUNTER — Ambulatory Visit (HOSPITAL_COMMUNITY)
Admission: RE | Admit: 2020-04-07 | Discharge: 2020-04-07 | Disposition: A | Discharge status: Home / Self Care | Payer: Medicare HMO | Source: Ambulatory Visit | Attending: Thoracic Surgery (Cardiothoracic Vascular Surgery) | Admitting: Thoracic Surgery (Cardiothoracic Vascular Surgery)

## 2020-04-07 ENCOUNTER — Encounter (HOSPITAL_COMMUNITY): Payer: Self-pay

## 2020-04-07 ENCOUNTER — Other Ambulatory Visit: Payer: Medicare HMO

## 2020-04-07 ENCOUNTER — Ambulatory Visit
Admission: RE | Admit: 2020-04-07 | Discharge: 2020-04-07 | Disposition: A | Discharge status: Home / Self Care | Payer: Medicare HMO | Source: Ambulatory Visit | Attending: Pulmonary Disease | Admitting: Pulmonary Disease

## 2020-04-07 ENCOUNTER — Other Ambulatory Visit: Payer: Self-pay

## 2020-04-07 DIAGNOSIS — R911 Solitary pulmonary nodule: Secondary | ICD-10-CM

## 2020-04-07 DIAGNOSIS — R918 Other nonspecific abnormal finding of lung field: Secondary | ICD-10-CM

## 2020-04-07 DIAGNOSIS — I7121 Aneurysm of the ascending aorta, without rupture: Secondary | ICD-10-CM

## 2020-04-07 DIAGNOSIS — Z9189 Other specified personal risk factors, not elsewhere classified: Secondary | ICD-10-CM

## 2020-04-07 DIAGNOSIS — I712 Thoracic aortic aneurysm, without rupture: Secondary | ICD-10-CM

## 2020-04-07 DIAGNOSIS — J432 Centrilobular emphysema: Secondary | ICD-10-CM

## 2020-04-07 DIAGNOSIS — I350 Nonrheumatic aortic (valve) stenosis: Secondary | ICD-10-CM

## 2020-04-08 ENCOUNTER — Ambulatory Visit (HOSPITAL_COMMUNITY)
Admission: RE | Admit: 2020-04-08 | Discharge: 2020-04-08 | Disposition: A | Discharge status: Home / Self Care | Payer: Medicare HMO | Source: Ambulatory Visit | Attending: Thoracic Surgery (Cardiothoracic Vascular Surgery) | Admitting: Thoracic Surgery (Cardiothoracic Vascular Surgery)

## 2020-04-08 ENCOUNTER — Ambulatory Visit (HOSPITAL_COMMUNITY): Admission: RE | Admit: 2020-04-08 | Payer: Medicare HMO | Source: Ambulatory Visit

## 2020-04-08 ENCOUNTER — Other Ambulatory Visit: Payer: Self-pay

## 2020-04-08 DIAGNOSIS — I2510 Atherosclerotic heart disease of native coronary artery without angina pectoris: Secondary | ICD-10-CM | POA: Diagnosis not present

## 2020-04-08 DIAGNOSIS — I350 Nonrheumatic aortic (valve) stenosis: Secondary | ICD-10-CM | POA: Diagnosis not present

## 2020-04-08 DIAGNOSIS — J432 Centrilobular emphysema: Secondary | ICD-10-CM | POA: Insufficient documentation

## 2020-04-08 DIAGNOSIS — I700 Atherosclerosis of aorta: Secondary | ICD-10-CM | POA: Diagnosis not present

## 2020-04-08 DIAGNOSIS — D3501 Benign neoplasm of right adrenal gland: Secondary | ICD-10-CM | POA: Diagnosis not present

## 2020-04-08 DIAGNOSIS — I712 Thoracic aortic aneurysm, without rupture: Secondary | ICD-10-CM | POA: Insufficient documentation

## 2020-04-08 DIAGNOSIS — Z01818 Encounter for other preprocedural examination: Secondary | ICD-10-CM | POA: Diagnosis present

## 2020-04-08 DIAGNOSIS — D3502 Benign neoplasm of left adrenal gland: Secondary | ICD-10-CM | POA: Diagnosis not present

## 2020-04-08 LAB — BASIC METABOLIC PANEL
BUN: 11 mg/dL (ref 7–25)
CO2: 28 mmol/L (ref 20–32)
Calcium: 9.1 mg/dL (ref 8.6–10.3)
Chloride: 96 mmol/L — ABNORMAL LOW (ref 98–110)
Creat: 1.04 mg/dL (ref 0.70–1.18)
Glucose, Bld: 98 mg/dL (ref 65–139)
Potassium: 4.1 mmol/L (ref 3.5–5.3)
Sodium: 133 mmol/L — ABNORMAL LOW (ref 135–146)

## 2020-04-08 MED ORDER — IOHEXOL 350 MG/ML SOLN
100.0000 mL | Freq: Once | INTRAVENOUS | Status: AC | PRN
  Administered 2020-04-08: 100 mL via INTRAVENOUS

## 2020-04-12 ENCOUNTER — Ambulatory Visit (HOSPITAL_COMMUNITY)
Admission: RE | Admit: 2020-04-12 | Discharge: 2020-04-12 | Disposition: A | Discharge status: Home / Self Care | Payer: Medicare HMO | Source: Ambulatory Visit | Attending: Thoracic Surgery (Cardiothoracic Vascular Surgery) | Admitting: Thoracic Surgery (Cardiothoracic Vascular Surgery)

## 2020-04-12 ENCOUNTER — Encounter: Payer: Self-pay | Admitting: Thoracic Surgery (Cardiothoracic Vascular Surgery)

## 2020-04-12 ENCOUNTER — Ambulatory Visit (HOSPITAL_BASED_OUTPATIENT_CLINIC_OR_DEPARTMENT_OTHER)
Admission: RE | Admit: 2020-04-12 | Discharge: 2020-04-12 | Disposition: A | Discharge status: Home / Self Care | Payer: Medicare HMO | Source: Ambulatory Visit | Attending: Thoracic Surgery (Cardiothoracic Vascular Surgery) | Admitting: Thoracic Surgery (Cardiothoracic Vascular Surgery)

## 2020-04-12 ENCOUNTER — Encounter (HOSPITAL_COMMUNITY)
Admission: RE | Admit: 2020-04-12 | Discharge: 2020-04-12 | Disposition: A | Discharge status: Home / Self Care | Payer: Medicare HMO | Source: Ambulatory Visit | Attending: Thoracic Surgery (Cardiothoracic Vascular Surgery) | Admitting: Thoracic Surgery (Cardiothoracic Vascular Surgery)

## 2020-04-12 ENCOUNTER — Ambulatory Visit: Payer: Medicare HMO | Admitting: Thoracic Surgery (Cardiothoracic Vascular Surgery)

## 2020-04-12 ENCOUNTER — Encounter (HOSPITAL_COMMUNITY): Payer: Self-pay

## 2020-04-12 ENCOUNTER — Other Ambulatory Visit: Payer: Self-pay

## 2020-04-12 ENCOUNTER — Other Ambulatory Visit (HOSPITAL_COMMUNITY)
Admission: RE | Admit: 2020-04-12 | Discharge: 2020-04-12 | Disposition: A | Discharge status: Home / Self Care | Payer: Medicare HMO | Source: Ambulatory Visit | Attending: Thoracic Surgery (Cardiothoracic Vascular Surgery) | Admitting: Thoracic Surgery (Cardiothoracic Vascular Surgery)

## 2020-04-12 VITALS — BP 104/69 | HR 87 | Temp 97.7°F | Resp 20 | Ht 68.0 in | Wt 154.0 lb

## 2020-04-12 DIAGNOSIS — I712 Thoracic aortic aneurysm, without rupture: Secondary | ICD-10-CM | POA: Insufficient documentation

## 2020-04-12 DIAGNOSIS — Z01818 Encounter for other preprocedural examination: Secondary | ICD-10-CM | POA: Insufficient documentation

## 2020-04-12 DIAGNOSIS — I350 Nonrheumatic aortic (valve) stenosis: Secondary | ICD-10-CM

## 2020-04-12 DIAGNOSIS — E785 Hyperlipidemia, unspecified: Secondary | ICD-10-CM | POA: Insufficient documentation

## 2020-04-12 DIAGNOSIS — I7121 Aneurysm of the ascending aorta, without rupture: Secondary | ICD-10-CM

## 2020-04-12 DIAGNOSIS — Z20822 Contact with and (suspected) exposure to covid-19: Secondary | ICD-10-CM | POA: Insufficient documentation

## 2020-04-12 DIAGNOSIS — I10 Essential (primary) hypertension: Secondary | ICD-10-CM | POA: Insufficient documentation

## 2020-04-12 LAB — BLOOD GAS, ARTERIAL
Acid-Base Excess: 0.5 mmol/L (ref 0.0–2.0)
Bicarbonate: 24.4 mmol/L (ref 20.0–28.0)
Drawn by: 58793
FIO2: 21
O2 Saturation: 96.3 %
Patient temperature: 37
pCO2 arterial: 38 mmHg (ref 32.0–48.0)
pH, Arterial: 7.424 (ref 7.350–7.450)
pO2, Arterial: 82.4 mmHg — ABNORMAL LOW (ref 83.0–108.0)

## 2020-04-12 LAB — PROTIME-INR
INR: 1.1 (ref 0.8–1.2)
Prothrombin Time: 13.5 seconds (ref 11.4–15.2)

## 2020-04-12 LAB — CBC
HCT: 43.4 % (ref 39.0–52.0)
Hemoglobin: 14.7 g/dL (ref 13.0–17.0)
MCH: 30.8 pg (ref 26.0–34.0)
MCHC: 33.9 g/dL (ref 30.0–36.0)
MCV: 91 fL (ref 80.0–100.0)
Platelets: 170 10*3/uL (ref 150–400)
RBC: 4.77 MIL/uL (ref 4.22–5.81)
RDW: 13.1 % (ref 11.5–15.5)
WBC: 4.6 10*3/uL (ref 4.0–10.5)
nRBC: 0 % (ref 0.0–0.2)

## 2020-04-12 LAB — URINALYSIS, ROUTINE W REFLEX MICROSCOPIC
Bilirubin Urine: NEGATIVE
Glucose, UA: NEGATIVE mg/dL
Hgb urine dipstick: NEGATIVE
Ketones, ur: NEGATIVE mg/dL
Leukocytes,Ua: NEGATIVE
Nitrite: NEGATIVE
Protein, ur: NEGATIVE mg/dL
Specific Gravity, Urine: 1.009 (ref 1.005–1.030)
pH: 7 (ref 5.0–8.0)

## 2020-04-12 LAB — COMPREHENSIVE METABOLIC PANEL
ALT: 20 U/L (ref 0–44)
AST: 15 U/L (ref 15–41)
Albumin: 4.1 g/dL (ref 3.5–5.0)
Alkaline Phosphatase: 36 U/L — ABNORMAL LOW (ref 38–126)
Anion gap: 8 (ref 5–15)
BUN: 7 mg/dL — ABNORMAL LOW (ref 8–23)
CO2: 23 mmol/L (ref 22–32)
Calcium: 8.8 mg/dL — ABNORMAL LOW (ref 8.9–10.3)
Chloride: 98 mmol/L (ref 98–111)
Creatinine, Ser: 0.97 mg/dL (ref 0.61–1.24)
GFR calc Af Amer: >60 mL/min (ref >60)
GFR calc non Af Amer: >60 mL/min (ref >60)
Glucose, Bld: 105 mg/dL — ABNORMAL HIGH (ref 70–99)
Potassium: 4.1 mmol/L (ref 3.5–5.1)
Sodium: 129 mmol/L — ABNORMAL LOW (ref 135–145)
Total Bilirubin: 0.6 mg/dL (ref 0.3–1.2)
Total Protein: 6.4 g/dL — ABNORMAL LOW (ref 6.5–8.1)

## 2020-04-12 LAB — TYPE AND SCREEN
ABO/RH(D): A POS
Antibody Screen: NEGATIVE

## 2020-04-12 LAB — SURGICAL PCR SCREEN
MRSA, PCR: NEGATIVE
Staphylococcus aureus: NEGATIVE

## 2020-04-12 LAB — SARS CORONAVIRUS 2 (TAT 6-24 HRS): SARS Coronavirus 2: NEGATIVE

## 2020-04-12 LAB — APTT: aPTT: 32 seconds (ref 24–36)

## 2020-04-12 NOTE — Progress Notes (Signed)
[facility] - [place], [place] - [address] [place] [address] Phone: [phone] Fax: [phone]      Your procedure is scheduled on August 25  Report to [facility] Main Entrance "A" at 0530 A.M., and check in at the Admitting office.  Call this number if you have problems the morning of surgery:  [phone]  Call [phone] if you have any questions prior to your surgery date Monday-Friday 8am-4pm    Remember:  Do not eat or drink after midnight the night before your surgery   Take these medicines the morning of surgery with A SIP OF WATER  albuterol (VENTOLIN HFA)  If needed, Please bring all inhalers with you the day of surgery.  fluticasone (FLONASE) if needed metoprolol succinate (TOPROL-XL) rosuvastatin (CRESTOR)  tamsulosin (FLOMAX)   As of today, STOP taking any Aspirin (unless otherwise instructed by your surgeon) Aleve, Naproxen, Ibuprofen, Motrin, Advil, Goody's, BC's, all herbal medications, fish oil, and all vitamins.                      Do not wear jewelry            Do not wear lotions, powders, colognes, or deodorant.            Men may shave face and neck.            Do not bring valuables to the hospital.            [facility] is not responsible for any belongings or valuables.  Do NOT Smoke (Tobacco/Vaping) or drink Alcohol 24 hours prior to your procedure If you use a CPAP at night, you may bring all equipment for your overnight stay.   Contacts, glasses, dentures or bridgework may not be worn into surgery.      For patients admitted to the hospital, discharge time will be determined by your treatment team.   Patients discharged the day of surgery will not be allowed to drive home, and someone needs to stay with them for 24 hours.    Special instructions:   [employer]- Preparing For Surgery  Before surgery, you can play an important role. Because skin is not sterile, your  skin needs to be as free of germs as possible. You can reduce the number of germs on your skin by washing with CHG (chlorahexidine gluconate) Soap before surgery.  CHG is an antiseptic cleaner which kills germs and bonds with the skin to continue killing germs even after washing.    Oral Hygiene is also important to reduce your risk of infection.  Remember - BRUSH YOUR TEETH THE MORNING OF SURGERY WITH YOUR REGULAR TOOTHPASTE  Please do not use if you have an allergy to CHG or antibacterial soaps. If your skin becomes reddened/irritated stop using the CHG.  Do not shave (including legs and underarms) for at least 48 hours prior to first CHG shower. It is OK to shave your face.  Please follow these instructions carefully.   1. Shower the NIGHT BEFORE SURGERY and the MORNING OF SURGERY with CHG Soap.   2. If you chose to wash your hair, wash your hair first as usual with your normal shampoo.  3. After you shampoo, rinse your hair and body thoroughly to remove the shampoo.  4. Use CHG as you would any other liquid soap. You can apply CHG directly to the skin and wash gently with a scrungie  or a clean washcloth.   5. Apply the CHG Soap to your body ONLY FROM THE NECK DOWN.  Do not use on open wounds or open sores. Avoid contact with your eyes, ears, mouth and genitals (private parts). Wash Face and genitals (private parts)  with your normal soap.   6. Wash thoroughly, paying special attention to the area where your surgery will be performed.  7. Thoroughly rinse your body with warm water from the neck down.  8. DO NOT shower/wash with your normal soap after using and rinsing off the CHG Soap.  9. Pat yourself dry with a CLEAN TOWEL.  10. Wear CLEAN PAJAMAS to bed the night before surgery  11. Place CLEAN SHEETS on your bed the night of your first shower and DO NOT SLEEP WITH PETS.   Day of Surgery: Wear Clean/Comfortable clothing the morning of surgery Do not apply any  deodorants/lotions.   Remember to brush your teeth WITH YOUR REGULAR TOOTHPASTE.   Please read over the following fact sheets that you were given.

## 2020-04-12 NOTE — Progress Notes (Signed)
Notified Levonne Spiller RN of patients Sodium level of 129

## 2020-04-12 NOTE — Patient Instructions (Signed)
Do not take aspirin  Continue taking all other medications without change through the day before surgery.  Make sure to bring all of your medications with you when you come for your Pre-Admission Testing appointment at [facility] Short-Stay Department.  Have nothing to eat or drink after midnight the night before surgery.  On the morning of surgery do not take any medications.

## 2020-04-12 NOTE — Progress Notes (Signed)
PCP - Nile Riggs FNP Cardiologist - [place]  Chest x-ray - 04/12/20 EKG - 04/12/20 Stress Test - denies ECHO - 5/21 Cardiac Cath - 02/17/20   Aspirin Instructions: last dose 03/29/20   COVID TEST- 04/12/20 pending result   Anesthesia review: yes, cardiac hx  Patient denies shortness of breath, fever, cough and chest pain at PAT appointment   All instructions explained to the patient, with a verbal understanding of the material. Patient agrees to go over the instructions while at home for a better understanding. Patient also instructed to self quarantine after being tested for COVID-19. The opportunity to ask questions was provided.

## 2020-04-12 NOTE — Progress Notes (Signed)
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  He was originally seen in consultation on March 22, 2020 at which time we made tentative plans to proceed with definitive surgical intervention later this week.  He was instructed to stop smoking if at all possible at that time.  The patient states that he has tried very hard to quit smoking and has cut back to smoking 1 cigarette daily.  He reports no new problems or complaints.  His breathing has not changed.  He still gets short of breath with exertion but it has not gotten any worse.  He reports occasional nonproductive cough.  He has not had productive cough, hemoptysis, nor wheezing.  He has not had fevers or chills.  He has not been exposed to any persons with known or suspected COVID-19 infection in the patient was previously vaccinated against the COVID-19 virus, having received both doses of the Pfizer vaccine.    Current Outpatient Medications  Medication Sig Dispense Refill  . albuterol (VENTOLIN HFA) 108 (90 Base) MCG/ACT inhaler Inhale 1 puff into the lungs every 4 (four) hours as needed for wheezing or shortness of breath.     . Cholecalciferol (VITAMIN D3) 1.25 MG (50000 UT) CAPS Take 50,000 Units by mouth once a week.     . fluticasone (FLONASE) 50 MCG/ACT nasal spray Place 1 spray into both nostrils daily as needed for rhinitis.     . metoprolol succinate (TOPROL-XL) 50 MG 24 hr tablet Take 0.5 tablets (25 mg total) by mouth daily.    . nicotine (NICODERM CQ - DOSED IN MG/24 HOURS) 21 mg/24hr patch Place 21 mg onto the skin daily.     . rosuvastatin  (CRESTOR) 10 MG tablet Take 0.5 tablets (5 mg total) by mouth daily. 30 tablet 2  . tamsulosin (FLOMAX) 0.4 MG CAPS capsule Take 0.4 mg by mouth daily.      No current facility-administered medications for this visit.      Physical Exam:   BP 104/69 (BP Location: Right Arm, Patient Position: Sitting, Cuff Size: Normal)   Pulse 87   Temp 97.7 F (36.5 C) (Temporal)   Resp 20   Ht 5\' 8"  (1.727 m)   Wt 154 lb (69.9 kg)   SpO2 96% Comment: RA  BMI 23.42 kg/m   General:  Well-appearing  Chest:   Clear to auscultation  CV:   Regular rate and rhythm with prominent systolic murmur  Incisions:  n/a  Abdomen:  Soft nontender  Extremities:  Warm and well-perfused  Diagnostic Tests:  Cardiac TAVR CT  TECHNIQUE: The patient was scanned on a Siemens Force 546 slice scanner. A 120 kV retrospective scan was triggered in the descending thoracic aorta at 111 HU's. Gantry rotation speed was 270 msecs and collimation was .9 mm. No beta blockade or nitro were given. The 3D data set was reconstructed in 5% intervals of the R-R cycle. Systolic and diastolic phases were analyzed on a dedicated work station using MPR, MIP and VRT modes. The patient received 155mL  OMNIPAQUE IOHEXOL 350 MG/ML SOLN of contrast.  FINDINGS: Aortic Valve: Bicuspid aortic valve with fusion of right and non coronary cusps and calcified raphe. Severely reduced cusp separation. Moderately thickened, severely calcified aortic valve cusps.  AV calcium score: 5938  Virtual Basal Annulus Measurements:  Maximum/Minimum Diameter: 30.4 x 27.4 mm  Perimeter: 88.4 mm  Area: 602 mm2  No significant LVOT calcifications.  Based on these measurements, the annulus would be suitable for a 29 mm Sapien 3 valve.  Sinus of Valsalva Measurements:  Sinus measures 42 mm x 34 mm measured from commissures. See detailed aorta measurements below.  Sinus of Valsalva Height:  Left: 28.1 mm  Right: 20.3  mm  Aorta: Conventional 3 vessel branch pattern of aortic arch. No coarctation of the aorta.  Measurements made in double oblique technique:  Sinus of Valsalva:  N-L: 44 mm  R-L: 44 mm  Fused cusp N-R: 37 mm  ST Junction: 42 x 40 mm  Mid ascending aorta (at PA bifurcation): 52 mm  Distal ascending aorta (just proximal to right brachiocephalic artery): 47 mm  Arch (between left common carotid and left subclavian arteries): 31 mm  Proximal descending aorta: 28 mm  Mid descending aorta (at level of pulmonary veins): 28 mm  Distal descending aorta (just proximal to aortic hiatus): 27 mm  Coronary Artery Height above Annulus:  Left Main: 20 mm  Right Coronary: 16.3 mm  Coronary Arteries: Normal origins.  LAD and RCA calcifications.  Optimum Fluoroscopic Angle for Delivery: LAO 10, CAU 8  IMPRESSION: 1. Bicuspid aortic valve, Sievers Type I with fusion of right and non coronary cusps and calcified raphe. Severely reduced cusp separation. Moderately thickened, severely calcified aortic valve cusps.  2. AV calcium score: 5938  3. Annulus area: 602 mm2  4. Moderately dilated Sinus of Valsalva, 44 mm from fused cusp to left coronary cusp.  5. Severe dilation of mid ascending aorta, 52 mm.   Electronically Signed   By: Cherlynn Kaiser   On: 04/12/2020 10:13    CT ANGIOGRAPHY CHEST, ABDOMEN AND PELVIS  TECHNIQUE: Multidetector CT imaging through the chest, abdomen and pelvis was performed using the standard protocol during bolus administration of intravenous contrast. Multiplanar reconstructed images and MIPs were obtained and reviewed to evaluate the vascular anatomy.  CONTRAST:  142mL OMNIPAQUE IOHEXOL 350 MG/ML SOLN  COMPARISON:  Chest CT 12/25/2019.  FINDINGS: CTA CHEST FINDINGS  Cardiovascular: Heart size is normal. There is no significant pericardial fluid, thickening or pericardial calcification. There is aortic  atherosclerosis, as well as atherosclerosis of the great vessels of the mediastinum and the coronary arteries, including calcified atherosclerotic plaque in the left main, left anterior descending and right coronary arteries. Severe thickening and calcification of the aortic valve. Aneurysmal dilatation of the ascending thoracic aorta which measures up to 5.1 cm in diameter.  Mediastinum/Lymph Nodes: No pathologically enlarged mediastinal or hilar lymph nodes. Esophagus is unremarkable in appearance. No axillary lymphadenopathy.  Lungs/Pleura: No suspicious appearing pulmonary nodules or masses are noted. No acute consolidative airspace disease. No pleural effusions. Diffuse bronchial wall thickening with moderate centrilobular and paraseptal emphysema.  Musculoskeletal/Soft Tissues: There are no aggressive appearing lytic or blastic lesions noted in the visualized portions of the skeleton.  CTA ABDOMEN AND PELVIS FINDINGS  Hepatobiliary: No suspicious cystic or solid hepatic lesions. No intra or extrahepatic biliary ductal dilatation. Gallbladder is normal in appearance.  Pancreas: No pancreatic mass. No pancreatic ductal dilatation. No pancreatic or peripancreatic fluid collections or inflammatory changes.  Spleen: Unremarkable.  Adrenals/Urinary Tract: Bilateral adrenal nodules measuring 1.9 x 1.3 cm on the right and 3.1 x 1.5 cm on the left, previously characterized as adenomas. Bilateral kidneys are normal in appearance. No hydroureteronephrosis. Urinary bladder is normal in appearance.  Stomach/Bowel: Normal appearance of the stomach. No pathologic dilatation of small bowel or colon. Normal appendix.  Vascular/Lymphatic: Aortic atherosclerosis, with vascular findings and measurements pertinent to potential TAVR procedure, as detailed below. 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 There are no  aggressive appearing lytic or blastic lesions noted in the visualized portions of the skeleton.  Reproductive: Prostate gland and seminal vesicles are unremarkable in appearance.  Other: No significant volume of ascites.  No pneumoperitoneum.  Musculoskeletal: There are no aggressive appearing lytic or blastic lesions noted in the visualized portions of the skeleton.  VASCULAR MEASUREMENTS PERTINENT TO TAVR:  AORTA:  Minimal Aortic Diameter-11 x 11 mm  Severity of Aortic Calcification-moderate to severe  RIGHT PELVIS:  Right Common Iliac Artery -  Minimal Diameter-6.2 x 7.8 mm  Tortuosity-mild  Calcification-moderate  Right External Iliac Artery -  Minimal Diameter-6.2 x 4.6 mm  Tortuosity-moderate  Calcification-mild  Right Common Femoral Artery -  Minimal Diameter-6.1 x 5.6 mm  Tortuosity-mild  Calcification-moderate  LEFT PELVIS:  Left Common Iliac Artery -  Minimal Diameter-8.1 x 6.1 mm  Tortuosity-mild  Calcification-moderate  Left External Iliac Artery -  Minimal Diameter-6.4 x 4.9 mm  Tortuosity-moderate  Calcification-mild  Left Common Femoral Artery -  Minimal Diameter-6.8 x 6.5 mm  Tortuosity-mild  Calcification-mild-to-moderate  Review of the MIP images confirms the above findings.  IMPRESSION: 1. Vascular findings and measurements pertinent to potential TAVR procedure, as detailed above. 2. Severe thickening and calcification of the aortic valve, compatible with the reported clinical history of severe aortic stenosis. 3. Aortic atherosclerosis, in addition to left main and 2 vessel coronary artery disease. Assessment for potential risk factor modification, dietary therapy or pharmacologic therapy may be warranted, if clinically indicated. 4. Aneurysmal dilatation of the ascending thoracic aorta (5.1 cm in diameter). Ascending thoracic aortic aneurysm. Recommend semi-annual imaging  followup by CTA or MRA and referral to cardiothoracic surgery if not already obtained. This recommendation follows 2010 ACCF/AHA/AATS/ACR/ASA/SCA/SCAI/SIR/STS/SVM Guidelines for the Diagnosis and Management of Patients With Thoracic Aortic Disease. Circulation. 2010; 121: Z610-R604. Aortic aneurysm NOS (ICD10-I71.9). 5. Diffuse bronchial wall thickening with moderate centrilobular and paraseptal emphysema; imaging findings suggestive of underlying COPD. 6. Bilateral adrenal adenomas redemonstrated. 7. Additional incidental findings, as above.   Electronically Signed   By: Vinnie Langton M.D.   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  He describes stable symptoms of exertional shortness of breath consistent with chronic diastolic congestive heart failure, New York Heart Association functional class II.  Symptoms may be exacerbated by the presence of significant underlying COPD.  I personally reviewed both report and images from transthoracic echocardiogram performed recently at Dr. Irven Shelling office.  Echocardiogram reveals what appears to be a Sievers type I bicuspid aortic valve with severe aortic stenosis.  There is severely restricted leaflet mobility with moderate to severe thickening and calcification.  Peak velocity across aortic valve measured greater than 4.1 m/s corresponding to mean transvalvular gradient estimated greater than 40 mmHg.  Left ventricular systolic function remains normal.  The  aorta appears moderately dilated and recent noncontrast CT scan of the chest was notable for maximum transverse diameter of the ascending aorta measuring approximately 5.1 cm.  Diagnostic cardiac catheterization is notable for the absence of significant coronary artery disease and revealed normal right heart pressures and  hemodynamics.  Cardiac gated CT angiogram of the heart confirmed the presence of Sievers type I bicuspid aortic valve with fusion of the right and noncoronary cusps and a large single calcified raphae.  There was severely reduced cusp separation and AV calcium score of 5938.  The aortic root was not aneurysmally enlarged but the mid ascending thoracic aorta was dilated to greater than 5 cm diameter.  Patient would likely best be treated with concomitant aortic valve replacement with repair of ascending thoracic aorta.  Risks associated with conventional surgery would be relatively low although the patient may be at somewhat increased risk for problems with respiratory failure due to his significant underlying chronic lung disease.   Plan:  The patient and his 2 daughters were again counseled at length regarding treatment alternatives for management of severe symptomatic aortic stenosis with aneurysmal enlargement of the ascending aorta. Alternative approaches such as conventional aortic valve replacement with repair of the aortic aneurysm, transcatheter aortic valve replacement, and continued medical therapy without intervention were compared and contrasted at length.  The risks associated with conventional surgery were discussed in detail, as were expectations for post-operative convalescence, alternative surgical approaches, prosthetic valve choices, and the presence of other concomitant conditions which might require intervention.  Issues specific to transcatheter aortic valve replacement were discussed including questions about long term valve durability, the potential for paravalvular leak, possible increased risk of need for permanent pacemaker placement, the suitability of the patient's surgical anatomy, and other potential technical complications related to the procedure itself.  Patient desires to proceed with aortic valve replacement and repair of ascending thoracic aorta later this week as  previously scheduled.  Prior to surgery the patient will undergo formal pulmonary function testing.  This has been scheduled for tomorrow.  Discussion was held comparing the relative risks of mechanical valve replacement with need for lifelong anticoagulation versus use of a bioprosthetic tissue valve and the associated potential for late structural valve deterioration and failure.  This discussion was placed in the context of the patient's particular circumstances, and as a result the patient specifically requests that their valve be replaced using a bioprosthetic tissue valve.    The patient understands and accepts all potential associated risks of surgery including but not limited to risk of death, stroke, myocardial infarction, congestive heart failure, respiratory failure, renal failure, pneumonia, bleeding requiring blood transfusion and or reexploration, arrhythmia, heart block or bradycardia requiring permanent pacemaker, aortic dissection or other major vascular complication, pleural effusions or other delayed complications related to continued congestive heart failure, and other late complications related to valve replacement including structural valve deterioration and failure, thrombosis, endocarditis, or paravalvular leak.    I spent in excess of 15 minutes during the conduct of this office consultation and >50% of this time involved direct face-to-face encounter with the patient for counseling and/or coordination of their care.    Valentina Gu. Roxy Manns, MD 04/12/2020 3:57 PM

## 2020-04-13 ENCOUNTER — Ambulatory Visit (HOSPITAL_COMMUNITY)
Admission: RE | Admit: 2020-04-13 | Discharge: 2020-04-13 | Disposition: A | Discharge status: Home / Self Care | Payer: Medicare HMO | Source: Ambulatory Visit | Attending: Thoracic Surgery (Cardiothoracic Vascular Surgery) | Admitting: Thoracic Surgery (Cardiothoracic Vascular Surgery)

## 2020-04-13 DIAGNOSIS — I712 Thoracic aortic aneurysm, without rupture: Secondary | ICD-10-CM | POA: Insufficient documentation

## 2020-04-13 DIAGNOSIS — I7121 Aneurysm of the ascending aorta, without rupture: Secondary | ICD-10-CM

## 2020-04-13 LAB — HEMOGLOBIN A1C
Hgb A1c MFr Bld: 5.3 % (ref 4.8–5.6)
Mean Plasma Glucose: 105 mg/dL

## 2020-04-13 LAB — PULMONARY FUNCTION TEST
DL/VA % pred: 64 %
DL/VA: 2.58 ml/min/mmHg/L
DLCO cor % pred: 62 %
DLCO cor: 14.97 ml/min/mmHg
DLCO unc % pred: 62 %
DLCO unc: 15.01 ml/min/mmHg
FEF 25-75 Post: 0.64 L/sec
FEF 25-75 Pre: 0.8 L/sec
FEF2575-%Change-Post: -19 %
FEF2575-%Pred-Post: 30 %
FEF2575-%Pred-Pre: 37 %
FEV1-%Change-Post: -1 %
FEV1-%Pred-Post: 73 %
FEV1-%Pred-Pre: 75 %
FEV1-Post: 2.14 L
FEV1-Pre: 2.17 L
FEV1FVC-%Change-Post: -1 %
FEV1FVC-%Pred-Pre: 74 %
FEV6-%Change-Post: -3 %
FEV6-%Pred-Post: 92 %
FEV6-%Pred-Pre: 95 %
FEV6-Post: 3.44 L
FEV6-Pre: 3.56 L
FEV6FVC-%Change-Post: -4 %
FEV6FVC-%Pred-Post: 91 %
FEV6FVC-%Pred-Pre: 95 %
FVC-%Change-Post: 0 %
FVC-%Pred-Post: 100 %
FVC-%Pred-Pre: 100 %
FVC-Post: 4 L
FVC-Pre: 3.98 L
Post FEV1/FVC ratio: 53 %
Post FEV6/FVC ratio: 86 %
Pre FEV1/FVC ratio: 54 %
Pre FEV6/FVC Ratio: 90 %
RV % pred: 144 %
RV: 3.47 L
TLC % pred: 118 %
TLC: 7.85 L

## 2020-04-13 MED ORDER — TRANEXAMIC ACID 1000 MG/10ML IV SOLN
1.5000 mg/kg/h | INTRAVENOUS | Status: AC
  Administered 2020-04-14: 1.5 mg/kg/h via INTRAVENOUS
  Filled 2020-04-13: qty 25

## 2020-04-13 MED ORDER — TRANEXAMIC ACID (OHS) PUMP PRIME SOLUTION
2.0000 mg/kg | INTRAVENOUS | Status: DC
  Filled 2020-04-13: qty 1.4

## 2020-04-13 MED ORDER — NITROGLYCERIN IN D5W 200-5 MCG/ML-% IV SOLN
2.0000 ug/min | INTRAVENOUS | Status: AC
  Administered 2020-04-14: 16.6 ug/min via INTRAVENOUS
  Filled 2020-04-13: qty 250

## 2020-04-13 MED ORDER — DEXMEDETOMIDINE HCL IN NACL 400 MCG/100ML IV SOLN
0.1000 ug/kg/h | INTRAVENOUS | Status: AC
  Administered 2020-04-14: .2 ug/kg/h via INTRAVENOUS
  Filled 2020-04-13: qty 100

## 2020-04-13 MED ORDER — ALBUTEROL SULFATE (2.5 MG/3ML) 0.083% IN NEBU
2.5000 mg | INHALATION_SOLUTION | Freq: Once | RESPIRATORY_TRACT | Status: AC
  Administered 2020-04-13: 2.5 mg via RESPIRATORY_TRACT

## 2020-04-13 MED ORDER — MANNITOL 20 % IV SOLN
INTRAVENOUS | Status: DC
  Filled 2020-04-13: qty 13

## 2020-04-13 MED ORDER — POTASSIUM CHLORIDE 2 MEQ/ML IV SOLN
80.0000 meq | INTRAVENOUS | Status: DC
  Filled 2020-04-13: qty 40

## 2020-04-13 MED ORDER — MILRINONE LACTATE IN DEXTROSE 20-5 MG/100ML-% IV SOLN
0.3000 ug/kg/min | INTRAVENOUS | Status: DC
  Filled 2020-04-13: qty 100

## 2020-04-13 MED ORDER — VANCOMYCIN HCL 1250 MG/250ML IV SOLN
1250.0000 mg | INTRAVENOUS | Status: AC
  Administered 2020-04-14: 1250 mg via INTRAVENOUS
  Filled 2020-04-13: qty 250

## 2020-04-13 MED ORDER — INSULIN REGULAR(HUMAN) IN NACL 100-0.9 UT/100ML-% IV SOLN
INTRAVENOUS | Status: AC
  Administered 2020-04-14: 1 [IU]/h via INTRAVENOUS
  Filled 2020-04-13: qty 100

## 2020-04-13 MED ORDER — NOREPINEPHRINE 4 MG/250ML-% IV SOLN
0.0000 ug/min | INTRAVENOUS | Status: AC
  Administered 2020-04-14: 4 ug/min via INTRAVENOUS
  Filled 2020-04-13: qty 250

## 2020-04-13 MED ORDER — SODIUM CHLORIDE 0.9 % IV SOLN
INTRAVENOUS | Status: DC
  Filled 2020-04-13: qty 30

## 2020-04-13 MED ORDER — EPINEPHRINE HCL 5 MG/250ML IV SOLN IN NS
0.0000 ug/min | INTRAVENOUS | Status: DC
  Filled 2020-04-13: qty 250

## 2020-04-13 MED ORDER — GLUTARALDEHYDE 0.625% SOAKING SOLUTION
TOPICAL | Status: DC
  Filled 2020-04-13: qty 50

## 2020-04-13 MED ORDER — SODIUM CHLORIDE 0.9 % IV SOLN
1.5000 g | INTRAVENOUS | Status: AC
  Administered 2020-04-14: 1.5 g via INTRAVENOUS
  Filled 2020-04-13: qty 1.5

## 2020-04-13 MED ORDER — VANCOMYCIN HCL 1000 MG IV SOLR
INTRAVENOUS | Status: DC
  Filled 2020-04-13: qty 1000

## 2020-04-13 MED ORDER — SODIUM CHLORIDE 0.9 % IV SOLN
750.0000 mg | INTRAVENOUS | Status: AC
  Administered 2020-04-14: 750 mg via INTRAVENOUS
  Filled 2020-04-13: qty 750

## 2020-04-13 MED ORDER — TRANEXAMIC ACID (OHS) BOLUS VIA INFUSION
15.0000 mg/kg | INTRAVENOUS | Status: AC
  Administered 2020-04-14: 1048.5 mg via INTRAVENOUS
  Filled 2020-04-13: qty 1049

## 2020-04-13 MED ORDER — PHENYLEPHRINE HCL-NACL 20-0.9 MG/250ML-% IV SOLN
30.0000 ug/min | INTRAVENOUS | Status: AC
  Administered 2020-04-14: 60 ug/min via INTRAVENOUS
  Filled 2020-04-13: qty 250

## 2020-04-13 MED ORDER — PLASMA-LYTE 148 IV SOLN
INTRAVENOUS | Status: DC
  Filled 2020-04-13: qty 2.5

## 2020-04-13 NOTE — H&P (Signed)
[address]       [place] [address]             [phone]          CARDIOTHORACIC SURGERY HISTORY AND PHYSICAL EXAM  Referring Provider is Adrian Prows, MD PCP is Timoteo Gaul, FNP  Chief Complaint  Patient presents with  . Thoracic Aortic Aneurysm    new patient consultation, CATH 02/17/20, ECHO 01/13/20  . Aortic Stenosis   HPI:  Patient is 74 year old male with longstanding history of heart murmur and tobacco abuse who has been referred for surgical consultation to discuss treatment options for management of recently discovered severe symptomatic aortic stenosis with aneurysm involving the ascending thoracic aorta.  Patient states that he was first told that he had a heart murmur as a teenager but he has never had a formal cardiac evaluation until recently.  He has a longstanding history of tobacco abuse and underwent low-dose screening CT scan without contrast to rule out the possibility of lung cancer.  CT scan revealed findings consistent with centrilobular and paraseptal emphysema as well as an 8.2 mm opacity at the apex of the right lung that was possibly consistent with scarring but worthy of follow-up.  CT scan also revealed dilated ascending thoracic aorta which measured 5.1 cm in its greatest diameter.  The patient was referred for cardiac evaluation and seen by Dr. Einar Gip who noted a prominent systolic murmur on physical exam.  Transthoracic echocardiogram performed in his office revealed normal left ventricular systolic function with severe aortic stenosis.  Peak velocity across aortic valve was measured 4.1 m/s corresponding to mean transvalvular gradient estimated 44 mmHg.  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  Diagnostic cardiac catheterization performed February 17, 2020 revealed normal coronary artery anatomy with no significant coronary artery disease.  Right heart  pressures and hemodynamics were normal.  Cardiothoracic surgical consultation was requested.  Patient is separated and lives with his friend in [place].  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  He has remained reasonably active physically and retirement and is entirely functionally independent.  He admits to a several year history of symptoms of exertional shortness of breath and fatigue.  The patient only gets short of breath with moderate level activity and always recovers quickly when he stops to rest.  He has had occasional slight dizzy spells and approximately 2 years ago had a brief syncopal episode.  He denies any chest pain either with activity or at rest.  He denies any history of resting shortness of breath, PND, orthopnea, or lower extremity edema.  He has a chronic dry cough that is nonproductive.  He was treated for bronchitis and possible pneumonia last February.  He has a longstanding history of tobacco abuse dating back more than 60 years.  He currently smokes less than 1/2 pack cigarettes daily but in the past he has smoked as much as 2 packs a day.  Patient was originally seen in consultation on March 22, 2020 at which time we made tentative plans to proceed with definitive surgical intervention later this week.  He was instructed to stop smoking if at all possible at that time.  The patient states that he has tried very hard to quit smoking and has cut back to smoking 1 cigarette daily.  He reports no  new problems or complaints.  His breathing has not changed.  He still gets short of breath with exertion but it has not gotten any worse.  He reports occasional nonproductive cough.  He has not had productive cough, hemoptysis, nor wheezing.  He has not had fevers or chills.  He has not been exposed to any persons with known or suspected COVID-19 infection in the patient was previously vaccinated against  the COVID-19 virus, having received both doses of the Pfizer vaccine.  Past Medical History:  Diagnosis Date  . Centrilobular emphysema ([place])   . Hyperlipidemia   . Hypertension   . Severe aortic stenosis   . Thoracic ascending aortic aneurysm ([place])   . Tobacco abuse     Past Surgical History:  Procedure Laterality Date  . EYE SURGERY Bilateral    cataracts  . RIGHT HEART CATH AND CORONARY ANGIOGRAPHY N/A 02/17/2020   Procedure: RIGHT HEART CATH AND CORONARY ANGIOGRAPHY;  Surgeon: Adrian Prows, MD;  Location: [facility];  Service: Cardiovascular;  Laterality: N/A;    No family history on file.  Social History Social History   Tobacco Use  . Smoking status: Current Some Day Smoker    Packs/day: 0.25    Years: 50.00    Pack years: 12.50    Types: Cigarettes  . Smokeless tobacco: Never Used  . Tobacco comment: trying to quit   Vaping Use  . Vaping Use: Former  . Start date: 08/21/2016  . Quit date: 09/21/2016  Substance Use Topics  . Alcohol use: Yes    Alcohol/week: 10.0 standard drinks    Types: 10 Cans of beer per week    Comment: per week  . Drug use: Not Currently    Prior to Admission medications   Medication Sig Start Date End Date Taking? Authorizing Provider  albuterol (VENTOLIN HFA) 108 (90 Base) MCG/ACT inhaler Inhale 1 puff into the lungs every 4 (four) hours as needed for wheezing or shortness of breath.  11/25/19  Yes [provider]  Cholecalciferol (VITAMIN D3) 1.25 MG (50000 UT) CAPS Take 50,000 Units by mouth once a week.    Yes [provider]  fluticasone (FLONASE) 50 MCG/ACT nasal spray Place 1 spray into both nostrils daily as needed for rhinitis.  10/29/19  Yes [provider]  metoprolol succinate (TOPROL-XL) 50 MG 24 hr tablet Take 0.5 tablets (25 mg total) by mouth daily. 03/05/20  Yes Adrian Prows, MD  nicotine (NICODERM CQ - DOSED IN MG/24 HOURS) 21 mg/24hr patch Place 21 mg onto the skin daily.  11/20/19  Yes [provider]  rosuvastatin (CRESTOR) 10 MG tablet Take 0.5 tablets (5 mg total) by mouth daily. 01/07/20  Yes Adrian Prows, MD  tamsulosin (FLOMAX) 0.4 MG CAPS capsule Take 0.4 mg by mouth daily.  10/29/19  Yes [provider]    No Known Allergies     Review of Systems:              General:                      normal appetite, normal energy, no weight gain, no weight loss, no fever             Cardiac:                       no chest pain with exertion, no chest pain at rest, +SOB with exertion, no resting SOB, no  PND, no orthopnea, no palpitations, no arrhythmia, no atrial fibrillation, no LE edema, occasional dizzy spells, one episode syncope approx 2 years ago             Respiratory:                 + shortness of breath, no home oxygen, no productive cough, + chronic dry cough, + bronchitis, no wheezing, no hemoptysis, no asthma, no pain with inspiration or cough, no sleep apnea, no CPAP at night             GI:                               no difficulty swallowing, no reflux, no frequent heartburn, no hiatal hernia, no abdominal pain, no constipation, no diarrhea, no hematochezia, no hematemesis, no melena             GU:                              no dysuria,  no frequency, no urinary tract infection, no hematuria, no enlarged prostate, no kidney stones, no kidney disease             Vascular:                     no pain suggestive of claudication, no pain in feet, no leg cramps, no varicose veins, no DVT, no non-healing foot ulcer             Neuro:                         no stroke, no TIA's, no seizures, no headaches, no temporary blindness one eye,  no slurred speech, no peripheral neuropathy, no chronic pain, no instability of gait, no memory/cognitive dysfunction             Musculoskeletal:         mild arthritis, no joint swelling, no myalgias, no difficulty walking, normal mobility              Skin:                            no rash, no itching, no skin  infections, no pressure sores or ulcerations             Psych:                         no anxiety, no depression, no nervousness, no unusual recent stress             Eyes:                           no blurry vision, no floaters, no recent vision changes, + wears glasses or contacts             ENT:                            no hearing loss, no loose or painful teeth, edentulous with full dentures             Hematologic:               +  easy bruising, no abnormal bleeding, no clotting disorder, no frequent epistaxis             Endocrine:                   no diabetes, does not check CBG's at home                                                       Physical Exam:              BP (!) 145/95   Pulse 70   Temp 97.9 F (36.6 C) (Oral)   Resp 20   Ht 5\' 8"  (1.727 m)   Wt 156 lb (70.8 kg)   SpO2 93% Comment: RA  BMI 23.72 kg/m              General:                      Thin,  well-appearing             HEENT:                       Unremarkable              Neck:                           no JVD, no bruits, no adenopathy              Chest:                          clear to auscultation, distant but symmetrical breath sounds, no wheezes, no rhonchi              CV:                              RRR, grade III/VI crescendo/decrescendo murmur heard best at RSB,  no diastolic murmur             Abdomen:                    soft, non-tender, no masses              Extremities:                 warm, well-perfused, pulses palpable, no LE edema             Rectal/GU                   Deferred             Neuro:                         Grossly non-focal and symmetrical throughout             Skin:                            Clean and dry, no rashes, no breakdown   Diagnostic Tests:  CT CHEST WITHOUT CONTRAST LOW-DOSE FOR LUNG CANCER SCREENING  TECHNIQUE: Multidetector  CT imaging of the chest was performed following the standard protocol without IV contrast.  COMPARISON:  None.  FINDINGS: Cardiovascular: The heart size is normal. No substantial pericardial effusion. Coronary artery calcification is evident. Prominent calcification noted of the aortic valve. Ascending thoracic aorta measures 5.1 cm diameter.  Mediastinum/Nodes: No mediastinal lymphadenopathy. No evidence for gross hilar lymphadenopathy although assessment is limited by the lack of intravenous contrast on today's study. Tiny hiatal hernia. The esophagus has normal imaging features. There is no axillary lymphadenopathy.  Lungs/Pleura: Centrilobular and paraseptal emphysema evident. Scattered tiny bilateral pulmonary nodules identified. 8.2 mm irregular right anterior apical opacity may be related to scarring. No focal airspace consolidation. No pleural effusion.  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  Musculoskeletal: No worrisome lytic or sclerotic osseous abnormality.  IMPRESSION: 1. 8.2 mm irregular opacity anterior right lung apex, potentially scarring. Lung-RADS 4A, suspicious. Follow up low-dose chest CT without contrast in 3 months (please use the following order, "CT CHEST LCS NODULE FOLLOW-UP W/O CM") is recommended. Alternatively, PET may be considered when there is a solid component 24mm or larger. 2. 5.1 cm diameter ascending thoracic aorta with dense calcification of the aortic valve. Aortic stenosis would be a consideration. Ascending thoracic aortic aneurysm. Recommend semi-annual imaging followup by CTA or MRA and referral to cardiothoracic surgery if not already obtained. This recommendation follows 2010 ACCF/AHA/AATS/ACR/ASA/SCA/SCAI/SIR/STS/SVM Guidelines for the Diagnosis and Management of Patients With Thoracic Aortic Disease. Circulation. 2010; 121: Z610-R604. Aortic aneurysm NOS (ICD10-I71.9) 3. Emphysema (ICD10-J43.9) and Aortic Atherosclerosis (ICD10-170.0)   Electronically Signed By: Misty Stanley M.D. On:  12/26/2019 12:22   Echocardiogram 01/13/2020:  Left ventricle cavity is normal in size. Moderate concentric hypertrophy  of the left ventricle. Normal global wall motion. Normal LV systolic  function with visual EF 50-55%. Indeterminate diastolic filling pattern.  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 Severe  annular and leaflet calcification.  Severe aortic stenosis. Aortic valve mean gradient of 44 mmHg, Vmax of 4.1  m/s. Calculated aortic valve area by continuity equation is 0.5 cm.  Mild (Grade I) aortic regurgitation.  Mild (Grade I) mitral regurgitation.  Inadequate TR jet to estimate pulmonary artery systolic pressure. Normal  right atrial pressure.   RIGHT HEART CATH AND CORONARY ANGIOGRAPHY  Conclusion  Left and right heart catheterization 02/17/2020: Normal coronary arteries with right dominant circulation. Normal right heart cardiac catheterization, preserved cardiac output and cardiac index at 4.22 and 2.29 respectively.  Recommendation: Patient will need aortic valve replacement for severe aortic stenosis, also has ascending aortic aneurysm needs to be addressed. Will make referral for surgical team to evaluate. Indications  Nonrheumatic aortic valve stenosis [I35.0 (ICD-10-CM)]  Procedural Details  Technical Details Procedures performed: Right and left heart catheterization and occlusion of cardiac output and cardiac index by Fick. Right radial arterial access and right antecubital vein access was utilized for performing the procedure.   Indication: Adriane Guglielmo is a 74 y.o. male presenting with shortness of breath, has severe aortic stenosis by physical exam and by echocardiogram, also has ascending aortic aneurysm measuring 5 cm. Hence is brought to the cardiac catheterization lab to evaluate coronary anatomy. Right heart catheterization being performed for evaluation of dyspnea and pulmonary hypertension and to evaluate  cardiac output and cardiac index.  A 6 French sheath introduced into the right antecubital vein access. A 5 French Swan-Ganz catheter was advanced with balloon inflated on the sheath under fluoroscopic guidance into first the right atrium  followed by the right ventricle and into the pulmonary artery to pulmonary artery wedge position. Hemodynamics were obtained in a locations.  After hemodynamics were completed, samples were taken for SaO2% measurement to be used in [facility] /Index catheterization.  The catheter was then pulled back the balloon down and then completely out of the body. Hemostasis was achieved by applying manual pressure.  Left Heart Catheterization   A 6 F sheath was introduced into the right radial artery. Using safety J-wire, a 62F Tiger 4.0 catheter was advanced over standard J-wire into the ascending aorta and used to engage first the Left and Right Coronary Artery. Multiple cineangiographic views of the Left then Right Coronary Artery system(s) were performed.  The catheter was then removed the body over wire. All exchanges were made over standard J wire. Hemostasis was achieved by applying TR band and manual pressure at the venous access site. Contrast used 35 mL Estimated blood loss <50 mL.   During this procedure medications were administered to achieve and maintain moderate conscious sedation while the patient's heart rate, blood pressure, and oxygen saturation were continuously monitored and I was present face-to-face 100% of this time.  Medications (Filter: Administrations occurring from 1057 to 1201 on 02/17/20) (important) Continuous medications are totaled by the amount administered until 02/17/20 1201.  Heparin (Porcine) in NaCl 1000-0.9 UT/500ML-% SOLN (mL) Total volume:  500 mL Date/Time  Rate/Dose/Volume Action  02/17/20 1106  500 mL Given    Heparin (Porcine) in NaCl 1000-0.9 UT/500ML-% SOLN (mL) Total volume:  500 mL Date/Time  Rate/Dose/Volume Action   02/17/20 1106  500 mL Given    midazolam (VERSED) injection (mg) Total dose:  2 mg Date/Time  Rate/Dose/Volume Action  02/17/20 1121  2 mg Given    fentaNYL (SUBLIMAZE) injection (mcg) Total dose:  25 mcg Date/Time  Rate/Dose/Volume Action  02/17/20 1121  25 mcg Given    lidocaine (PF) (XYLOCAINE) 1 % injection (mL) Total volume:  2 mL Date/Time  Rate/Dose/Volume Action  02/17/20 1135  2 mL Given    atropine 1 MG/10ML injection (mg) Total dose:  0.5 mg Date/Time  Rate/Dose/Volume Action  02/17/20 1137  0.5 mg Given    Radial Cocktail/Verapamil only (mL) Total volume:  0 mL Date/Time  Rate/Dose/Volume Action  02/17/20    Canceled Entry    heparin sodium (porcine) injection (Units) Total dose:  4,000 Units Date/Time  Rate/Dose/Volume Action  02/17/20 1143  4,000 Units Given    iohexol (OMNIPAQUE) 350 MG/ML injection (mL) Total volume:  35 mL Date/Time  Rate/Dose/Volume Action  02/17/20 1201  35 mL Given    Sedation Time  Sedation Time Physician-1: 32 minutes  Contrast  Medication Name Total Dose  iohexol (OMNIPAQUE) 350 MG/ML injection 35 mL    Radiation/Fluoro  Fluoro time: 10 (min) DAP: 14990 (mGycm2) Cumulative Air Kerma: 228 (mGy)  Coronary Findings  Diagnostic Dominance: Right Left Main  Vessel was injected. Vessel is normal in caliber. Vessel is angiographically normal.  Left Anterior Descending  Vessel was injected. Vessel is normal in caliber. Vessel is angiographically normal.  Left Circumflex  Vessel was injected. Vessel is normal in caliber. Vessel is angiographically normal.  Right Coronary Artery  Vessel was injected. Vessel is normal in caliber. Vessel is angiographically normal.  Intervention  No interventions have been documented. Right Heart  Right Heart Pressures Normal right heart pressure and normal wedge pressure. RA 7/5, mean 2; RV 20/0, EDP 3; PA 19/0, mean 11 mmHg. PA saturation 72%, aortic saturation  99%. CO 4.22, CI 2.29 by Fick. QP/QS 1.00.  Coronary Diagrams  Diagnostic Dominance: Right  Intervention  Implants      No implant documentation for this case.  Syngo Images  Show images for CARDIAC CATHETERIZATION Images on Long Term Storage  Show images for [place], Ground Link to Procedure Log  Procedure Log    Hemo Data   Most Recent Value  Fick Cardiac Output 4.22 L/min  Fick Cardiac Output Index 2.29 (L/min)/BSA  RA A Wave 7 mmHg  RA V Wave 5 mmHg  RA Mean 2 mmHg  RV Systolic Pressure 20 mmHg  RV Diastolic Pressure 0 mmHg  RV EDP 3 mmHg  PA Systolic Pressure 19 mmHg  PA Diastolic Pressure 0 mmHg  PA Mean 11 mmHg  PW A Wave 16 mmHg  PW V Wave 15 mmHg  PW Mean 14 mmHg  AO Systolic Pressure 89 mmHg  AO Diastolic Pressure 46 mmHg  AO Mean 65 mmHg  QP/QS 1  TPVR Index 4.8 HRUI  TSVR Index 28.37 HRUI  TPVR/TSVR Ratio 0.17   Cardiac TAVR CT  TECHNIQUE: The patient was scanned on a Siemens Force 009 slice scanner. A 120 kV retrospective scan was triggered in the descending thoracic aorta at 111 HU's. Gantry rotation speed was 270 msecs and collimation was .9 mm. No beta blockade or nitro were given. The 3D data set was reconstructed in 5% intervals of the R-R cycle. Systolic and diastolic phases were analyzed on a dedicated work station using MPR, MIP and VRT modes. The patient received 166mL OMNIPAQUE IOHEXOL 350 MG/ML SOLN of contrast.  FINDINGS: Aortic Valve: Bicuspid aortic valve with fusion of right and non coronary cusps and calcified raphe. Severely reduced cusp separation. Moderately thickened, severely calcified aortic valve cusps.  AV calcium score: 5938  Virtual Basal Annulus Measurements:  Maximum/Minimum Diameter: 30.4 x 27.4 mm  Perimeter: 88.4 mm  Area: 602 mm2  No significant LVOT calcifications.  Based on these measurements, the annulus would be suitable for a 29 mm Sapien 3 valve.  Sinus of  Valsalva Measurements:  Sinus measures 42 mm x 34 mm measured from commissures. See detailed aorta measurements below.  Sinus of Valsalva Height:  Left: 28.1 mm  Right: 20.3 mm  Aorta: Conventional 3 vessel branch pattern of aortic arch. No coarctation of the aorta.  Measurements made in double oblique technique:  Sinus of Valsalva:  N-L: 44 mm  R-L: 44 mm  Fused cusp N-R: 37 mm  ST Junction: 42 x 40 mm  Mid ascending aorta (at PA bifurcation): 52 mm  Distal ascending aorta (just proximal to right brachiocephalic artery): 47 mm  Arch (between left common carotid and left subclavian arteries): 31 mm  Proximal descending aorta: 28 mm  Mid descending aorta (at level of pulmonary veins): 28 mm  Distal descending aorta (just proximal to aortic hiatus): 27 mm  Coronary Artery Height above Annulus:  Left Main: 20 mm  Right Coronary: 16.3 mm  Coronary Arteries: Normal origins. LAD and RCA calcifications.  Optimum Fluoroscopic Angle for Delivery: LAO 10, CAU 8  IMPRESSION: 1. Bicuspid aortic valve, Sievers Type I with fusion of right and non coronary cusps and calcified raphe. Severely reduced cusp separation. Moderately thickened, severely calcified aortic valve cusps.  2. AV calcium score: 5938  3. Annulus area: 602 mm2  4. Moderately dilated Sinus of Valsalva, 44 mm from fused cusp to left coronary cusp.  5. Severe dilation of mid ascending aorta, 52 mm.  Electronically Signed By: Cherlynn Kaiser On: 04/12/2020 10:13    CT ANGIOGRAPHY CHEST, ABDOMEN AND PELVIS  TECHNIQUE: Multidetector CT imaging through the chest, abdomen and pelvis was performed using the standard protocol during bolus administration of intravenous contrast. Multiplanar reconstructed images and MIPs were obtained and reviewed to evaluate the vascular anatomy.  CONTRAST: 160mL OMNIPAQUE IOHEXOL 350 MG/ML SOLN  COMPARISON: Chest CT  12/25/2019.  FINDINGS: CTA CHEST FINDINGS  Cardiovascular: Heart size is normal. There is no significant pericardial fluid, thickening or pericardial calcification. There is aortic atherosclerosis, as well as atherosclerosis of the great vessels of the mediastinum and the coronary arteries, including calcified atherosclerotic plaque in the left main, left anterior descending and right coronary arteries. Severe thickening and calcification of the aortic valve. Aneurysmal dilatation of the ascending thoracic aorta which measures up to 5.1 cm in diameter.  Mediastinum/Lymph Nodes: No pathologically enlarged mediastinal or hilar lymph nodes. Esophagus is unremarkable in appearance. No axillary lymphadenopathy.  Lungs/Pleura: No suspicious appearing pulmonary nodules or masses are noted. No acute consolidative airspace disease. No pleural effusions. Diffuse bronchial wall thickening with moderate centrilobular and paraseptal emphysema.  Musculoskeletal/Soft Tissues: There are no aggressive appearing lytic or blastic lesions noted in the visualized portions of the skeleton.  CTA ABDOMEN AND PELVIS FINDINGS  Hepatobiliary: No suspicious cystic or solid hepatic lesions. No intra or extrahepatic biliary ductal dilatation. Gallbladder is normal in appearance.  Pancreas: No pancreatic mass. No pancreatic ductal dilatation. No pancreatic or peripancreatic fluid collections or inflammatory changes.  Spleen: Unremarkable.  Adrenals/Urinary Tract: Bilateral adrenal nodules measuring 1.9 x 1.3 cm on the right and 3.1 x 1.5 cm on the left, previously characterized as adenomas. Bilateral kidneys are normal in appearance. No hydroureteronephrosis. Urinary bladder is normal in appearance.  Stomach/Bowel: Normal appearance of the stomach. No pathologic dilatation of small bowel or colon. Normal appendix.  Vascular/Lymphatic: Aortic atherosclerosis, with vascular findings and  measurements pertinent to potential TAVR procedure, as detailed below. 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 There are no aggressive appearing lytic or blastic lesions noted in the visualized portions of the skeleton.  Reproductive: Prostate gland and seminal vesicles are unremarkable in appearance.  Other: No significant volume of ascites. No pneumoperitoneum.  Musculoskeletal: There are no aggressive appearing lytic or blastic lesions noted in the visualized portions of the skeleton.  VASCULAR MEASUREMENTS PERTINENT TO TAVR:  AORTA:  Minimal Aortic Diameter-11 x 11 mm  Severity of Aortic Calcification-moderate to severe  RIGHT PELVIS:  Right Common Iliac Artery -  Minimal Diameter-6.2 x 7.8 mm  Tortuosity-mild  Calcification-moderate  Right External Iliac Artery -  Minimal Diameter-6.2 x 4.6 mm  Tortuosity-moderate  Calcification-mild  Right Common Femoral Artery -  Minimal Diameter-6.1 x 5.6 mm  Tortuosity-mild  Calcification-moderate  LEFT PELVIS:  Left Common Iliac Artery -  Minimal Diameter-8.1 x 6.1 mm  Tortuosity-mild  Calcification-moderate  Left External Iliac Artery -  Minimal Diameter-6.4 x 4.9 mm  Tortuosity-moderate  Calcification-mild  Left Common Femoral Artery -  Minimal Diameter-6.8 x 6.5 mm  Tortuosity-mild  Calcification-mild-to-moderate  Review of the MIP images confirms the above findings.  IMPRESSION: 1. Vascular findings and measurements pertinent to potential TAVR procedure, as detailed above. 2. Severe thickening and calcification of the aortic valve, compatible with the reported clinical history of severe aortic stenosis. 3. Aortic atherosclerosis, in addition to left main and 2 vessel coronary artery disease. Assessment for potential risk factor modification, dietary therapy  or pharmacologic therapy may  be warranted, if clinically indicated. 4. Aneurysmal dilatation of the ascending thoracic aorta (5.1 cm in diameter). Ascending thoracic aortic aneurysm. Recommend semi-annual imaging followup by CTA or MRA and referral to cardiothoracic surgery if not already obtained. This recommendation follows 2010 ACCF/AHA/AATS/ACR/ASA/SCA/SCAI/SIR/STS/SVM Guidelines for the Diagnosis and Management of Patients With Thoracic Aortic Disease. Circulation. 2010; 121: E703-J009. Aortic aneurysm NOS (ICD10-I71.9). 5. Diffuse bronchial wall thickening with moderate centrilobular and paraseptal emphysema; imaging findings suggestive of underlying COPD. 6. Bilateral adrenal adenomas redemonstrated. 7. Additional incidental findings, as above.   Electronically Signed By: Vinnie Langton M.D. 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 He describes stable symptoms of exertional shortness of breath consistent with chronic diastolic congestive heart failure, New York Heart Association functional class II. Symptoms may be exacerbated by the presence of significant underlying COPD.  I personally reviewed both report and images from transthoracic echocardiogram performed recently at Dr. Irven Shelling office. Echocardiogram reveals what appears to be a Sievers type I bicuspid aortic valve with severe aortic stenosis. There is severely restricted leaflet mobility with moderate to severe thickening and calcification. Peak velocity across aortic valve measured greater than 4.1 m/s corresponding to mean transvalvular gradient estimated greater than 40 mmHg. Left ventricular systolic function remains normal. The aorta appears moderately dilated and recent noncontrast CT scan of the chest was notable for maximum transverse diameter of the  ascending aorta measuring approximately 5.1 cm. Diagnostic cardiac catheterization is notable for the absence of significant coronary artery disease and revealed normal right heart pressures and hemodynamics.  Cardiac gated CT angiogram of the heart confirmed the presence of Sievers type I bicuspid aortic valve with fusion of the right and noncoronary cusps and a large single calcified raphae.  There was severely reduced cusp separation and AV calcium score of 5938.  The aortic root was not aneurysmally enlarged but the mid ascending thoracic aorta was dilated to greater than 5 cm diameter.  Patient would likely best be treated with concomitant aortic valve replacement with repair of ascending thoracic aorta. Risks associated with conventional surgery would be relatively low although the patient may be at somewhat increased risk for problems with respiratory failure due to his significant underlying chronic lung disease.   Plan:  The patientand his 2 daughters were againcounseled at length regarding treatment alternatives for management of severe symptomatic aortic stenosis with aneurysmal enlargement of the ascending aorta. Alternative approaches such as conventional aortic valve replacementwith repair of the aortic aneurysm, transcatheter aortic valve replacement, and continued medical therapy without intervention were compared and contrasted at length. The risks associated with conventional surgerywere discussed in detail, as were expectations for post-operative convalescence, alternative surgical approaches, prosthetic valve choices, and the presence of other concomitant conditions which might require intervention. Issues specific to transcatheter aortic valve replacement were discussed including questions about long term valve durability, the potential for paravalvular leak, possible increased risk of need for permanent pacemaker placement, the suitability of the patient's surgical anatomy,  and other potential technical complications related to the procedure itself.  Patient desires to proceed with aortic valve replacement and repair of ascending thoracic aorta later this week as previously scheduled.  Prior to surgery the patient will undergo formal pulmonary function testing.  This has been scheduled for tomorrow.  Discussion was held comparing the relative risks of mechanical  valve replacement with need for lifelong anticoagulation versus use of a bioprosthetic tissue valve and the associated potential for late structural valve deterioration and failure.  This discussion was placed in the context of the patient's particular circumstances, and as a result the patient specifically requests that their valve be replaced using a bioprosthetic tissue valve.    The patient understands and accepts all potential associated risks of surgery including but not limited to risk of death, stroke, myocardial infarction, congestive heart failure, respiratory failure, renal failure, pneumonia, bleeding requiring blood transfusion and or reexploration, arrhythmia, heart block or bradycardia requiring permanent pacemaker, aortic dissection or other major vascular complication, pleural effusions or other delayed complications related to continued congestive heart failure, and other late complications related to valve replacement including structural valve deterioration and failure, thrombosis, endocarditis, or paravalvular leak.      Valentina Gu. Roxy Manns, MD 04/12/2020 3:57 PM

## 2020-04-14 ENCOUNTER — Encounter (HOSPITAL_COMMUNITY): Payer: Self-pay | Admitting: Thoracic Surgery (Cardiothoracic Vascular Surgery)

## 2020-04-14 ENCOUNTER — Inpatient Hospital Stay (HOSPITAL_COMMUNITY): Payer: Medicare HMO

## 2020-04-14 ENCOUNTER — Inpatient Hospital Stay (HOSPITAL_COMMUNITY)
Admission: RE | Admit: 2020-04-14 | Discharge: 2020-04-21 | DRG: 220 | Disposition: A | Discharge status: Home / Self Care | Payer: Medicare HMO | Attending: Thoracic Surgery (Cardiothoracic Vascular Surgery) | Admitting: Thoracic Surgery (Cardiothoracic Vascular Surgery)

## 2020-04-14 ENCOUNTER — Inpatient Hospital Stay (HOSPITAL_COMMUNITY): Payer: Medicare HMO | Admitting: Certified Registered Nurse Anesthetist

## 2020-04-14 ENCOUNTER — Other Ambulatory Visit: Payer: Self-pay

## 2020-04-14 ENCOUNTER — Inpatient Hospital Stay (HOSPITAL_COMMUNITY): Payer: Medicare HMO | Admitting: Physician Assistant

## 2020-04-14 ENCOUNTER — Encounter (HOSPITAL_COMMUNITY)
Admission: RE | Disposition: A | Discharge status: Home / Self Care | Payer: Self-pay | Source: Home / Self Care | Attending: Thoracic Surgery (Cardiothoracic Vascular Surgery)

## 2020-04-14 DIAGNOSIS — D3501 Benign neoplasm of right adrenal gland: Secondary | ICD-10-CM | POA: Diagnosis present

## 2020-04-14 DIAGNOSIS — Z7289 Other problems related to lifestyle: Secondary | ICD-10-CM | POA: Diagnosis not present

## 2020-04-14 DIAGNOSIS — I2510 Atherosclerotic heart disease of native coronary artery without angina pectoris: Secondary | ICD-10-CM | POA: Diagnosis present

## 2020-04-14 DIAGNOSIS — E871 Hypo-osmolality and hyponatremia: Secondary | ICD-10-CM | POA: Diagnosis present

## 2020-04-14 DIAGNOSIS — I4519 Other right bundle-branch block: Secondary | ICD-10-CM | POA: Diagnosis not present

## 2020-04-14 DIAGNOSIS — Z79899 Other long term (current) drug therapy: Secondary | ICD-10-CM

## 2020-04-14 DIAGNOSIS — I4891 Unspecified atrial fibrillation: Secondary | ICD-10-CM | POA: Diagnosis not present

## 2020-04-14 DIAGNOSIS — D3502 Benign neoplasm of left adrenal gland: Secondary | ICD-10-CM | POA: Diagnosis present

## 2020-04-14 DIAGNOSIS — I350 Nonrheumatic aortic (valve) stenosis: Secondary | ICD-10-CM

## 2020-04-14 DIAGNOSIS — J432 Centrilobular emphysema: Secondary | ICD-10-CM | POA: Diagnosis present

## 2020-04-14 DIAGNOSIS — J9811 Atelectasis: Secondary | ICD-10-CM | POA: Diagnosis not present

## 2020-04-14 DIAGNOSIS — I7122 Aneurysm of the aortic arch, without rupture: Secondary | ICD-10-CM | POA: Diagnosis present

## 2020-04-14 DIAGNOSIS — D62 Acute posthemorrhagic anemia: Secondary | ICD-10-CM | POA: Diagnosis not present

## 2020-04-14 DIAGNOSIS — I7121 Aneurysm of the ascending aorta, without rupture: Secondary | ICD-10-CM

## 2020-04-14 DIAGNOSIS — I700 Atherosclerosis of aorta: Secondary | ICD-10-CM | POA: Diagnosis present

## 2020-04-14 DIAGNOSIS — I110 Hypertensive heart disease with heart failure: Secondary | ICD-10-CM | POA: Diagnosis present

## 2020-04-14 DIAGNOSIS — Q231 Congenital insufficiency of aortic valve: Secondary | ICD-10-CM | POA: Diagnosis not present

## 2020-04-14 DIAGNOSIS — E8770 Fluid overload, unspecified: Secondary | ICD-10-CM | POA: Diagnosis not present

## 2020-04-14 DIAGNOSIS — Z01818 Encounter for other preprocedural examination: Secondary | ICD-10-CM

## 2020-04-14 DIAGNOSIS — K668 Other specified disorders of peritoneum: Secondary | ICD-10-CM

## 2020-04-14 DIAGNOSIS — R001 Bradycardia, unspecified: Secondary | ICD-10-CM | POA: Diagnosis not present

## 2020-04-14 DIAGNOSIS — Z09 Encounter for follow-up examination after completed treatment for conditions other than malignant neoplasm: Secondary | ICD-10-CM

## 2020-04-14 DIAGNOSIS — R911 Solitary pulmonary nodule: Secondary | ICD-10-CM | POA: Diagnosis present

## 2020-04-14 DIAGNOSIS — F17210 Nicotine dependence, cigarettes, uncomplicated: Secondary | ICD-10-CM | POA: Diagnosis present

## 2020-04-14 DIAGNOSIS — E785 Hyperlipidemia, unspecified: Secondary | ICD-10-CM | POA: Diagnosis present

## 2020-04-14 DIAGNOSIS — I5032 Chronic diastolic (congestive) heart failure: Secondary | ICD-10-CM | POA: Diagnosis present

## 2020-04-14 DIAGNOSIS — I712 Thoracic aortic aneurysm, without rupture: Secondary | ICD-10-CM | POA: Diagnosis present

## 2020-04-14 DIAGNOSIS — D696 Thrombocytopenia, unspecified: Secondary | ICD-10-CM | POA: Diagnosis present

## 2020-04-14 DIAGNOSIS — Z953 Presence of xenogenic heart valve: Secondary | ICD-10-CM

## 2020-04-14 DIAGNOSIS — Z20822 Contact with and (suspected) exposure to covid-19: Secondary | ICD-10-CM | POA: Diagnosis present

## 2020-04-14 DIAGNOSIS — Z8679 Personal history of other diseases of the circulatory system: Secondary | ICD-10-CM

## 2020-04-14 DIAGNOSIS — Z9889 Other specified postprocedural states: Secondary | ICD-10-CM

## 2020-04-14 HISTORY — PX: AORTIC VALVE REPLACEMENT: SHX41

## 2020-04-14 HISTORY — DX: Presence of xenogenic heart valve: Z95.3

## 2020-04-14 HISTORY — PX: TEE WITHOUT CARDIOVERSION: SHX5443

## 2020-04-14 HISTORY — PX: THORACIC AORTIC ANEURYSM REPAIR: SHX799

## 2020-04-14 HISTORY — DX: Personal history of other diseases of the circulatory system: Z86.79

## 2020-04-14 HISTORY — PX: BENTALL PROCEDURE: SHX5058

## 2020-04-14 LAB — CBC
HCT: 35.9 % — ABNORMAL LOW (ref 39.0–52.0)
HCT: 34.2 % — ABNORMAL LOW (ref 39.0–52.0)
Hemoglobin: 12.1 g/dL — ABNORMAL LOW (ref 13.0–17.0)
Hemoglobin: 11.3 g/dL — ABNORMAL LOW (ref 13.0–17.0)
MCH: 31.3 pg (ref 26.0–34.0)
MCH: 30.9 pg (ref 26.0–34.0)
MCHC: 33.7 g/dL (ref 30.0–36.0)
MCHC: 33 g/dL (ref 30.0–36.0)
MCV: 92.8 fL (ref 80.0–100.0)
MCV: 93.4 fL (ref 80.0–100.0)
Platelets: 89 10*3/uL — ABNORMAL LOW (ref 150–400)
Platelets: 94 10*3/uL — ABNORMAL LOW (ref 150–400)
RBC: 3.87 MIL/uL — ABNORMAL LOW (ref 4.22–5.81)
RBC: 3.66 MIL/uL — ABNORMAL LOW (ref 4.22–5.81)
RDW: 13.3 % (ref 11.5–15.5)
RDW: 13.5 % (ref 11.5–15.5)
WBC: 4.8 10*3/uL (ref 4.0–10.5)
WBC: 5.5 10*3/uL (ref 4.0–10.5)
nRBC: 0 % (ref 0.0–0.2)
nRBC: 0 % (ref 0.0–0.2)

## 2020-04-14 LAB — POCT I-STAT, CHEM 8
BUN: 9 mg/dL (ref 8–23)
BUN: 8 mg/dL (ref 8–23)
BUN: 7 mg/dL — ABNORMAL LOW (ref 8–23)
BUN: 8 mg/dL (ref 8–23)
BUN: 7 mg/dL — ABNORMAL LOW (ref 8–23)
Calcium, Ion: 1.23 mmol/L (ref 1.15–1.40)
Calcium, Ion: 1.23 mmol/L (ref 1.15–1.40)
Calcium, Ion: 1.04 mmol/L — ABNORMAL LOW (ref 1.15–1.40)
Calcium, Ion: 1.04 mmol/L — ABNORMAL LOW (ref 1.15–1.40)
Calcium, Ion: 1.05 mmol/L — ABNORMAL LOW (ref 1.15–1.40)
Chloride: 98 mmol/L (ref 98–111)
Chloride: 100 mmol/L (ref 98–111)
Chloride: 99 mmol/L (ref 98–111)
Chloride: 99 mmol/L (ref 98–111)
Chloride: 100 mmol/L (ref 98–111)
Creatinine, Ser: 0.8 mg/dL (ref 0.61–1.24)
Creatinine, Ser: 0.8 mg/dL (ref 0.61–1.24)
Creatinine, Ser: 0.7 mg/dL (ref 0.61–1.24)
Creatinine, Ser: 0.7 mg/dL (ref 0.61–1.24)
Creatinine, Ser: 0.6 mg/dL — ABNORMAL LOW (ref 0.61–1.24)
Glucose, Bld: 99 mg/dL (ref 70–99)
Glucose, Bld: 102 mg/dL — ABNORMAL HIGH (ref 70–99)
Glucose, Bld: 150 mg/dL — ABNORMAL HIGH (ref 70–99)
Glucose, Bld: 155 mg/dL — ABNORMAL HIGH (ref 70–99)
Glucose, Bld: 132 mg/dL — ABNORMAL HIGH (ref 70–99)
HCT: 36 % — ABNORMAL LOW (ref 39.0–52.0)
HCT: 36 % — ABNORMAL LOW (ref 39.0–52.0)
HCT: 25 % — ABNORMAL LOW (ref 39.0–52.0)
HCT: 25 % — ABNORMAL LOW (ref 39.0–52.0)
HCT: 25 % — ABNORMAL LOW (ref 39.0–52.0)
Hemoglobin: 12.2 g/dL — ABNORMAL LOW (ref 13.0–17.0)
Hemoglobin: 12.2 g/dL — ABNORMAL LOW (ref 13.0–17.0)
Hemoglobin: 8.5 g/dL — ABNORMAL LOW (ref 13.0–17.0)
Hemoglobin: 8.5 g/dL — ABNORMAL LOW (ref 13.0–17.0)
Hemoglobin: 8.5 g/dL — ABNORMAL LOW (ref 13.0–17.0)
Potassium: 3.9 mmol/L (ref 3.5–5.1)
Potassium: 4.1 mmol/L (ref 3.5–5.1)
Potassium: 4 mmol/L (ref 3.5–5.1)
Potassium: 4 mmol/L (ref 3.5–5.1)
Potassium: 3.6 mmol/L (ref 3.5–5.1)
Sodium: 134 mmol/L — ABNORMAL LOW (ref 135–145)
Sodium: 137 mmol/L (ref 135–145)
Sodium: 134 mmol/L — ABNORMAL LOW (ref 135–145)
Sodium: 135 mmol/L (ref 135–145)
Sodium: 138 mmol/L (ref 135–145)
TCO2: 25 mmol/L (ref 22–32)
TCO2: 27 mmol/L (ref 22–32)
TCO2: 23 mmol/L (ref 22–32)
TCO2: 24 mmol/L (ref 22–32)
TCO2: 24 mmol/L (ref 22–32)

## 2020-04-14 LAB — COMPREHENSIVE METABOLIC PANEL
ALT: 14 U/L (ref 0–44)
AST: 27 U/L (ref 15–41)
Albumin: 4 g/dL (ref 3.5–5.0)
Alkaline Phosphatase: 21 U/L — ABNORMAL LOW (ref 38–126)
Anion gap: 9 (ref 5–15)
BUN: 8 mg/dL (ref 8–23)
CO2: 19 mmol/L — ABNORMAL LOW (ref 22–32)
Calcium: 7.9 mg/dL — ABNORMAL LOW (ref 8.9–10.3)
Chloride: 108 mmol/L (ref 98–111)
Creatinine, Ser: 0.95 mg/dL (ref 0.61–1.24)
GFR calc Af Amer: >60 mL/min (ref >60)
GFR calc non Af Amer: >60 mL/min (ref >60)
Glucose, Bld: 132 mg/dL — ABNORMAL HIGH (ref 70–99)
Potassium: 4.9 mmol/L (ref 3.5–5.1)
Sodium: 136 mmol/L (ref 135–145)
Total Bilirubin: 1.4 mg/dL — ABNORMAL HIGH (ref 0.3–1.2)
Total Protein: 5 g/dL — ABNORMAL LOW (ref 6.5–8.1)

## 2020-04-14 LAB — POCT I-STAT 7, (LYTES, BLD GAS, ICA,H+H)
Acid-Base Excess: 0 mmol/L (ref 0.0–2.0)
Acid-Base Excess: 2 mmol/L (ref 0.0–2.0)
Acid-Base Excess: 2 mmol/L (ref 0.0–2.0)
Acid-Base Excess: 1 mmol/L (ref 0.0–2.0)
Acid-Base Excess: 0 mmol/L (ref 0.0–2.0)
Acid-base deficit: 3 mmol/L — ABNORMAL HIGH (ref 0.0–2.0)
Acid-base deficit: 3 mmol/L — ABNORMAL HIGH (ref 0.0–2.0)
Acid-base deficit: 3 mmol/L — ABNORMAL HIGH (ref 0.0–2.0)
Bicarbonate: 26.8 mmol/L (ref 20.0–28.0)
Bicarbonate: 26.6 mmol/L (ref 20.0–28.0)
Bicarbonate: 25.8 mmol/L (ref 20.0–28.0)
Bicarbonate: 25.4 mmol/L (ref 20.0–28.0)
Bicarbonate: 25.9 mmol/L (ref 20.0–28.0)
Bicarbonate: 24 mmol/L (ref 20.0–28.0)
Bicarbonate: 22.3 mmol/L (ref 20.0–28.0)
Bicarbonate: 22.7 mmol/L (ref 20.0–28.0)
Calcium, Ion: 1.21 mmol/L (ref 1.15–1.40)
Calcium, Ion: 0.95 mmol/L — ABNORMAL LOW (ref 1.15–1.40)
Calcium, Ion: 1.03 mmol/L — ABNORMAL LOW (ref 1.15–1.40)
Calcium, Ion: 1.02 mmol/L — ABNORMAL LOW (ref 1.15–1.40)
Calcium, Ion: 1.03 mmol/L — ABNORMAL LOW (ref 1.15–1.40)
Calcium, Ion: 1.36 mmol/L (ref 1.15–1.40)
Calcium, Ion: 1.15 mmol/L (ref 1.15–1.40)
Calcium, Ion: 1.16 mmol/L (ref 1.15–1.40)
HCT: 35 % — ABNORMAL LOW (ref 39.0–52.0)
HCT: 27 % — ABNORMAL LOW (ref 39.0–52.0)
HCT: 26 % — ABNORMAL LOW (ref 39.0–52.0)
HCT: 25 % — ABNORMAL LOW (ref 39.0–52.0)
HCT: 23 % — ABNORMAL LOW (ref 39.0–52.0)
HCT: 34 % — ABNORMAL LOW (ref 39.0–52.0)
HCT: 31 % — ABNORMAL LOW (ref 39.0–52.0)
HCT: 31 % — ABNORMAL LOW (ref 39.0–52.0)
Hemoglobin: 11.9 g/dL — ABNORMAL LOW (ref 13.0–17.0)
Hemoglobin: 9.2 g/dL — ABNORMAL LOW (ref 13.0–17.0)
Hemoglobin: 8.8 g/dL — ABNORMAL LOW (ref 13.0–17.0)
Hemoglobin: 8.5 g/dL — ABNORMAL LOW (ref 13.0–17.0)
Hemoglobin: 7.8 g/dL — ABNORMAL LOW (ref 13.0–17.0)
Hemoglobin: 11.6 g/dL — ABNORMAL LOW (ref 13.0–17.0)
Hemoglobin: 10.5 g/dL — ABNORMAL LOW (ref 13.0–17.0)
Hemoglobin: 10.5 g/dL — ABNORMAL LOW (ref 13.0–17.0)
O2 Saturation: 100 %
O2 Saturation: 100 %
O2 Saturation: 100 %
O2 Saturation: 100 %
O2 Saturation: 100 %
O2 Saturation: 96 %
O2 Saturation: 97 %
O2 Saturation: 99 %
Patient temperature: 35.5
Patient temperature: 37.6
Patient temperature: 37.8
Potassium: 3.9 mmol/L (ref 3.5–5.1)
Potassium: 4.3 mmol/L (ref 3.5–5.1)
Potassium: 4.5 mmol/L (ref 3.5–5.1)
Potassium: 3.9 mmol/L (ref 3.5–5.1)
Potassium: 3.6 mmol/L (ref 3.5–5.1)
Potassium: 3.5 mmol/L (ref 3.5–5.1)
Potassium: 5.1 mmol/L (ref 3.5–5.1)
Potassium: 5.1 mmol/L (ref 3.5–5.1)
Sodium: 135 mmol/L (ref 135–145)
Sodium: 138 mmol/L (ref 135–145)
Sodium: 134 mmol/L — ABNORMAL LOW (ref 135–145)
Sodium: 136 mmol/L (ref 135–145)
Sodium: 138 mmol/L (ref 135–145)
Sodium: 141 mmol/L (ref 135–145)
Sodium: 138 mmol/L (ref 135–145)
Sodium: 138 mmol/L (ref 135–145)
TCO2: 28 mmol/L (ref 22–32)
TCO2: 28 mmol/L (ref 22–32)
TCO2: 27 mmol/L (ref 22–32)
TCO2: 27 mmol/L (ref 22–32)
TCO2: 27 mmol/L (ref 22–32)
TCO2: 25 mmol/L (ref 22–32)
TCO2: 24 mmol/L (ref 22–32)
TCO2: 24 mmol/L (ref 22–32)
pCO2 arterial: 50.2 mmHg — ABNORMAL HIGH (ref 32.0–48.0)
pCO2 arterial: 41.6 mmHg (ref 32.0–48.0)
pCO2 arterial: 36.4 mmHg (ref 32.0–48.0)
pCO2 arterial: 37.7 mmHg (ref 32.0–48.0)
pCO2 arterial: 45.4 mmHg (ref 32.0–48.0)
pCO2 arterial: 45.4 mmHg (ref 32.0–48.0)
pCO2 arterial: 42.8 mmHg (ref 32.0–48.0)
pCO2 arterial: 44.3 mmHg (ref 32.0–48.0)
pH, Arterial: 7.335 — ABNORMAL LOW (ref 7.350–7.450)
pH, Arterial: 7.414 (ref 7.350–7.450)
pH, Arterial: 7.459 — ABNORMAL HIGH (ref 7.350–7.450)
pH, Arterial: 7.437 (ref 7.350–7.450)
pH, Arterial: 7.364 (ref 7.350–7.450)
pH, Arterial: 7.324 — ABNORMAL LOW (ref 7.350–7.450)
pH, Arterial: 7.327 — ABNORMAL LOW (ref 7.350–7.450)
pH, Arterial: 7.322 — ABNORMAL LOW (ref 7.350–7.450)
pO2, Arterial: 394 mmHg — ABNORMAL HIGH (ref 83.0–108.0)
pO2, Arterial: 414 mmHg — ABNORMAL HIGH (ref 83.0–108.0)
pO2, Arterial: 314 mmHg — ABNORMAL HIGH (ref 83.0–108.0)
pO2, Arterial: 297 mmHg — ABNORMAL HIGH (ref 83.0–108.0)
pO2, Arterial: 435 mmHg — ABNORMAL HIGH (ref 83.0–108.0)
pO2, Arterial: 85 mmHg (ref 83.0–108.0)
pO2, Arterial: 104 mmHg (ref 83.0–108.0)
pO2, Arterial: 129 mmHg — ABNORMAL HIGH (ref 83.0–108.0)

## 2020-04-14 LAB — GLUCOSE, CAPILLARY
Glucose-Capillary: 113 mg/dL — ABNORMAL HIGH (ref 70–99)
Glucose-Capillary: 108 mg/dL — ABNORMAL HIGH (ref 70–99)
Glucose-Capillary: 115 mg/dL — ABNORMAL HIGH (ref 70–99)
Glucose-Capillary: 125 mg/dL — ABNORMAL HIGH (ref 70–99)
Glucose-Capillary: 120 mg/dL — ABNORMAL HIGH (ref 70–99)
Glucose-Capillary: 161 mg/dL — ABNORMAL HIGH (ref 70–99)
Glucose-Capillary: 152 mg/dL — ABNORMAL HIGH (ref 70–99)
Glucose-Capillary: 147 mg/dL — ABNORMAL HIGH (ref 70–99)

## 2020-04-14 LAB — HEMOGLOBIN AND HEMATOCRIT, BLOOD
HCT: 26.6 % — ABNORMAL LOW (ref 39.0–52.0)
Hemoglobin: 9 g/dL — ABNORMAL LOW (ref 13.0–17.0)

## 2020-04-14 LAB — PROTIME-INR
INR: 2 — ABNORMAL HIGH (ref 0.8–1.2)
Prothrombin Time: 21.7 seconds — ABNORMAL HIGH (ref 11.4–15.2)

## 2020-04-14 LAB — PHOSPHORUS: Phosphorus: 3.1 mg/dL (ref 2.5–4.6)

## 2020-04-14 LAB — ECHO INTRAOPERATIVE TEE
AV Mean grad: 40 mmHg
Height: 68 in
Weight: 2433.88 oz

## 2020-04-14 LAB — MAGNESIUM: Magnesium: 2.8 mg/dL — ABNORMAL HIGH (ref 1.7–2.4)

## 2020-04-14 LAB — ABO/RH: ABO/RH(D): A POS

## 2020-04-14 LAB — PLATELET COUNT: Platelets: 103 10*3/uL — ABNORMAL LOW (ref 150–400)

## 2020-04-14 LAB — FIBRINOGEN: Fibrinogen: 141 mg/dL — ABNORMAL LOW (ref 210–475)

## 2020-04-14 LAB — APTT: aPTT: 45 seconds — ABNORMAL HIGH (ref 24–36)

## 2020-04-14 SURGERY — REPAIR, ANEURYSM, AORTA, THORACIC, ASCENDING
Anesthesia: General | Site: Chest

## 2020-04-14 MED ORDER — ASPIRIN 81 MG PO CHEW: 324.0000 mg | CHEWABLE_TABLET | Freq: Every day | ORAL | Status: DC

## 2020-04-14 MED ORDER — LACTATED RINGERS IV SOLN: INTRAVENOUS | Status: DC

## 2020-04-14 MED ORDER — NITROGLYCERIN IN D5W 200-5 MCG/ML-% IV SOLN
0.0000 ug/min | INTRAVENOUS | Status: DC
  Administered 2020-04-14: 15 ug/min via INTRAVENOUS

## 2020-04-14 MED ORDER — MIDAZOLAM HCL 2 MG/2ML IJ SOLN
INTRAMUSCULAR | Status: AC
  Filled 2020-04-14: qty 2

## 2020-04-14 MED ORDER — EPHEDRINE 5 MG/ML INJ
INTRAVENOUS | Status: AC
  Filled 2020-04-14: qty 10

## 2020-04-14 MED ORDER — EPHEDRINE SULFATE 50 MG/ML IJ SOLN
INTRAMUSCULAR | Status: DC | PRN
  Administered 2020-04-14: 5 mg via INTRAVENOUS
  Administered 2020-04-14: 10 mg via INTRAVENOUS

## 2020-04-14 MED ORDER — PHENYLEPHRINE HCL-NACL 20-0.9 MG/250ML-% IV SOLN: 0.0000 ug/min | INTRAVENOUS | Status: DC

## 2020-04-14 MED ORDER — FAMOTIDINE IN NACL 20-0.9 MG/50ML-% IV SOLN
20.0000 mg | Freq: Two times a day (BID) | INTRAVENOUS | Status: DC
  Administered 2020-04-14: 20 mg via INTRAVENOUS

## 2020-04-14 MED ORDER — LACTATED RINGERS IV SOLN: INTRAVENOUS | Status: DC | PRN

## 2020-04-14 MED ORDER — SODIUM CHLORIDE 0.9% FLUSH: 3.0000 mL | INTRAVENOUS | Status: DC | PRN

## 2020-04-14 MED ORDER — MORPHINE SULFATE (PF) 2 MG/ML IV SOLN
1.0000 mg | INTRAVENOUS | Status: DC | PRN
  Administered 2020-04-17: 2 mg via INTRAVENOUS
  Filled 2020-04-14: qty 1

## 2020-04-14 MED ORDER — DOCUSATE SODIUM 100 MG PO CAPS
200.0000 mg | ORAL_CAPSULE | Freq: Every day | ORAL | Status: DC
  Administered 2020-04-15 – 2020-04-18 (×4): 200 mg via ORAL
  Filled 2020-04-14 (×4): qty 2

## 2020-04-14 MED ORDER — ALBUMIN HUMAN 5 % IV SOLN: INTRAVENOUS | Status: DC | PRN

## 2020-04-14 MED ORDER — SODIUM CHLORIDE (PF) 0.9 % IJ SOLN
INTRAMUSCULAR | Status: AC
  Filled 2020-04-14: qty 10

## 2020-04-14 MED ORDER — HEPARIN SODIUM (PORCINE) 1000 UNIT/ML IJ SOLN
INTRAMUSCULAR | Status: DC | PRN
  Administered 2020-04-14: 24000 [IU] via INTRAVENOUS

## 2020-04-14 MED ORDER — SODIUM CHLORIDE 0.9 % IV SOLN: INTRAVENOUS | Status: DC

## 2020-04-14 MED ORDER — ACETAMINOPHEN 160 MG/5ML PO SOLN: 1000.0000 mg | Freq: Four times a day (QID) | ORAL | Status: DC

## 2020-04-14 MED ORDER — FENTANYL CITRATE (PF) 250 MCG/5ML IJ SOLN
INTRAMUSCULAR | Status: AC
  Filled 2020-04-14: qty 25

## 2020-04-14 MED ORDER — SODIUM CHLORIDE 0.9 % IR SOLN
Status: DC | PRN
  Administered 2020-04-14: 3000 mL

## 2020-04-14 MED ORDER — CLEVIDIPINE BUTYRATE 0.5 MG/ML IV EMUL
INTRAVENOUS | Status: DC | PRN
  Administered 2020-04-14: 2 mg/h via INTRAVENOUS

## 2020-04-14 MED ORDER — VASOPRESSIN 20 UNIT/ML IV SOLN
INTRAVENOUS | Status: AC
  Filled 2020-04-14: qty 1

## 2020-04-14 MED ORDER — PHENYLEPHRINE HCL-NACL 10-0.9 MG/250ML-% IV SOLN
INTRAVENOUS | Status: DC | PRN
  Administered 2020-04-14: 10 ug/min via INTRAVENOUS

## 2020-04-14 MED ORDER — CHLORHEXIDINE GLUCONATE 4 % EX LIQD: 30.0000 mL | CUTANEOUS | Status: DC

## 2020-04-14 MED ORDER — ACETAMINOPHEN 650 MG RE SUPP
650.0000 mg | Freq: Once | RECTAL | Status: AC
  Administered 2020-04-14: 650 mg via RECTAL

## 2020-04-14 MED ORDER — CHLORHEXIDINE GLUCONATE 0.12 % MT SOLN
15.0000 mL | Freq: Once | OROMUCOSAL | Status: AC
  Administered 2020-04-14: 15 mL via OROMUCOSAL

## 2020-04-14 MED ORDER — CHLORHEXIDINE GLUCONATE CLOTH 2 % EX PADS
6.0000 | MEDICATED_PAD | Freq: Every day | CUTANEOUS | Status: DC
  Administered 2020-04-14 – 2020-04-18 (×5): 6 via TOPICAL

## 2020-04-14 MED ORDER — MIDAZOLAM HCL (PF) 10 MG/2ML IJ SOLN
INTRAMUSCULAR | Status: AC
  Filled 2020-04-14: qty 2

## 2020-04-14 MED ORDER — SODIUM CHLORIDE 0.9% FLUSH
3.0000 mL | Freq: Two times a day (BID) | INTRAVENOUS | Status: DC
  Administered 2020-04-15 – 2020-04-18 (×5): 3 mL via INTRAVENOUS

## 2020-04-14 MED ORDER — HEPARIN SODIUM (PORCINE) 1000 UNIT/ML IJ SOLN
INTRAMUSCULAR | Status: AC
  Filled 2020-04-14: qty 1

## 2020-04-14 MED ORDER — LACTATED RINGERS IV SOLN: 500.0000 mL | Freq: Once | INTRAVENOUS | Status: DC | PRN

## 2020-04-14 MED ORDER — HEMOSTATIC AGENTS (NO CHARGE) OPTIME
TOPICAL | Status: DC | PRN
  Administered 2020-04-14: 1 via TOPICAL

## 2020-04-14 MED ORDER — SODIUM CHLORIDE 0.9 % IV SOLN: 250.0000 mL | INTRAVENOUS | Status: DC

## 2020-04-14 MED ORDER — PROPOFOL 10 MG/ML IV BOLUS
INTRAVENOUS | Status: DC | PRN
  Administered 2020-04-14: 40 mg via INTRAVENOUS
  Administered 2020-04-14: 80 mg via INTRAVENOUS

## 2020-04-14 MED ORDER — ACETAMINOPHEN 160 MG/5ML PO SOLN: 650.0000 mg | Freq: Once | ORAL | Status: AC

## 2020-04-14 MED ORDER — MIDAZOLAM HCL 5 MG/5ML IJ SOLN
INTRAMUSCULAR | Status: DC | PRN
  Administered 2020-04-14: 5 mg via INTRAVENOUS
  Administered 2020-04-14: 1 mg via INTRAVENOUS
  Administered 2020-04-14 (×2): 2 mg via INTRAVENOUS

## 2020-04-14 MED ORDER — CHLORHEXIDINE GLUCONATE 0.12 % MT SOLN
15.0000 mL | OROMUCOSAL | Status: AC
  Administered 2020-04-14: 15 mL via OROMUCOSAL

## 2020-04-14 MED ORDER — ROCURONIUM BROMIDE 10 MG/ML (PF) SYRINGE
PREFILLED_SYRINGE | INTRAVENOUS | Status: DC | PRN
  Administered 2020-04-14: 70 mg via INTRAVENOUS
  Administered 2020-04-14: 100 mg via INTRAVENOUS
  Administered 2020-04-14: 30 mg via INTRAVENOUS

## 2020-04-14 MED ORDER — MIDAZOLAM HCL 2 MG/2ML IJ SOLN
2.0000 mg | INTRAMUSCULAR | Status: DC | PRN
  Administered 2020-04-14: 2 mg via INTRAVENOUS
  Filled 2020-04-14: qty 2

## 2020-04-14 MED ORDER — 0.9 % SODIUM CHLORIDE (POUR BTL) OPTIME
TOPICAL | Status: DC | PRN
  Administered 2020-04-14: 5000 mL

## 2020-04-14 MED ORDER — PROTAMINE SULFATE 10 MG/ML IV SOLN
INTRAVENOUS | Status: DC | PRN
  Administered 2020-04-14 (×8): 30 mg via INTRAVENOUS

## 2020-04-14 MED ORDER — ROCURONIUM BROMIDE 10 MG/ML (PF) SYRINGE
PREFILLED_SYRINGE | INTRAVENOUS | Status: AC
  Filled 2020-04-14: qty 20

## 2020-04-14 MED ORDER — PANTOPRAZOLE SODIUM 40 MG PO TBEC
40.0000 mg | DELAYED_RELEASE_TABLET | Freq: Every day | ORAL | Status: DC
  Administered 2020-04-16 – 2020-04-18 (×3): 40 mg via ORAL
  Filled 2020-04-14 (×3): qty 1

## 2020-04-14 MED ORDER — BISACODYL 5 MG PO TBEC
10.0000 mg | DELAYED_RELEASE_TABLET | Freq: Every day | ORAL | Status: DC
  Administered 2020-04-15 – 2020-04-18 (×4): 10 mg via ORAL
  Filled 2020-04-14 (×4): qty 2

## 2020-04-14 MED ORDER — FENTANYL CITRATE (PF) 250 MCG/5ML IJ SOLN
INTRAMUSCULAR | Status: DC | PRN
Start: 2020-04-14 — End: 2020-04-14
  Administered 2020-04-14: 50 ug via INTRAVENOUS
  Administered 2020-04-14: 100 ug via INTRAVENOUS
  Administered 2020-04-14: 250 ug via INTRAVENOUS
  Administered 2020-04-14: 150 ug via INTRAVENOUS
  Administered 2020-04-14 (×2): 250 ug via INTRAVENOUS
  Administered 2020-04-14: 100 ug via INTRAVENOUS

## 2020-04-14 MED ORDER — SODIUM CHLORIDE (PF) 0.9 % IJ SOLN
OROMUCOSAL | Status: DC | PRN
  Administered 2020-04-14 (×3): 4 mL via TOPICAL

## 2020-04-14 MED ORDER — MAGNESIUM SULFATE 4 GM/100ML IV SOLN
4.0000 g | Freq: Once | INTRAVENOUS | Status: AC
  Administered 2020-04-14: 4 g via INTRAVENOUS

## 2020-04-14 MED ORDER — VANCOMYCIN HCL 1000 MG IV SOLR
INTRAVENOUS | Status: DC | PRN
  Administered 2020-04-14: 1000 mL

## 2020-04-14 MED ORDER — SODIUM CHLORIDE 0.45 % IV SOLN: INTRAVENOUS | Status: DC | PRN

## 2020-04-14 MED ORDER — PROPOFOL 10 MG/ML IV BOLUS
INTRAVENOUS | Status: AC
  Filled 2020-04-14: qty 20

## 2020-04-14 MED ORDER — ACETAMINOPHEN 500 MG PO TABS
1000.0000 mg | ORAL_TABLET | Freq: Four times a day (QID) | ORAL | Status: DC
  Administered 2020-04-14 – 2020-04-18 (×14): 1000 mg via ORAL
  Filled 2020-04-14 (×14): qty 2

## 2020-04-14 MED ORDER — ALBUMIN HUMAN 5 % IV SOLN
250.0000 mL | INTRAVENOUS | Status: AC | PRN
  Administered 2020-04-14 (×2): 12.5 g via INTRAVENOUS

## 2020-04-14 MED ORDER — INSULIN REGULAR(HUMAN) IN NACL 100-0.9 UT/100ML-% IV SOLN: INTRAVENOUS | Status: DC

## 2020-04-14 MED ORDER — ASPIRIN EC 325 MG PO TBEC
325.0000 mg | DELAYED_RELEASE_TABLET | Freq: Every day | ORAL | Status: DC
  Administered 2020-04-15 – 2020-04-18 (×4): 325 mg via ORAL
  Filled 2020-04-14 (×4): qty 1

## 2020-04-14 MED ORDER — METOPROLOL TARTRATE 12.5 MG HALF TABLET: 12.5000 mg | ORAL_TABLET | Freq: Once | ORAL | Status: DC

## 2020-04-14 MED ORDER — SODIUM CHLORIDE 0.9% FLUSH: 10.0000 mL | INTRAVENOUS | Status: DC | PRN

## 2020-04-14 MED ORDER — TRAMADOL HCL 50 MG PO TABS
50.0000 mg | ORAL_TABLET | ORAL | Status: DC | PRN
  Administered 2020-04-17: 50 mg via ORAL
  Filled 2020-04-14: qty 1

## 2020-04-14 MED ORDER — DEXTROSE 50 % IV SOLN: 0.0000 mL | INTRAVENOUS | Status: DC | PRN

## 2020-04-14 MED ORDER — PLASMA-LYTE 148 IV SOLN
INTRAVENOUS | Status: DC | PRN
  Administered 2020-04-14: 500 mL

## 2020-04-14 MED ORDER — SODIUM CHLORIDE 0.9 % IV SOLN
1.5000 g | Freq: Two times a day (BID) | INTRAVENOUS | Status: AC
  Administered 2020-04-14 – 2020-04-16 (×4): 1.5 g via INTRAVENOUS
  Filled 2020-04-14 (×4): qty 1.5

## 2020-04-14 MED ORDER — ONDANSETRON HCL 4 MG/2ML IJ SOLN: 4.0000 mg | Freq: Four times a day (QID) | INTRAMUSCULAR | Status: DC | PRN

## 2020-04-14 MED ORDER — POTASSIUM CHLORIDE 10 MEQ/50ML IV SOLN
10.0000 meq | INTRAVENOUS | Status: AC
  Administered 2020-04-14 (×3): 10 meq via INTRAVENOUS

## 2020-04-14 MED ORDER — BISACODYL 10 MG RE SUPP: 10.0000 mg | Freq: Every day | RECTAL | Status: DC

## 2020-04-14 MED ORDER — ORAL CARE MOUTH RINSE: 15.0000 mL | Freq: Once | OROMUCOSAL | Status: AC

## 2020-04-14 MED ORDER — OXYCODONE HCL 5 MG PO TABS
5.0000 mg | ORAL_TABLET | ORAL | Status: DC | PRN
  Administered 2020-04-14 – 2020-04-15 (×2): 10 mg via ORAL
  Administered 2020-04-15 (×2): 5 mg via ORAL
  Filled 2020-04-14 (×3): qty 2
  Filled 2020-04-14: qty 1

## 2020-04-14 MED ORDER — VANCOMYCIN HCL IN DEXTROSE 1-5 GM/200ML-% IV SOLN
1000.0000 mg | Freq: Once | INTRAVENOUS | Status: AC
  Administered 2020-04-14: 1000 mg via INTRAVENOUS
  Filled 2020-04-14: qty 200

## 2020-04-14 MED ORDER — DEXMEDETOMIDINE HCL IN NACL 400 MCG/100ML IV SOLN: 0.0000 ug/kg/h | INTRAVENOUS | Status: DC

## 2020-04-14 MED ORDER — CHLORHEXIDINE GLUCONATE 0.12 % MT SOLN: 15.0000 mL | Freq: Once | OROMUCOSAL | Status: DC

## 2020-04-14 MED ORDER — FENTANYL CITRATE (PF) 100 MCG/2ML IJ SOLN
INTRAMUSCULAR | Status: AC
  Filled 2020-04-14: qty 2

## 2020-04-14 SURGICAL SUPPLY — 155 items
ADAPTER CARDIO PERF ANTE/RETRO (ADAPTER) ×3 IMPLANT
APPLICATOR TIP COSEAL (VASCULAR PRODUCTS) ×3 IMPLANT
APPLICATOR TIP STD SYR BGAT-SY (MISCELLANEOUS) IMPLANT
ATTRACTOMAT 16X20 MAGNETIC DRP (DRAPES) ×3 IMPLANT
BAG DECANTER FOR FLEXI CONT (MISCELLANEOUS) ×6 IMPLANT
BLADE CLIPPER SURG (BLADE) ×3 IMPLANT
BLADE STERNUM SYSTEM 6 (BLADE) ×3 IMPLANT
BLADE SURG 11 STRL SS (BLADE) ×3 IMPLANT
BLADE SURG 15 STRL LF DISP TIS (BLADE) IMPLANT
BLADE SURG 15 STRL SS (BLADE)
CANISTER SUCT 3000ML PPV (MISCELLANEOUS) ×3 IMPLANT
CANN PRFSN .5XCNCT 15X34-48 (MISCELLANEOUS)
CANNULA AORTIC ROOT 9FR (CANNULA) ×3 IMPLANT
CANNULA EZ GLIDE AORTIC 21FR (CANNULA) IMPLANT
CANNULA FEM BIOMEDICUS 25FR (CANNULA) ×3 IMPLANT
CANNULA GUNDRY RCSP 15FR (MISCELLANEOUS) ×3 IMPLANT
CANNULA OPTISITE PERFUSION 20F (CANNULA) ×3 IMPLANT
CANNULA PRFSN .5XCNCT 15X34-48 (MISCELLANEOUS) IMPLANT
CANNULA VEN 2 STAGE (MISCELLANEOUS)
CANNULA VENNOUS METAL TIP 20FR (CANNULA) ×3 IMPLANT
CATH CPB KIT OWEN (MISCELLANEOUS) IMPLANT
CATH HEART VENT LEFT (CATHETERS) ×2 IMPLANT
CATH ROBINSON RED A/P 18FR (CATHETERS) ×6 IMPLANT
CATH THORACIC 28FR (CATHETERS) IMPLANT
CATH THORACIC 36FR (CATHETERS) ×3 IMPLANT
CATH THORACIC 36FR RT ANG (CATHETERS) IMPLANT
CAUTERY EYE LOW TEMP 1300F FIN (OPHTHALMIC RELATED) ×3 IMPLANT
CAUTERY SURG HI TEMP FINE TIP (MISCELLANEOUS) ×3 IMPLANT
CLIP VESOCCLUDE MED 6/CT (CLIP) IMPLANT
CLIP VESOCCLUDE SM WIDE 24/CT (CLIP) IMPLANT
CLIP VESOCCLUDE SM WIDE 6/CT (CLIP) IMPLANT
CNTNR URN SCR LID CUP LEK RST (MISCELLANEOUS) ×4 IMPLANT
CONN 1/2X1/2X1/2  BEN (MISCELLANEOUS)
CONN 1/2X1/2X1/2 BEN (MISCELLANEOUS) IMPLANT
CONN 3/8X3/8 GISH STERILE (MISCELLANEOUS) IMPLANT
CONN ST 1/4X3/8  BEN (MISCELLANEOUS)
CONN ST 1/4X3/8 BEN (MISCELLANEOUS) IMPLANT
CONN Y 3/8X3/8X3/8  BEN (MISCELLANEOUS)
CONN Y 3/8X3/8X3/8 BEN (MISCELLANEOUS) IMPLANT
CONNECTOR 1/2X3/8X1/2 3 WAY (MISCELLANEOUS) ×1
CONNECTOR 1/2X3/8X1/2 3WAY (MISCELLANEOUS) ×2 IMPLANT
CONT SPEC 4OZ STRL OR WHT (MISCELLANEOUS) ×2
COVER SURGICAL LIGHT HANDLE (MISCELLANEOUS) ×3 IMPLANT
DEFOGGER ANTIFOG KIT (MISCELLANEOUS) ×3 IMPLANT
DERMABOND ADVANCED (GAUZE/BANDAGES/DRESSINGS) ×1
DERMABOND ADVANCED .7 DNX12 (GAUZE/BANDAGES/DRESSINGS) ×2 IMPLANT
DEVICE CLOSURE PERCLS PRGLD 6F (VASCULAR PRODUCTS) ×4 IMPLANT
DRAIN CHANNEL 32F RND 10.7 FF (WOUND CARE) ×6 IMPLANT
DRAPE CARDIOVASCULAR INCISE (DRAPES)
DRAPE CV SPLIT W-CLR ANES SCRN (DRAPES) IMPLANT
DRAPE INCISE IOBAN 66X45 STRL (DRAPES) ×6 IMPLANT
DRAPE PERI GROIN 82X75IN TIB (DRAPES) IMPLANT
DRAPE SLUSH/WARMER DISC (DRAPES) ×3 IMPLANT
DRAPE SRG 135X102X78XABS (DRAPES) IMPLANT
DRSG AQUACEL AG ADV 3.5X14 (GAUZE/BANDAGES/DRESSINGS) ×3 IMPLANT
ELECT REM PT RETURN 9FT ADLT (ELECTROSURGICAL) ×6
ELECTRODE REM PT RTRN 9FT ADLT (ELECTROSURGICAL) ×4 IMPLANT
FELT TEFLON 1X6 (MISCELLANEOUS) ×6 IMPLANT
FELT TEFLON 6X6 (MISCELLANEOUS) IMPLANT
FIBERTAPE STERNAL CLSR 2 36IN (SUTURE) ×12 IMPLANT
FIBERTAPE STERNAL CLSR 2X36 (SUTURE) ×12 IMPLANT
GAUZE SPONGE 4X4 12PLY STRL (GAUZE/BANDAGES/DRESSINGS) ×6 IMPLANT
GAUZE SPONGE 4X4 12PLY STRL LF (GAUZE/BANDAGES/DRESSINGS) ×3 IMPLANT
GLOVE BIO SURGEON STRL SZ 6 (GLOVE) ×3 IMPLANT
GLOVE BIO SURGEON STRL SZ 6.5 (GLOVE) ×18 IMPLANT
GLOVE BIO SURGEON STRL SZ7 (GLOVE) IMPLANT
GLOVE BIO SURGEON STRL SZ7.5 (GLOVE) IMPLANT
GLOVE ORTHO TXT STRL SZ7.5 (GLOVE) ×9 IMPLANT
GOWN STRL REUS W/ TWL LRG LVL3 (GOWN DISPOSABLE) ×16 IMPLANT
GOWN STRL REUS W/TWL LRG LVL3 (GOWN DISPOSABLE) ×8
HEMOSTAT POWDER SURGIFOAM 1G (HEMOSTASIS) ×9 IMPLANT
INSERT FOGARTY SM (MISCELLANEOUS) IMPLANT
INSERT FOGARTY XLG (MISCELLANEOUS) ×3 IMPLANT
KIT BASIN OR (CUSTOM PROCEDURE TRAY) ×3 IMPLANT
KIT DILATOR VASC 18G NDL (KITS) ×3 IMPLANT
KIT DRAINAGE VACCUM ASSIST (KITS) ×3 IMPLANT
KIT SUCTION CATH 14FR (SUCTIONS) ×15 IMPLANT
KIT TURNOVER KIT B (KITS) ×3 IMPLANT
LEAD PACING MYOCARDI (MISCELLANEOUS) ×3 IMPLANT
LINE VENT (MISCELLANEOUS) ×3 IMPLANT
LOOP VESSEL SUPERMAXI WHITE (MISCELLANEOUS) ×3 IMPLANT
NDL SUT 6 .5 CRC .975X.05 MAYO (NEEDLE) ×2 IMPLANT
NDL SUT PASSING CERCLAGE MED (SUTURE) ×3
NEEDLE AORTIC AIR ASPIRATING (NEEDLE) IMPLANT
NEEDLE MAYO TAPER (NEEDLE) ×1
NEEDLE SUT PASSING CERCLAG MED (SUTURE) ×2 IMPLANT
NS IRRIG 1000ML POUR BTL (IV SOLUTION) ×15 IMPLANT
PACK E OPEN HEART (SUTURE) ×3 IMPLANT
PACK OPEN HEART (CUSTOM PROCEDURE TRAY) ×3 IMPLANT
PAD ARMBOARD 7.5X6 YLW CONV (MISCELLANEOUS) ×6 IMPLANT
PERCLOSE PROGLIDE 6F (VASCULAR PRODUCTS) ×6
POSITIONER HEAD DONUT 9IN (MISCELLANEOUS) ×3 IMPLANT
SEALANT ADHERUS EXTEND TIP (MISCELLANEOUS) ×3 IMPLANT
SET CARDIOPLEGIA MPS 5001102 (MISCELLANEOUS) ×3 IMPLANT
SET IRRIG TUBING LAPAROSCOPIC (IRRIGATION / IRRIGATOR) ×3 IMPLANT
SET MICROPUNCTURE 5F STIFF (MISCELLANEOUS) ×3 IMPLANT
SPONGE LAP 18X18 RF (DISPOSABLE) IMPLANT
SPONGE LAP 4X18 RFD (DISPOSABLE) IMPLANT
STOPCOCK 4 WAY LG BORE MALE ST (IV SETS) IMPLANT
SUT BONE WAX W31G (SUTURE) ×3 IMPLANT
SUT ETHIBON 2 0 V 52N 30 (SUTURE) ×9 IMPLANT
SUT ETHIBON EXCEL 2-0 V-5 (SUTURE) IMPLANT
SUT ETHIBOND 2 0 SH (SUTURE)
SUT ETHIBOND 2 0 SH 36X2 (SUTURE) IMPLANT
SUT ETHIBOND 2 0 V4 (SUTURE) IMPLANT
SUT ETHIBOND 2 0V4 GREEN (SUTURE) IMPLANT
SUT ETHIBOND 4 0 RB 1 (SUTURE) IMPLANT
SUT ETHIBOND V-5 VALVE (SUTURE) IMPLANT
SUT ETHIBOND X763 2 0 SH 1 (SUTURE) ×9 IMPLANT
SUT MNCRL AB 3-0 PS2 18 (SUTURE) ×12 IMPLANT
SUT PDS AB 1 CTX 36 (SUTURE) ×6 IMPLANT
SUT PROLENE 3 0 RB 1 (SUTURE) IMPLANT
SUT PROLENE 3 0 SH DA (SUTURE) ×6 IMPLANT
SUT PROLENE 3 0 SH1 36 (SUTURE) ×12 IMPLANT
SUT PROLENE 4 0 RB 1 (SUTURE) ×7
SUT PROLENE 4 0 SH DA (SUTURE) ×6 IMPLANT
SUT PROLENE 4-0 RB1 .5 CRCL 36 (SUTURE) ×14 IMPLANT
SUT PROLENE 5 0 C 1 36 (SUTURE) ×6 IMPLANT
SUT PROLENE 6 0 C 1 30 (SUTURE) IMPLANT
SUT SILK  1 MH (SUTURE) ×2
SUT SILK 1 MH (SUTURE) ×4 IMPLANT
SUT SILK 2 0 SH CR/8 (SUTURE) IMPLANT
SUT SILK 3 0 SH CR/8 (SUTURE) IMPLANT
SUT STEEL 6MS V (SUTURE) IMPLANT
SUT STEEL STERNAL CCS#1 18IN (SUTURE) IMPLANT
SUT STEEL SZ 6 DBL 3X14 BALL (SUTURE) IMPLANT
SUT VIC AB 1 CT1 18XCR BRD 8 (SUTURE) IMPLANT
SUT VIC AB 1 CT1 8-18 (SUTURE)
SUT VIC AB 1 CTX 27 (SUTURE) IMPLANT
SUT VIC AB 2-0 CT1 27 (SUTURE)
SUT VIC AB 2-0 CT1 TAPERPNT 27 (SUTURE) IMPLANT
SUT VIC AB 2-0 CTX 27 (SUTURE) ×3 IMPLANT
SUT VIC AB 2-0 CTX 36 (SUTURE) ×6 IMPLANT
SUT VIC AB 3-0 SH 27 (SUTURE)
SUT VIC AB 3-0 SH 27X BRD (SUTURE) IMPLANT
SUT VIC AB 3-0 X1 27 (SUTURE) IMPLANT
SUT VICRYL 4-0 PS2 18IN ABS (SUTURE) IMPLANT
SYR 10ML KIT SKIN ADHESIVE (MISCELLANEOUS) ×3 IMPLANT
SYR BULB IRRIG 60ML STRL (SYRINGE) ×3 IMPLANT
SYSTEM SAHARA CHEST DRAIN ATS (WOUND CARE) ×3 IMPLANT
TAPE CLOTH SURG 4X10 WHT LF (GAUZE/BANDAGES/DRESSINGS) ×3 IMPLANT
TAPE PAPER 2X10 WHT MICROPORE (GAUZE/BANDAGES/DRESSINGS) ×3 IMPLANT
TOWEL GREEN STERILE (TOWEL DISPOSABLE) ×3 IMPLANT
TOWEL GREEN STERILE FF (TOWEL DISPOSABLE) ×3 IMPLANT
TRAY CATH LUMEN 1 20CM STRL (SET/KITS/TRAYS/PACK) IMPLANT
TRAY FOLEY SLVR 14FR TEMP STAT (SET/KITS/TRAYS/PACK) ×3 IMPLANT
TRAY FOLEY SLVR 16FR TEMP STAT (SET/KITS/TRAYS/PACK) ×3 IMPLANT
TUBE CONNECTING 12X1/4 (SUCTIONS) ×3 IMPLANT
TUBE SUCT INTRACARD DLP 20F (MISCELLANEOUS) ×3 IMPLANT
UNDERPAD 30X36 HEAVY ABSORB (UNDERPADS AND DIAPERS) ×3 IMPLANT
VALVE AORTIC KONECT RESILIA 25 (Valve) ×3 IMPLANT
VENT LEFT HEART 12002 (CATHETERS) ×3
WATER STERILE IRR 1000ML POUR (IV SOLUTION) ×6 IMPLANT
WIRE EMERALD 3MM-J .035X150CM (WIRE) ×3 IMPLANT
YANKAUER SUCT BULB TIP NO VENT (SUCTIONS) ×3 IMPLANT

## 2020-04-14 NOTE — Anesthesia Procedure Notes (Signed)
Procedure Name: Intubation Date/Time: 04/14/2020 8:06 AM Performed by: Novalyn Lajara T, CRNA Pre-anesthesia Checklist: Patient identified, Emergency Drugs available, Suction available and Patient being monitored Patient Re-evaluated:Patient Re-evaluated prior to induction Oxygen Delivery Method: Circle system utilized Preoxygenation: Pre-oxygenation with 100% oxygen Induction Type: IV induction Ventilation: Mask ventilation without difficulty and Oral airway inserted - appropriate to patient size Laryngoscope Size: Mac and 4 Grade View: Grade I Tube type: Oral Tube size: 7.5 mm Number of attempts: 1 Airway Equipment and Method: Stylet and Oral airway Placement Confirmation: ETT inserted through vocal cords under direct vision,  positive ETCO2 and breath sounds checked- equal and bilateral Secured at: 22 cm Tube secured with: Tape Dental Injury: Teeth and Oropharynx as per pre-operative assessment

## 2020-04-14 NOTE — Op Note (Signed)
CARDIOTHORACIC SURGERY OPERATIVE NOTE  Date of Procedure:  04/14/2020  Preoperative Diagnosis: Severe Aortic Stenosis   Postoperative Diagnosis: Same   Procedure:    Biological Bentall Aortic Root Replacement  Edwards Konect Resilia stented bovine pericardial tissue valved conduit (size 25 mm, model # 11060A, serial # [id number])   Reimplantation of left main and right coronary artery  Replacement of ascending thoracic aortic aneurysm    Surgeon: Valentina Gu. Roxy Manns, MD  Assistant: John Giovanni, PA-C  Anesthesia: Suella Broad, MD  Operative Findings:  [place] type I bicuspid aortic valve with severe aortic stenosis  Fusiform aneurysmal dilitation of the aortic root and entire ascending thoracic aorta  Normal left ventricular systolic function              BRIEF CLINICAL NOTE AND INDICATIONS FOR SURGERY  Patient is 74 year old male with longstanding history of heart murmur and tobacco abuse who has been referred for surgical consultation to discuss treatment options for management of recently discovered severe symptomatic aortic stenosis with aneurysm involving the ascending thoracic aorta.  Patient states that he was first told that he had a heart murmur as a teenager but he has never had a formal cardiac evaluation until recently.  He has a longstanding history of tobacco abuse and underwent low-dose screening CT scan without contrast to rule out the possibility of lung cancer.  CT scan revealed findings consistent with centrilobular and paraseptal emphysema as well as an 8.2 mm opacity at the apex of the right lung that was possibly consistent with scarring but worthy of follow-up.  CT scan also revealed dilated ascending thoracic aorta which measured 5.1 cm in its greatest diameter.  The patient was referred for cardiac evaluation and seen by Dr. Einar Gip who noted a prominent systolic murmur on physical exam.  Transthoracic echocardiogram performed in his office revealed  normal left ventricular systolic function with severe aortic stenosis.  Peak velocity across aortic valve was measured 4.1 m/s corresponding to mean transvalvular gradient estimated 44 mmHg.  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  Diagnostic cardiac catheterization performed February 17, 2020 revealed normal coronary artery anatomy with no significant coronary artery disease.  Right heart pressures and hemodynamics were normal.  Cardiothoracic surgical consultation was requested.  The patient has been seen in consultation and counseled at length regarding the indications, risks and potential benefits of surgery.  All questions have been answered, and the patient provides full informed consent for the operation as described.    DETAILS OF THE OPERATIVE PROCEDURE  Preparation:  The patient is brought to the operating room on the above mentioned date and central monitoring was established by the anesthesia team including placement of Swan-Ganz catheter and radial arterial line. The patient is placed in the supine position on the operating table.  Intravenous antibiotics are administered. General endotracheal anesthesia is induced uneventfully. A Foley catheter is placed.  Baseline transesophageal echocardiogram was performed.  Findings were notable for severe aortic stenosis.  The aortic valve appeared functionally bicuspid.  There was severe calcification with restricted leaflet mobility.  The aorta was dilated.  There was normal left ventricular systolic function with significant left ventricular hypertrophy.  The patient's chest, abdomen, both groins, and both lower extremities are prepared and draped in a sterile manner. A time out procedure is performed.   Surgical Approach:  A median sternotomy incision was performed and the pericardium is opened. The entire ascending aorta  is dilated with fusiform enlargement  extending from the aortic root to the takeoff of the innominate artery.  The distal ascending aorta is dissected away from the innominate vein and the anterior surface of the innominate artery is exposed.   Percutaneous Vascular Access:  Percutaneous venous access were obtained on the right side.  Using ultrasound guidance the right common femoral vein was cannulated using the Seldinger technique and a pair of Perclose vascular closure devises were placed at opposing 30 degree angles, after which time an 8 French sheath inserted.    Extracorporeal Cardiopulmonary Bypass and Myocardial Protection:  The right common femoral vein is cannulated through the venous sheath and a guidewire advanced into the right atrium using TEE guidance.  The femoral vein cannulated using a 25 Fr long femoral venous cannula.  The innominate artery is cannulated using Seldinger technique and a guidewire advanced into the descending thoracic aorta using TEE guidance.  The innominate artery is cannulated with a 20 French femoral arterial cannula.  Adequate heparinization is verified.   The entire pre-bypass portion of the operation was notable for stable hemodynamics.  Cardiopulmonary bypass was begun and the surface of the heart is inspected.  Adhesions surrounding the ascending aorta and left ventricle are divided with electrocautery.  Attempts to place a retrograde cardioplegia cannula through the right atrium into the coronary sinus are initially unsuccessful.  Subsequently a 22 French right angled metal tip venous cannula was placed directly in the superior vena cava.  Umbilical tapes were placed around the superior vena cava and the inferior vena cava.  The femoral venous cannula is pulled down until the tip is outside of the right atrium and cable tape secured.  An oblique incision is made in the right atrium.  Attempts to place the retrograde cardioplegia cannula into the coronary sinus under direct vision are  unsuccessful due to webbing and very small size of the coronary sinus ostium.  A left ventricular vent is placed through the right superior pulmonary vein.  A cardioplegia cannula is placed in the ascending aorta.  A temperature probe was placed in the interventricular septum.  The operative field was continuously flooded with carbon dioxide gas.  The patient is cooled to 32C systemic temperature.  The aortic cross clamp is applied immediately below the takeoff of the innominate artery and cardioplegia is delivered in an antegrade fashion through the aortic root using modified [place] cold blood cardioplegia ([place] blood cardioplegia protocol).   The initial cardioplegic arrest is rapid with early diastolic arrest.  A repeat dose of cardioplegia is administered at 70 minutes using hand-held cannula directly into the left main coronary artery.  Myocardial protection was felt to be excellent.   Aortic Root Replacement with Repair of Ascending Thoracic Aortic Aneurysm:  The ascending aortic aneurysm is transected at the approximate level of the sinotubular junction.  The remainder of of the ascending thoracic aortic aneurysm is resected to a level immediately below the aortic cross-clamp.  Immediately below the cross-clamp the transverse diameter of the aorta is approximately 32 mm.  The transverse diameter of the sinotubular junction is dilated to close to 40 mm.  The aortic valve was inspected and notable for Sievers type I bicuspid aortic valve with severe aortic stenosis.  There is a single raphae between the right and noncoronary leaflets.  The aortic valve leaflets were excised sharply and the aortic annulus decalcified.  Decalcification was notably tedious due to extensive calcification.  The aortic annulus was sized to  accept a 25 mm prosthesis.  The aortic root and left ventricle were irrigated with copious cold saline solution.  The left main and the right coronary artery were each  mobilized on separate buttons of aortic tissue.  The remainder of the sinuses of Valsalva are excised sharply.  The aortic annulus is resized and felt appropriate for a 25 mm valved conduit.  Aortic root replacement was performed using interrupted horizontal mattress 2-0 Ethibond pledgeted sutures with pledgets in the subannular position for the proximal suture line.  An Edwards Konect Resilia stented bovine pericardial tissue valved conduit (size 25 mm, model # 11060A, serial # [id number]) was implanted uneventfully.   The left main and the right coronary arteries were each reimplanted into the Valsalva portion of the aortic root graft using running 5-0 Prolene suture after fashioning elliptical shaped holes in the graft with thermal cautery.  The distal end of the aortic graft was trimmed and beveled to an appropriate length to replace the entire ascending thoracic aorta.  The distal suture line was performed directly to the distal aorta immediately below the aortic cross-clamp with the cross-clamp in place using running 3-0 Prolene suture with Teflon felt strips to buttress the suture line.   Procedure Completion:  An antegrade cardioplegia cannula was replaced into the anterior surface of the ascending aortic graft.  One final dose of warm "reanimation dose" cardioplegia was administered through the aortic root.  The aortic cross clamp was removed after a total cross clamp time of 120 minutes.  The entire aortic graft is inspected for hemostasis.  Epicardial pacing wires are fixed to the right ventricular outflow tract and to the right atrial appendage. The patient is rewarmed to 37C temperature.  The right atriotomy incision was closed.  The aortic and left ventricular vents are removed.  The superior vena cava cannula was removed.  The patient is weaned and disconnected from cardiopulmonary bypass.  The patient's rhythm at separation from bypass was AV paced.  The patient was weaned from  cardiopulmonary bypass without any inotropic support. Total cardiopulmonary bypass time for the operation was 174 minutes.  Followup transesophageal echocardiogram performed after separation from bypass revealed a well-seated aortic valve prosthesis that was functioning normally and without any aortic insufficiency.  Left ventricular function was unchanged from preoperatively.  The femoral venous cannula was removed uneventfully and Perclose sutures secured. Protamine was administered to reverse the anticoagulation.  Manual pressure was held on the right groin.  The innominate artery cannula was removed.  The mediastinum was inspected for hemostasis and irrigated with saline solution. The mediastinum was drained using 2 chest tubes placed through separate stab incisions inferiorly.  The soft tissues anterior to the aorta were reapproximated loosely. The sternum is closed with Fibertape cerclage. The soft tissues anterior to the sternum were closed in multiple layers and the skin is closed with a running subcuticular skin closure.  The post-bypass portion of the operation was notable for stable rhythm and hemodynamics.  No blood products were administered during the operation.   Disposition:  The patient tolerated the procedure well and is transported to the surgical intensive care in stable condition. There are no intraoperative complications. All sponge instrument and needle counts are verified correct at completion of the operation.    Valentina Gu. Roxy Manns MD 04/14/2020 2:05 PM

## 2020-04-14 NOTE — Procedures (Signed)
Extubation Procedure Note  Patient Details:   Name: Kurt Allen DOB: 10-09-1945 MRN: [id number]   Airway Documentation:    Vent end date: 04/14/20 Vent end time: 1835   Evaluation  O2 sats: stable throughout Complications: No apparent complications Patient did tolerate procedure well. Bilateral Breath Sounds: Diminished, Coarse crackles   Yes  4l/min  NIF-20 FVC-800 Incentive spirometer instructed + vocalization  Revonda Standard 04/14/2020, 6:39 PM

## 2020-04-14 NOTE — Anesthesia Procedure Notes (Signed)
Central Venous Catheter Insertion Performed by: Roderic Palau, MD, anesthesiologist Start/End8/25/2021 6:40 AM, 04/14/2020 6:55 AM Patient location: Pre-op. Preanesthetic checklist: patient identified, IV checked, site marked, risks and benefits discussed, surgical consent, monitors and equipment checked, pre-op evaluation, timeout performed and anesthesia consent Hand hygiene performed  and maximum sterile barriers used  PA cath was placed.Swan type:thermodilution PA Cath depth:55 Procedure performed without using ultrasound guided technique. Attempts: 1 Patient tolerated the procedure well with no immediate complications.

## 2020-04-14 NOTE — Brief Op Note (Signed)
04/14/2020  12:46 PM  PATIENT:  Kurt Allen  74 y.o. male  PRE-OPERATIVE DIAGNOSIS:  Aortic Stenosis ASCENDING AORTIC ANEURYSM  POST-OPERATIVE DIAGNOSIS:  Aortic Stenosis ASCENDING AORTIC ANEURYSM  PROCEDURE:  Procedure(s): AORTIC VALVE REPLACEMENT (AVR) (N/A) THORACIC ASCENDING ANEURYSM REPAIR (AAA) (N/A) BENTALL AORTIC ROOT REPLACEMENT USING KONECT RESILIA 25MM AORTIC VALVED CONDUIT WITH REIMPLANTATION OF RIGHT AND LEFT CORONARY BUTTONS (N/A) TRANSESOPHAGEAL ECHOCARDIOGRAM (TEE) (N/A)  SURGEON:  Surgeon(s) and Role:    Rexene Alberts, MD - Primary  PHYSICIAN ASSISTANT: WAYNE GOLD PA-C  ASSISTANTS: STAFF   ANESTHESIA:   general  EBL:  557 mL   BLOOD ADMINISTERED:none  DRAINS: MEDIASTINAL CHEST TUBES   LOCAL MEDICATIONS USED:  NONE  SPECIMEN:  Source of Specimen:  AORTIC ANEURYSM AND AORTIC VALVE LEAFLETS  DISPOSITION OF SPECIMEN:  PATHOLOGY  COUNTS:  YES  TOURNIQUET:  * No tourniquets in log *  DICTATION: .Dragon Dictation  PLAN OF CARE: Admit to inpatient   PATIENT DISPOSITION:  ICU - intubated and hemodynamically stable.   Delay start of Pharmacological VTE agent (>24hrs) due to surgical blood loss or risk of bleeding: yes  COMPLICATIONS: NO KNOWN

## 2020-04-14 NOTE — Progress Notes (Signed)
TCTS evening Rounds  DOS s/p bio-Bentall HD stable Not bleeding Good UOP  BP (!) 124/92    Pulse 80    Temp 98.4 F (36.9 C)    Resp 13    Ht 5\' 8"  (1.727 m)    Wt 69 kg    SpO2 100%    BMI 23.13 kg/m   Intake/Output Summary (Last 24 hours) at 04/14/2020 1732 Last data filed at 04/14/2020 1700 Gross per 24 hour  Intake 5594.15 ml  Output 4737 ml  Net 857.15 ml    PE: waking slowly  RRR CTA  A/p: wean to extubate Support hemodynamics  Neilani Duffee Z. Orvan Seen, [place]

## 2020-04-14 NOTE — Anesthesia Procedure Notes (Signed)
Arterial Line Insertion Start/End8/25/2021 7:25 AM, 04/14/2020 7:30 AM Performed by: Effie Berkshire, MD, anesthesiologist  Patient location: Pre-op. Preanesthetic checklist: patient identified, IV checked, site marked, risks and benefits discussed, surgical consent, monitors and equipment checked, pre-op evaluation, timeout performed and anesthesia consent Lidocaine 1% used for infiltration Left, radial was placed Catheter size: 20 Fr Hand hygiene performed  and maximum sterile barriers used   Attempts: 1 Procedure performed without using ultrasound guided technique. Following insertion, dressing applied. Post procedure assessment: normal and unchanged  Patient tolerated the procedure well with no immediate complications.

## 2020-04-14 NOTE — Anesthesia Postprocedure Evaluation (Signed)
Anesthesia Post Note  Patient: Inman Fettig Zappia  Procedure(s) Performed: THORACIC ASCENDING ANEURYSM REPAIR (AAA) (N/A ) BENTALL AORTIC ROOT REPLACEMENT USING KONECT RESILIA 25MM AORTIC VALVED CONDUIT WITH REIMPLANTATION OF RIGHT AND LEFT CORONARY BUTTONS (N/A Chest) TRANSESOPHAGEAL ECHOCARDIOGRAM (TEE) (N/A ) AORTIC VALVE REPLACEMENT (AVR) (N/A Chest)     Patient location during evaluation: SICU Anesthesia Type: General Level of consciousness: sedated Pain management: pain level controlled Vital Signs Assessment: post-procedure vital signs reviewed and stable Respiratory status: patient remains intubated per anesthesia plan Cardiovascular status: stable Postop Assessment: no apparent nausea or vomiting Anesthetic complications: no   No complications documented.  Last Vitals:  Vitals:   04/14/20 1600 04/14/20 1700  BP:  (!) 124/92  Pulse: 80 80  Resp: 12 13  Temp: (!) 35.6 C 36.9 C  SpO2: 98% 100%    Last Pain:  Vitals:   04/14/20 0600  PainSc: 0-No pain                 Effie Berkshire

## 2020-04-14 NOTE — Interval H&P Note (Signed)
History and Physical Interval Note:  04/14/2020 6:23 AM  Kurt Allen  has presented today for surgery, with the diagnosis of AS ASCENDING AORTIC ANEURYSM.  The various methods of treatment have been discussed with the patient and family. After consideration of risks, benefits and other options for treatment, the patient has consented to  Procedure(s): AORTIC VALVE REPLACEMENT (AVR) (N/A) THORACIC ASCENDING ANEURYSM REPAIR (AAA) (N/A) possible BENTALL AORTIC ROOT REPLACEMENT (N/A) TRANSESOPHAGEAL ECHOCARDIOGRAM (TEE) (N/A) as a surgical intervention.  The patient's history has been reviewed, patient examined, no change in status, stable for surgery.  I have reviewed the patient's chart and labs.  Questions were answered to the patient's satisfaction.     Rexene Alberts

## 2020-04-14 NOTE — Plan of Care (Signed)
Pt extubated during day shift, dangled once this PM. Progressing well.   Problem: Education: Goal: Knowledge of General Education information will improve Description: Including pain rating scale, medication(s)/side effects and non-pharmacologic comfort measures Outcome: Progressing   Problem: Health Behavior/Discharge Planning: Goal: Ability to manage health-related needs will improve Outcome: Progressing   Problem: Clinical Measurements: Goal: Ability to maintain clinical measurements within normal limits will improve Outcome: Progressing Goal: Will remain free from infection Outcome: Progressing Goal: Diagnostic test results will improve Outcome: Progressing Goal: Respiratory complications will improve Outcome: Progressing Goal: Cardiovascular complication will be avoided Outcome: Progressing   Problem: Activity: Goal: Risk for activity intolerance will decrease Outcome: Progressing   Problem: Nutrition: Goal: Adequate nutrition will be maintained Outcome: Progressing   Problem: Coping: Goal: Level of anxiety will decrease Outcome: Progressing   Problem: Elimination: Goal: Will not experience complications related to bowel motility Outcome: Progressing Goal: Will not experience complications related to urinary retention Outcome: Progressing   Problem: Pain Managment: Goal: General experience of comfort will improve Outcome: Progressing   Problem: Safety: Goal: Ability to remain free from injury will improve Outcome: Progressing   Problem: Skin Integrity: Goal: Risk for impaired skin integrity will decrease Outcome: Progressing

## 2020-04-14 NOTE — Anesthesia Procedure Notes (Signed)
Central Venous Catheter Insertion Performed by: Roderic Palau, MD, anesthesiologist Start/End8/25/2021 6:40 AM, 04/14/2020 6:55 AM Patient location: Pre-op. Preanesthetic checklist: patient identified, IV checked, site marked, risks and benefits discussed, surgical consent, monitors and equipment checked, pre-op evaluation, timeout performed and anesthesia consent Position: Trendelenburg Lidocaine 1% used for infiltration and patient sedated Hand hygiene performed , maximum sterile barriers used  and Seldinger technique used Catheter size: 9 Fr Total catheter length 10. Central line was placed.MAC introducer Procedure performed using ultrasound guided technique. Ultrasound Notes:anatomy identified, needle tip was noted to be adjacent to the nerve/plexus identified, no ultrasound evidence of intravascular and/or intraneural injection and image(s) printed for medical record Attempts: 2 (Attempted RIJ. Unable to pass wire.) Following insertion, line sutured, dressing applied and Biopatch. Post procedure assessment: blood return through all ports, free fluid flow and no air  Patient tolerated the procedure well with no immediate complications.

## 2020-04-14 NOTE — Anesthesia Preprocedure Evaluation (Addendum)
Anesthesia Evaluation  Patient identified by MRN, date of birth, ID band Patient awake    Reviewed: Allergy & Precautions, NPO status , Patient's Chart, lab work & pertinent test results  Airway Mallampati: I  TM Distance: <3 FB Neck ROM: Full    Dental  (+) Edentulous Upper, Edentulous Lower   Pulmonary COPD,  COPD inhaler, Current Smoker and Patient abstained from smoking.,     + decreased breath sounds      Cardiovascular hypertension, Pt. on home beta blockers  Rhythm:Regular Rate:Normal + Systolic murmurs    Neuro/Psych negative neurological ROS  negative psych ROS   GI/Hepatic Neg liver ROS,   Endo/Other  negative endocrine ROS  Renal/GU      Musculoskeletal   Abdominal Normal abdominal exam  (+)   Peds  Hematology negative hematology ROS (+)   Anesthesia Other Findings   Reproductive/Obstetrics                            Anesthesia Physical Anesthesia Plan  ASA: IV  Anesthesia Plan: General   Post-op Pain Management:    Induction: Intravenous  PONV Risk Score and Plan: 2 and Ondansetron  Airway Management Planned: Oral ETT  Additional Equipment: Arterial line, CVP, PA Cath, TEE and Ultrasound Guidance Line Placement  Intra-op Plan:   Post-operative Plan: Post-operative intubation/ventilation  Informed Consent: I have reviewed the patients History and Physical, chart, labs and discussed the procedure including the risks, benefits and alternatives for the proposed anesthesia with the patient or authorized representative who has indicated his/her understanding and acceptance.       Plan Discussed with: CRNA  Anesthesia Plan Comments: (Lab Results      Component                Value               Date                      WBC                      4.6                 04/12/2020                HGB                      14.7                04/12/2020                HCT                       43.4                04/12/2020                MCV                      91.0                04/12/2020                PLT                      170  04/12/2020            Echo:  Left ventricle cavity is normal in size. Moderate concentric hypertrophy  of the left ventricle. Normal global wall motion. Normal LV systolic  function with visual EF 50-55%. Indeterminate diastolic filling pattern.  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 Severe  annular and leaflet calcification.  Severe aortic stenosis. Aortic valve mean gradient of 44 mmHg, Vmax of 4.1  m/s. Calculated aortic valve area by continuity equation is 0.5 cm.  Mild (Grade I) aortic regurgitation.  Mild (Grade I) mitral regurgitation.  Inadequate TR jet to estimate pulmonary artery systolic pressure. Normal  right atrial pressure.)       Anesthesia Quick Evaluation

## 2020-04-14 NOTE — Transfer of Care (Signed)
Immediate Anesthesia Transfer of Care Note  Patient: Kurt Allen  Procedure(s) Performed: THORACIC ASCENDING ANEURYSM REPAIR (AAA) (N/A ) BENTALL AORTIC ROOT REPLACEMENT USING KONECT RESILIA 25MM AORTIC VALVED CONDUIT WITH REIMPLANTATION OF RIGHT AND LEFT CORONARY BUTTONS (N/A Chest) TRANSESOPHAGEAL ECHOCARDIOGRAM (TEE) (N/A ) AORTIC VALVE REPLACEMENT (AVR) (N/A Chest)  Patient Location: ICU  Anesthesia Type:General  Level of Consciousness: Patient remains intubated per anesthesia plan  Airway & Oxygen Therapy: Patient remains intubated per anesthesia plan and Patient placed on Ventilator (see vital sign flow sheet for setting)  Post-op Assessment: Report given to RN and Post -op Vital signs reviewed and stable  Post vital signs: Reviewed and stable  Last Vitals:  Vitals Value Taken Time  BP 126/62 04/14/20 1432  Temp 35.5 C 04/14/20 1453  Pulse 80 04/14/20 1453  Resp 12 04/14/20 1453  SpO2 98 % 04/14/20 1453  Vitals shown include unvalidated device data.  Last Pain:  Vitals:   04/14/20 0600  PainSc: 0-No pain      Patients Stated Pain Goal: 3 (62/86/38 1771)  Complications: No complications documented.

## 2020-04-15 ENCOUNTER — Inpatient Hospital Stay (HOSPITAL_COMMUNITY): Payer: Medicare HMO

## 2020-04-15 LAB — POCT I-STAT, CHEM 8
BUN: 8 mg/dL (ref 8–23)
Calcium, Ion: 0.94 mmol/L — ABNORMAL LOW (ref 1.15–1.40)
Chloride: 99 mmol/L (ref 98–111)
Creatinine, Ser: 0.6 mg/dL — ABNORMAL LOW (ref 0.61–1.24)
Glucose, Bld: 125 mg/dL — ABNORMAL HIGH (ref 70–99)
HCT: 23 % — ABNORMAL LOW (ref 39.0–52.0)
Hemoglobin: 7.8 g/dL — ABNORMAL LOW (ref 13.0–17.0)
Potassium: 7 mmol/L (ref 3.5–5.1)
Sodium: 132 mmol/L — ABNORMAL LOW (ref 135–145)
TCO2: 25 mmol/L (ref 22–32)

## 2020-04-15 LAB — GLUCOSE, CAPILLARY
Glucose-Capillary: 106 mg/dL — ABNORMAL HIGH (ref 70–99)
Glucose-Capillary: 114 mg/dL — ABNORMAL HIGH (ref 70–99)
Glucose-Capillary: 115 mg/dL — ABNORMAL HIGH (ref 70–99)
Glucose-Capillary: 122 mg/dL — ABNORMAL HIGH (ref 70–99)
Glucose-Capillary: 125 mg/dL — ABNORMAL HIGH (ref 70–99)
Glucose-Capillary: 126 mg/dL — ABNORMAL HIGH (ref 70–99)
Glucose-Capillary: 132 mg/dL — ABNORMAL HIGH (ref 70–99)
Glucose-Capillary: 132 mg/dL — ABNORMAL HIGH (ref 70–99)
Glucose-Capillary: 95 mg/dL (ref 70–99)

## 2020-04-15 LAB — BASIC METABOLIC PANEL
Anion gap: 8 (ref 5–15)
Anion gap: 9 (ref 5–15)
BUN: 8 mg/dL (ref 8–23)
BUN: 11 mg/dL (ref 8–23)
CO2: 21 mmol/L — ABNORMAL LOW (ref 22–32)
CO2: 24 mmol/L (ref 22–32)
Calcium: 7.8 mg/dL — ABNORMAL LOW (ref 8.9–10.3)
Calcium: 8.5 mg/dL — ABNORMAL LOW (ref 8.9–10.3)
Chloride: 105 mmol/L (ref 98–111)
Chloride: 100 mmol/L (ref 98–111)
Creatinine, Ser: 0.97 mg/dL (ref 0.61–1.24)
Creatinine, Ser: 1.16 mg/dL (ref 0.61–1.24)
GFR calc Af Amer: >60 mL/min (ref >60)
GFR calc Af Amer: >60 mL/min (ref >60)
GFR calc non Af Amer: >60 mL/min (ref >60)
GFR calc non Af Amer: >60 mL/min (ref >60)
Glucose, Bld: 128 mg/dL — ABNORMAL HIGH (ref 70–99)
Glucose, Bld: 156 mg/dL — ABNORMAL HIGH (ref 70–99)
Potassium: 4.5 mmol/L (ref 3.5–5.1)
Potassium: 4.5 mmol/L (ref 3.5–5.1)
Sodium: 134 mmol/L — ABNORMAL LOW (ref 135–145)
Sodium: 133 mmol/L — ABNORMAL LOW (ref 135–145)

## 2020-04-15 LAB — CBC
HCT: 34.6 % — ABNORMAL LOW (ref 39.0–52.0)
HCT: 38.7 % — ABNORMAL LOW (ref 39.0–52.0)
Hemoglobin: 11.7 g/dL — ABNORMAL LOW (ref 13.0–17.0)
Hemoglobin: 12.9 g/dL — ABNORMAL LOW (ref 13.0–17.0)
MCH: 31.8 pg (ref 26.0–34.0)
MCH: 31.5 pg (ref 26.0–34.0)
MCHC: 33.8 g/dL (ref 30.0–36.0)
MCHC: 33.3 g/dL (ref 30.0–36.0)
MCV: 94 fL (ref 80.0–100.0)
MCV: 94.6 fL (ref 80.0–100.0)
Platelets: 117 10*3/uL — ABNORMAL LOW (ref 150–400)
Platelets: 122 10*3/uL — ABNORMAL LOW (ref 150–400)
RBC: 3.68 MIL/uL — ABNORMAL LOW (ref 4.22–5.81)
RBC: 4.09 MIL/uL — ABNORMAL LOW (ref 4.22–5.81)
RDW: 13.6 % (ref 11.5–15.5)
RDW: 13.7 % (ref 11.5–15.5)
WBC: 8 10*3/uL (ref 4.0–10.5)
WBC: 11 10*3/uL — ABNORMAL HIGH (ref 4.0–10.5)
nRBC: 0 % (ref 0.0–0.2)
nRBC: 0 % (ref 0.0–0.2)

## 2020-04-15 LAB — MAGNESIUM
Magnesium: 2.4 mg/dL (ref 1.7–2.4)
Magnesium: 2.2 mg/dL (ref 1.7–2.4)

## 2020-04-15 MED ORDER — FUROSEMIDE 10 MG/ML IJ SOLN
20.0000 mg | Freq: Two times a day (BID) | INTRAMUSCULAR | Status: AC
  Administered 2020-04-15 – 2020-04-16 (×3): 20 mg via INTRAVENOUS
  Filled 2020-04-15 (×4): qty 2

## 2020-04-15 MED ORDER — ENOXAPARIN SODIUM 30 MG/0.3ML ~~LOC~~ SOLN
30.0000 mg | Freq: Every day | SUBCUTANEOUS | Status: DC
  Administered 2020-04-16 – 2020-04-20 (×5): 30 mg via SUBCUTANEOUS
  Filled 2020-04-15 (×5): qty 0.3

## 2020-04-15 MED ORDER — INSULIN ASPART 100 UNIT/ML ~~LOC~~ SOLN: 0.0000 [IU] | SUBCUTANEOUS | Status: DC

## 2020-04-15 NOTE — Progress Notes (Addendum)
[facility] DAILY ICU PROGRESS NOTE                   [address]            [place] [address]          [phone]   1 Day Post-Op Procedure(s) (LRB): THORACIC ASCENDING ANEURYSM REPAIR (AAA) (N/A) BENTALL AORTIC ROOT REPLACEMENT USING KONECT RESILIA 25MM AORTIC VALVED CONDUIT WITH REIMPLANTATION OF RIGHT AND LEFT CORONARY BUTTONS (N/A) TRANSESOPHAGEAL ECHOCARDIOGRAM (TEE) (N/A) AORTIC VALVE REPLACEMENT (AVR) (N/A)  Total Length of Stay:  LOS: 1 day   Subjective: Going very well, some chest incisional discomfort  Objective: Vital signs in last 24 hours: Temp:  [95.7 F (35.4 C)-100.4 F (38 C)] 100.2 F (37.9 C) (08/26 0623) Pulse Rate:  [77-81] 79 (08/26 0615) Cardiac Rhythm: Atrial paced (08/26 0000) Resp:  [12-32] 17 (08/26 0623) BP: (110-136)/(62-92) 115/74 (08/26 0600) SpO2:  [89 %-100 %] 92 % (08/26 0615) Arterial Line BP: (103-152)/(52-78) 130/52 (08/26 0623) FiO2 (%):  [40 %-50 %] 40 % (08/25 1759) Weight:  [72.4 kg] 72.4 kg (08/26 0500)  Filed Weights   04/14/20 0600 04/14/20 0603 04/15/20 0500  Weight: 69.9 kg 69 kg 72.4 kg    Weight change: 2.546 kg   Hemodynamic parameters for last 24 hours: PAP: (28-61)/(12-31) 38/19 CO:  [4.3 L/min-6.5 L/min] 6.5 L/min CI:  [2.4 L/min/m2-3.6 L/min/m2] 3.6 L/min/m2  Intake/Output from previous day: 08/25 0701 - 08/26 0700 In: 7350.7 [P.O.:30; I.V.:5022.6; Blood:780; IV Piggyback:1518.2] Out: 4401 [Urine:4410; Blood:1337; Chest Tube:850]  Intake/Output this shift: Total I/O In: 1400 [P.O.:30; I.V.:1056.9; IV Piggyback:313.1] Out: 1385 [Urine:885; Chest Tube:500]  Current Meds: Scheduled Meds: . acetaminophen  1,000 mg Oral Q6H  . aspirin EC  325 mg Oral Daily  . bisacodyl  10 mg Oral Daily   Or  . bisacodyl  10 mg Rectal Daily  . Chlorhexidine Gluconate Cloth  6 each Topical Daily  . docusate sodium  200 mg Oral Daily  . [START ON 04/16/2020] enoxaparin (LOVENOX) injection  30 mg Subcutaneous  QHS  . furosemide  20 mg Intravenous BID  . insulin aspart  0-24 Units Subcutaneous Q4H  . [START ON 04/16/2020] pantoprazole  40 mg Oral Daily  . sodium chloride flush  3 mL Intravenous Q12H   Continuous Infusions: . sodium chloride    . albumin human 12.5 g (04/14/20 1551)  . cefUROXime (ZINACEF)  IV Stopped (04/14/20 2034)  . lactated ringers Stopped (04/14/20 1605)  . lactated ringers Stopped (04/14/20 1604)  . nitroGLYCERIN Stopped (04/15/20 0618)   PRN Meds:.albumin human, morphine injection, ondansetron (ZOFRAN) IV, oxyCODONE, sodium chloride flush, sodium chloride flush, traMADol  General appearance: alert, cooperative and no distress Neurologic: MAE x 4 , grossly intact Heart: regular rate and rhythm Lungs: clear ant/lat Abdomen: benign Extremities: PAS in place, no edema Wound: dressings in place  Lab Results: CBC: Recent Labs    04/14/20 2034 04/15/20 0350  WBC 5.5 8.0  HGB 11.3* 11.7*  HCT 34.2* 34.6*  PLT 94* 117*   BMET:  Recent Labs    04/14/20 2034 04/15/20 0350  NA 136 134*  K 4.9 4.5  CL 108 105  CO2 19* 21*  GLUCOSE 132* 128*  BUN 8 8  CREATININE 0.95 0.97  CALCIUM 7.9* 7.8*    CMET: Lab Results  Component Value Date   WBC 8.0 04/15/2020   HGB 11.7 (L) 04/15/2020   HCT 34.6 (L) 04/15/2020   PLT 117 (L)  04/15/2020   GLUCOSE 128 (H) 04/15/2020   CHOL 133 02/02/2020   TRIG 57 02/02/2020   HDL 69 02/02/2020   LDLCALC 52 02/02/2020   ALT 14 04/14/2020   AST 27 04/14/2020   NA 134 (L) 04/15/2020   K 4.5 04/15/2020   CL 105 04/15/2020   CREATININE 0.97 04/15/2020   BUN 8 04/15/2020   CO2 21 (L) 04/15/2020   INR 2.0 (H) 04/14/2020   HGBA1C 5.3 04/12/2020      PT/INR:  Recent Labs    04/14/20 1430  LABPROT 21.7*  INR 2.0*   Radiology: DG Chest Port 1 View  Result Date: 04/14/2020 CLINICAL DATA:  Post aortic valve replacement EXAM: PORTABLE CHEST 1 VIEW COMPARISON:  04/12/2020 FINDINGS: Postoperative changes from aortic valve  replacement. Endotracheal tube is 4 cm above the carina. Swan-Ganz catheter is in the central pulmonary artery. NG tube tip is in the proximal stomach with the side port in the distal esophagus. No pneumothorax or confluent opacity. Heart is normal size. Aortic atherosclerosis. IMPRESSION: Postoperative changes. Support devices as above. No acute cardiopulmonary disease. Electronically Signed   By: Rolm Baptise M.D.   On: 04/14/2020 15:10   ECHO INTRAOPERATIVE TEE  Result Date: 04/14/2020  *INTRAOPERATIVE TRANSESOPHAGEAL REPORT *  Patient Name:   Kurt Allen [facility] Date of Exam: 04/14/2020 Medical Rec #:  [id number]            Height:       68.0 in Accession #:    [id number]           Weight:       152.1 lb Date of Birth:  12-25-1945            BSA:          1.82 m Patient Age:   74 years             BP:           167/89 mmHg Patient Gender: M                    HR:           62 bpm. Exam Location:  Inpatient Transesophogeal exam was perform intraoperatively during surgical procedure. Patient was closely monitored under general anesthesia during the entirety of examination. Indications:     Aortic stenosis Sonographer:     Dustin Flock RDCS Performing Phys: Suella Broad MD Diagnosing Phys: Suella Broad MD Complications: No known complications during this procedure.                               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 - Left Ventricle: LVEF unchanged at 50-55%, no significant RWMA's, CO > 4L/min. - Right Ventricle: The right ventricle appears unchanged from pre-bypass. - Aorta: s/p replacement of ascending thoracic aneurysm, no dissection or false lumen noted of the arch or descending aorta after cannula removal. - Left Atrium: The left atrium appears unchanged from pre-bypass. - Left Atrial Appendage: The left  atrial appendage appears unchanged from pre-bypass. - Aortic Valve: s/p bovine tissue valve  placement, no rocking, no bowing or leaks noted. Mean gradient 6 mmHg and 8 mmHg, respectively. - Mitral Valve: Trivial MR noted with central jet, unchanged from prebyass assessment. - Tricuspid Valve: The tricuspid valve appears unchanged from pre-bypass. - Interatrial Septum: The interatrial septum appears unchanged from pre-bypass. - Interventricular Septum: The interventricular septum appears unchanged from pre-bypass. - Pericardium: The pericardium appears unchanged from pre-bypass. PRE-OP FINDINGS  Left Ventricle: The left ventricle has low normal systolic function, with an ejection fraction of 50-55%. The cavity size was normal. There is moderately increased left ventricular wall thickness. No evidence of left ventricular regional wall motion abnormalities. Right Ventricle: The right ventricle has normal systolic function. The cavity was normal. There is no increase in right ventricular wall thickness. PA catheter traversing the RV in to the main PA. Left Atrium: Left atrial size was normal in size. The left atrial appendage is well visualized and there is no evidence of thrombus present. Left atrial appendage velocity is normal at greater than 40 cm/s. Right Atrium: Right atrial size was normal in size. PA catheter traversing the RA in to the RV. Interatrial Septum: No atrial level shunt detected by color flow Doppler. Pericardium: There is no evidence of pericardial effusion. There is no pleural effusion. Mitral Valve: The mitral valve is normal in structure. Mild thickening of the mitral valve leaflet. Mitral valve regurgitation is trivial by color flow Doppler. The MR jet is centrally-directed. There is no evidence of mitral valve vegetation. There is No evidence of mitral stenosis. Tricuspid Valve: The tricuspid valve was normal in structure. Tricuspid valve regurgitation was not visualized by color flow Doppler.  There is no evidence of tricuspid valve vegetation. Aortic Valve: The aortic valve has a bicuspid appearance with severely reduced mobility. Cannot rule out fusion of the LCC and RCC. There is severe thickening and calcfication of the aortic valve. Trivial to mild aortic valve regurgitation is noted by color flow Doppler. There is severe stenosis of the aortic valve with a mean gradient of 40 mmHg and peak gradient > 70 mmHg.  Pulmonic Valve: The pulmonic valve was normal in structure. Pulmonic valve regurgitation is trivial by color flow Doppler. 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 There is evidence of layered and protruding immobile plaque in the aortic arch and descending aorta; Grade III, measuring 3-88mm in size. The descending aorta does  not appear dilated. Pulmonary Artery: The pulmonary artery is of normal size. Venous: The inferior vena cava was not well visualized. +-------------+---------++ AORTIC VALVE           +-------------+---------++ AV Mean Grad:40.0 mmHg +-------------+---------++  Suella Broad MD Electronically signed by Suella Broad MD Signature Date/Time: 04/14/2020/3:58:15 PM    Final      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 25MM AORTIC VALVED CONDUIT WITH REIMPLANTATION OF RIGHT AND LEFT CORONARY BUTTONS (N/A) TRANSESOPHAGEAL ECHOCARDIOGRAM (TEE) (N/A) AORTIC VALVE REPLACEMENT (AVR) (N/A)  POD#1  1 hemodyn stable off gtts, mildly elev PAP, excellent cardiac indicies 2 HR under pacer 60, cont a pace for now, EKG sinus or poss junct brady 3 excellent UOP, begin gentle diuresis, normal BUN/Creat 4 mod CT drainage, leave tubes today 5 minor expected acute blood loss anemia, monitor clinically 6 tmax 100.4 , no leukocytosis- systemic inflamm response is mild 7 minor thrombocytopenia, reactive- monitor 8 BS well  controlled- stop insulin gtt 9 CXR  lung fields with some atx, vasc congestion- routine pulm toilet/diurese 10 routine diet/activity progression- see orders  John Giovanni PA-C Pager [phone] 04/15/2020 7:00 AM    I have seen and examined the patient and agree with the assessment and plan as outlined.  Doing well POD1.  Sinus brady w/ partial RBBB, AAI pacing w/ stable hemodynamics off all drips except NTG for mild HTN.  Breathing comfortably on nasal cannula and CXR looks good although somewhat dilated loops of bowel +/- free intraperitoneal air.  Abdomen mildly distended and tympanitic but non-tender to deep palpation.  Check KUB.  Otherwise continue routine postop care.   Rexene Alberts, MD 04/15/2020 7:36 AM

## 2020-04-15 NOTE — Discharge Summary (Signed)
Physician Discharge Summary  Patient ID: Kurt Allen MRN: [id number] DOB/AGE: 1945/10/07 74 y.o.  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 S/P ascending aortic aneurysm repair 04/14/2020  . 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 Tobacco abuse   . Pulmonary nodule less than 1 cm in diameter with moderate to high risk for malignant neoplasm 03/10/2020  . Centrilobular emphysema ([place])   . Thoracic ascending aortic aneurysm ([place])   . Severe aortic stenosis     HPI: at time of consultation  Patient is 74 year old male with longstanding history of heart murmur and tobacco abuse who has been referred for surgical consultation to discuss treatment options for management of recently discovered severe symptomatic aortic stenosis with aneurysm involving the ascending thoracic aorta.  Patient states that he was first told that he had a heart murmur as a teenager but he has never had a formal cardiac evaluation until recently. He has a longstanding history of tobacco abuse and underwent low-dose screening CT scan without contrast to rule out the possibility of lung cancer. CT scan revealed findings consistent with centrilobular and paraseptal emphysema as well as an 8.2 mm opacity at the apex of the right lung that was possibly consistent with scarring but worthy of follow-up. CT scan also revealed dilated ascending thoracic aorta which measured 5.1 cm in its greatest diameter. The patient was referred for cardiac evaluation and seen by Dr. Einar Gip who noted a  prominent systolic murmur on physical exam. Transthoracic echocardiogram performed in his office revealed normal left ventricular systolic function with severe aortic stenosis. Peak velocity across aortic valve was measured 4.1 m/s corresponding to mean transvalvular gradient estimated 44 mmHg. 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 Diagnostic cardiac catheterization performed February 17, 2020 revealed normal coronary artery anatomy with no significant coronary artery disease. Right heart pressures and hemodynamics were normal. Cardiothoracic surgical consultation was requested.  Patient is separated and lives with his friend in [place]. 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 He has remained reasonably active physically and retirement and is entirely functionally independent. He admits to a several year history of symptoms of exertional shortness of breath and fatigue. The patient only gets short of breath with moderate level activity and always recovers quickly when he stops to rest. He has had occasional slight dizzy spells and approximately 2 years ago had a brief syncopal episode.He denies any chest pain either with activity or at rest. He denies any history of resting shortness of breath, PND, orthopnea, or lower extremity edema. He has a chronic dry cough that is nonproductive. He was treated for bronchitis and possible pneumonia last February. He has a longstanding history of tobacco abuse dating back more than 60 years. He currently smokes less than 1/2 pack cigarettes daily but in the past he has smoked as much as 2 packs a day.  Patient was originally seen in consultation on March 22, 2020 at which time we made tentative plans to proceed with definitive surgical  intervention later this week. He was instructed to  stop smoking if at all possible at that time. The patient states that he has tried very hard to quit smoking and has cut back to smoking 1 cigarette daily. He reports no new problems or complaints. His breathing has not changed. He still gets short of breath with exertion but it has not gotten any worse. He reports occasional nonproductive cough. He has not had productive cough, hemoptysis, nor wheezing. He has not had fevers or chills. He has not been exposed to any persons with known or suspected COVID-19 infection in the patient was previously vaccinated against the COVID-19 virus, having received both doses of the Pfizer vaccine.  He was admitted electively for the procedure  Discharged Condition: good  Hospital Course: The patient was admitted electively and on 04/14/2020 taken to the operating room where he underwent the below described procedure.  He tolerated it well and was taken to the surgical intensive care unit in stable condition.  Postoperative hospital course:  The patient was extubated without difficulty using standard protocols.  He is remained neurologically intact.  He initially did require temporary pacing for underlying bradycardia.  He was noted to have a partial right bundle branch block.  All drips were stopped on postoperative day #1.  Chest tube showed moderate drainage and were continued in place until decreased over time.  On postoperative day two he did go into atrial fibrillation and was started on intravenous amiodarone and has subsequently been converted to sinus rhythm.  He did have some associated bradycardia so once intravenous amiodarone drip was off it was felt it should not be continued orally.  He did have moderate postoperative volume overload but has responded well to diuretics and is euvolemic at time of discharge.  He had an expected acute blood loss anemia which was stable.  Most recent hemoglobin adequate dated 04/19/2020 are 10.4 and 31.2 respectively.   Pacer wires were discontinued on postoperative day #5.  On postoperative day #5 he did have a recurrence of atrial fibrillation with rate into the 120s.  This was short-term and has not recurred but it was felt at the time that he should be initiated on Coumadin therapy.  He is tolerating gradually increasing activities using standard protocols.  Incisions are noted to be healing well without evidence of infection.  He is tolerating diet.  Oxygen is weaned and he maintains good saturations on room air.  At the time of discharge the patient is felt to be quite stable.  Consults: None  Significant Diagnostic Studies: routine post-op serial labs and CXR's  Treatments: surgery:  CARDIOTHORACIC SURGERY OPERATIVE NOTE  Date of Procedure:                04/14/2020  Preoperative Diagnosis:      Severe Aortic Stenosis   Postoperative Diagnosis:    Same   Procedure:        Biological Bentall Aortic Root Replacement             Edwards Konect Resilia stented bovine pericardial tissue valved conduit (size 25 mm, model # 11060A, serial # [id number])              Reimplantation of left main and right coronary artery             Replacement of ascending thoracic aortic aneurysm               Surgeon:  Valentina Gu. Roxy Manns, MD  Assistant:       John Giovanni, PA-C  Anesthesia:    Suella Broad, MD  Operative Findings: ? Sievers type I bicuspid aortic valve with severe aortic stenosis ? Fusiform aneurysmal dilitation of the aortic root and entire ascending thoracic aorta ? Normal left ventricular systolic function Discharge Exam: Blood pressure 112/73, pulse 76, temperature 98.3 F (36.8 C), temperature source Oral, resp. rate 18, height 5\' 8"  (1.727 m), weight 69.7 kg, SpO2 92 %.  General appearance: alert, cooperative and no distress Heart: regular rate and rhythm Lungs: clear to auscultation bilaterally Abdomen: benign Extremities: no edema Wound: incis healing well Disposition:  Discharge disposition: 01-Home or Self Care       Discharge Instructions    Amb Referral to Cardiac Rehabilitation   Complete by: As directed    Diagnosis: Valve Replacement   Valve: Aortic   After initial evaluation and assessments completed: Virtual Based Care may be provided alone or in conjunction with Phase 2 Cardiac Rehab based on patient barriers.: Yes   Discharge patient   Complete by: As directed    Discharge disposition: 01-Home or Self Care   Discharge patient date: 04/21/2020     Allergies as of 04/21/2020   No Known Allergies     Medication List    STOP taking these medications   metoprolol succinate 50 MG 24 hr tablet Commonly known as: TOPROL-XL     TAKE these medications   acetaminophen 325 MG tablet Commonly known as: TYLENOL Take 2 tablets (650 mg total) by mouth every 6 (six) hours as needed for mild pain.   albuterol 108 (90 Base) MCG/ACT inhaler Commonly known as: VENTOLIN HFA Inhale 1 puff into the lungs every 4 (four) hours as needed for wheezing or shortness of breath.   aspirin 81 MG EC tablet Take 1 tablet (81 mg total) by mouth daily. Swallow whole.   fluticasone 50 MCG/ACT nasal spray Commonly known as: FLONASE Place 1 spray into both nostrils daily as needed for rhinitis.   magnesium oxide 400 (241.3 Mg) MG tablet Commonly known as: MAG-OX Take 1 tablet (400 mg total) by mouth 2 (two) times daily.   metoprolol tartrate 25 MG tablet Commonly known as: LOPRESSOR Take 0.5 tablets (12.5 mg total) by mouth 2 (two) times daily.   nicotine 21 mg/24hr patch Commonly known as: NICODERM CQ - dosed in mg/24 hours Place 21 mg onto the skin daily.   rosuvastatin 10 MG tablet Commonly known as: CRESTOR Take 0.5 tablets (5 mg total) by mouth daily.   tamsulosin 0.4 MG Caps capsule Commonly known as: FLOMAX Take 0.4 mg by mouth daily.   traMADol 50 MG tablet Commonly known as: ULTRAM Take 1 tablet (50 mg total) by mouth every 6 (six) hours  as needed for moderate pain.   Vitamin D3 1.25 MG (50000 UT) Caps Take 50,000 Units by mouth once a week.   warfarin 2.5 MG tablet Commonly known as: COUMADIN Take 1 tablet (2.5 mg total) by mouth daily at 6 PM. As directed by coumadin clinic at cardiologist office       Follow-up Information    Rexene Alberts, MD Follow up.   Specialty: Cardiothoracic Surgery Why: Please see discharge paperwork for follow-up appointment with surgeon.  Please obtain a chest x-ray at [facility] 1/2-hour prior to the appointment.  It is located in the same office complex on the first floor. Contact information: [address]  [place] [address] [phone]        Adrian Prows, MD Follow up.   Specialty: Cardiology Why: 05/14/2020 at 11:45 am Contact information: [place] [address] [phone]               Signed: John Giovanni PA-C 04/21/2020, 9:12 AM

## 2020-04-15 NOTE — Progress Notes (Signed)
EVENING ROUNDS NOTE :     [address]       [place] [address]             [phone]                 1 Day Post-Op Procedure(s) (LRB): THORACIC ASCENDING ANEURYSM REPAIR (AAA) (N/A) BENTALL AORTIC ROOT REPLACEMENT USING KONECT RESILIA 25MM AORTIC VALVED CONDUIT WITH REIMPLANTATION OF RIGHT AND LEFT CORONARY BUTTONS (N/A) TRANSESOPHAGEAL ECHOCARDIOGRAM (TEE) (N/A) AORTIC VALVE REPLACEMENT (AVR) (N/A)   Total Length of Stay:  LOS: 1 day  Events:   No events Resting comfortably    BP 96/68   Pulse 71   Temp 98.4 F (36.9 C) (Oral)   Resp 20   Ht 5\' 8"  (1.727 m)   Wt 72.4 kg   SpO2 93%   BMI 24.27 kg/m   PAP: (32-61)/(12-31) 50/23 CO:  [5.7 L/min-6.5 L/min] 6.5 L/min CI:  [3.1 L/min/m2-3.6 L/min/m2] 3.6 L/min/m2     . sodium chloride    . cefUROXime (ZINACEF)  IV Stopped (04/15/20 1044)  . lactated ringers Stopped (04/14/20 1605)  . lactated ringers Stopped (04/14/20 1604)  . nitroGLYCERIN Stopped (04/15/20 0618)    I/O last 3 completed shifts: In: 7350.7 [P.O.:30; I.V.:5022.6; Blood:780; IV Piggyback:1518.2] Out: 6203 [Urine:4410; Blood:1337; Chest Tube:850]   CBC Latest Ref Rng & Units 04/15/2020 04/15/2020 04/14/2020  WBC 4.0 - 10.5 K/uL 11.0(H) 8.0 5.5  Hemoglobin 13.0 - 17.0 g/dL 12.9(L) 11.7(L) 11.3(L)  Hematocrit 39 - 52 % 38.7(L) 34.6(L) 34.2(L)  Platelets 150 - 400 K/uL 122(L) 117(L) 94(L)    BMP Latest Ref Rng & Units 04/15/2020 04/14/2020 04/14/2020  Glucose 70 - 99 mg/dL 128(H) 132(H) -  BUN 8 - 23 mg/dL 8 8 -  Creatinine 0.61 - 1.24 mg/dL 0.97 0.95 -  BUN/Creat Ratio 6 - 22 (calc) - - -  Sodium 135 - 145 mmol/L 134(L) 136 138  Potassium 3.5 - 5.1 mmol/L 4.5 4.9 5.1  Chloride 98 - 111 mmol/L 105 108 -  CO2 22 - 32 mmol/L 21(L) 19(L) -  Calcium 8.9 - 10.3 mg/dL 7.8(L) 7.9(L) -    ABG    Component Value Date/Time   PHART 7.322 (L) 04/14/2020 1936   PCO2ART 44.3 04/14/2020 1936   PO2ART 129 (H) 04/14/2020 1936   HCO3 22.7  04/14/2020 1936   TCO2 24 04/14/2020 1936   ACIDBASEDEF 3.0 (H) 04/14/2020 1936   O2SAT 99.0 04/14/2020 1936       Melodie Bouillon, MD 04/15/2020 6:14 PM

## 2020-04-16 ENCOUNTER — Inpatient Hospital Stay (HOSPITAL_COMMUNITY): Payer: Medicare HMO

## 2020-04-16 ENCOUNTER — Encounter (HOSPITAL_COMMUNITY): Payer: Self-pay | Admitting: Thoracic Surgery (Cardiothoracic Vascular Surgery)

## 2020-04-16 LAB — CBC
HCT: 37.3 % — ABNORMAL LOW (ref 39.0–52.0)
Hemoglobin: 12.3 g/dL — ABNORMAL LOW (ref 13.0–17.0)
MCH: 31.1 pg (ref 26.0–34.0)
MCHC: 33 g/dL (ref 30.0–36.0)
MCV: 94.4 fL (ref 80.0–100.0)
Platelets: 113 10*3/uL — ABNORMAL LOW (ref 150–400)
RBC: 3.95 MIL/uL — ABNORMAL LOW (ref 4.22–5.81)
RDW: 13.6 % (ref 11.5–15.5)
WBC: 10.6 10*3/uL — ABNORMAL HIGH (ref 4.0–10.5)
nRBC: 0 % (ref 0.0–0.2)

## 2020-04-16 LAB — BASIC METABOLIC PANEL
Anion gap: 9 (ref 5–15)
BUN: 9 mg/dL (ref 8–23)
CO2: 23 mmol/L (ref 22–32)
Calcium: 8.4 mg/dL — ABNORMAL LOW (ref 8.9–10.3)
Chloride: 100 mmol/L (ref 98–111)
Creatinine, Ser: 1.03 mg/dL (ref 0.61–1.24)
GFR calc Af Amer: >60 mL/min (ref >60)
GFR calc non Af Amer: >60 mL/min (ref >60)
Glucose, Bld: 108 mg/dL — ABNORMAL HIGH (ref 70–99)
Potassium: 4 mmol/L (ref 3.5–5.1)
Sodium: 132 mmol/L — ABNORMAL LOW (ref 135–145)

## 2020-04-16 LAB — GLUCOSE, CAPILLARY
Glucose-Capillary: 110 mg/dL — ABNORMAL HIGH (ref 70–99)
Glucose-Capillary: 116 mg/dL — ABNORMAL HIGH (ref 70–99)
Glucose-Capillary: 121 mg/dL — ABNORMAL HIGH (ref 70–99)
Glucose-Capillary: 83 mg/dL (ref 70–99)
Glucose-Capillary: 93 mg/dL (ref 70–99)

## 2020-04-16 LAB — SURGICAL PATHOLOGY

## 2020-04-16 MED ORDER — ~~LOC~~ CARDIAC SURGERY, PATIENT & FAMILY EDUCATION: Freq: Once | Status: DC

## 2020-04-16 MED ORDER — POTASSIUM CHLORIDE CRYS ER 20 MEQ PO TBCR
20.0000 meq | EXTENDED_RELEASE_TABLET | Freq: Every day | ORAL | Status: DC
  Administered 2020-04-16 – 2020-04-18 (×3): 20 meq via ORAL
  Filled 2020-04-16 (×3): qty 1

## 2020-04-16 MED ORDER — AMIODARONE HCL IN DEXTROSE 360-4.14 MG/200ML-% IV SOLN
30.0000 mg/h | INTRAVENOUS | Status: DC
  Administered 2020-04-16 – 2020-04-17 (×2): 30 mg/h via INTRAVENOUS
  Filled 2020-04-16 (×2): qty 200

## 2020-04-16 MED ORDER — ALBUTEROL SULFATE (2.5 MG/3ML) 0.083% IN NEBU: 2.5000 mg | INHALATION_SOLUTION | RESPIRATORY_TRACT | Status: DC | PRN

## 2020-04-16 MED ORDER — AMIODARONE LOAD VIA INFUSION
150.0000 mg | Freq: Once | INTRAVENOUS | Status: AC
  Administered 2020-04-16: 150 mg via INTRAVENOUS
  Filled 2020-04-16: qty 83.34

## 2020-04-16 MED ORDER — AMIODARONE HCL IN DEXTROSE 360-4.14 MG/200ML-% IV SOLN
60.0000 mg/h | INTRAVENOUS | Status: AC
  Administered 2020-04-16 (×2): 60 mg/h via INTRAVENOUS
  Filled 2020-04-16 (×2): qty 200

## 2020-04-16 MED ORDER — FUROSEMIDE 40 MG PO TABS
40.0000 mg | ORAL_TABLET | Freq: Every day | ORAL | Status: DC
  Administered 2020-04-16 – 2020-04-18 (×3): 40 mg via ORAL
  Filled 2020-04-16 (×3): qty 1

## 2020-04-16 NOTE — Hospital Course (Addendum)
The patient was admitted electively and on 04/14/2020 taken to the operating room at which time he underwent biological Bentall aortic root replacement with a Edwards connect wrist cilia stented bovine pericardial tissue valve conduit.  He tolerated the procedure well and was taken to the surgical intensive care unit in stable condition.  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  He was extubated without difficulty using standard protocols.  He maintained stable hemodynamics requiring no drips except nitroglycerin for mild hypertension.Marland Kitchen  He initially did require atrial pacing for a borderline bradycardia with partial right bundle branch block.  Oxygen saturations were good on nasal cannula and chest x-ray was stable.  There were some findings of dilated loops of bowel and abdominal x-ray was obtained as there was a question of free intraperitoneal air.  The abdominal x-ray was negative for this finding.  He was started on overall course of routine postoperative care.  On postop day #2 he did go into atrial fibrillation and has subsequently been started on an amiodarone drip.  His chest tubes are still in place for moderate drainage.  He does have some postoperative volume overload and is being diuresed.  Renal function has remained normal.  He has a mild acute blood loss anemia which is being monitored clinically with follow-up testing over time.  He has a mild thrombocytopenia which is stable at 113 K today.  Blood sugars are under good control.  He is tolerating routine mobilization pulmonary toilet.   He has continued to make good overall progress and heart rhythm has stabilized to sinus  rhythm.  He has continued to diurese well and clinically does not look very volume overloaded.  He does have some hyponatremia with sodium 128.  Pacer wires are removed on postoperative day #5.  He has continued to do well in regards to his activity progression and pulmonary toilet.  Incisions are noted to be healing well without evidence of infection.  He is tolerating diet well.  His anemia is very stable with most recent hemoglobin hematocrit dated 04/19/2020 10.4 and 31.2 respectively.  Renal function is within normal limits with most recent BUN/creatinine dated 04/19/2020  33 and 0.93.

## 2020-04-16 NOTE — Progress Notes (Addendum)
[facility] DAILY ICU PROGRESS NOTE                   [address]            [place] [address]          [phone]   2 Days Post-Op Procedure(s) (LRB): THORACIC ASCENDING ANEURYSM REPAIR (AAA) (N/A) BENTALL AORTIC ROOT REPLACEMENT USING KONECT RESILIA 25MM AORTIC VALVED CONDUIT WITH REIMPLANTATION OF RIGHT AND LEFT CORONARY BUTTONS (N/A) TRANSESOPHAGEAL ECHOCARDIOGRAM (TEE) (N/A) AORTIC VALVE REPLACEMENT (AVR) (N/A)  Total Length of Stay:  LOS: 2 days   Subjective: Went into afib with morning ambulation, HR in 120's mostly  Objective: Vital signs in last 24 hours: Temp:  [97.8 F (36.6 C)-99.9 F (37.7 C)] 97.9 F (36.6 C) (08/27 0650) Pulse Rate:  [65-107] 107 (08/27 0600) Cardiac Rhythm: Atrial fibrillation (08/27 0646) Resp:  [12-24] 18 (08/27 0600) BP: (93-145)/(62-117) 113/75 (08/27 0600) SpO2:  [89 %-100 %] 100 % (08/27 0600) Arterial Line BP: (100-104)/(47-50) 100/50 (08/26 0810) Weight:  [71.6 kg] 71.6 kg (08/27 0500)  Filed Weights   04/14/20 0603 04/15/20 0500 04/16/20 0500  Weight: 69 kg 72.4 kg 71.6 kg    Weight change: -0.8 kg   Hemodynamic parameters for last 24 hours: PAP: (48-50)/(22-24) 50/23  Intake/Output from previous day: 08/26 0701 - 08/27 0700 In: 1075.4 [P.O.:810; I.V.:65.4; IV Piggyback:200] Out: 2140 [Urine:1770; Chest Tube:370]  Intake/Output this shift: Total I/O In: 550 [P.O.:450; IV Piggyback:100] Out: 950 [Urine:700; Chest Tube:250]  Current Meds: Scheduled Meds: . acetaminophen  1,000 mg Oral Q6H  . aspirin EC  325 mg Oral Daily  . bisacodyl  10 mg Oral Daily   Or  . bisacodyl  10 mg Rectal Daily  . Chlorhexidine Gluconate Cloth  6 each Topical Daily  . [facility], Patient & Family Education   Does not apply Once  . docusate sodium  200 mg Oral Daily  . enoxaparin (LOVENOX) injection  30 mg Subcutaneous QHS  . furosemide  20 mg Intravenous BID  . furosemide  40 mg Oral Daily  . pantoprazole   40 mg Oral Daily  . potassium chloride  20 mEq Oral Daily  . sodium chloride flush  3 mL Intravenous Q12H   Continuous Infusions: . sodium chloride    . cefUROXime (ZINACEF)  IV 1.5 g (04/15/20 2129)  . lactated ringers Stopped (04/14/20 1605)  . lactated ringers Stopped (04/14/20 1604)   PRN Meds:.albuterol, morphine injection, ondansetron (ZOFRAN) IV, oxyCODONE, sodium chloride flush, sodium chloride flush, traMADol  General appearance: alert, cooperative and no distress Heart: irregularly irregular rhythm Lungs: min dim in bases Abdomen: benign Extremities: no edema Wound: dressings CDI  Lab Results: CBC: Recent Labs    04/15/20 1721 04/16/20 0440  WBC 11.0* 10.6*  HGB 12.9* 12.3*  HCT 38.7* 37.3*  PLT 122* 113*   BMET:  Recent Labs    04/15/20 1721 04/16/20 0440  NA 133* 132*  K 4.5 4.0  CL 100 100  CO2 24 23  GLUCOSE 156* 108*  BUN 11 9  CREATININE 1.16 1.03  CALCIUM 8.5* 8.4*    CMET: Lab Results  Component Value Date   WBC 10.6 (H) 04/16/2020   HGB 12.3 (L) 04/16/2020   HCT 37.3 (L) 04/16/2020   PLT 113 (L) 04/16/2020   GLUCOSE 108 (H) 04/16/2020   CHOL 133 02/02/2020   TRIG 57 02/02/2020   HDL 69 02/02/2020   LDLCALC 52 02/02/2020  ALT 14 04/14/2020   AST 27 04/14/2020   NA 132 (L) 04/16/2020   K 4.0 04/16/2020   CL 100 04/16/2020   CREATININE 1.03 04/16/2020   BUN 9 04/16/2020   CO2 23 04/16/2020   INR 2.0 (H) 04/14/2020   HGBA1C 5.3 04/12/2020      PT/INR:  Recent Labs    04/14/20 1430  LABPROT 21.7*  INR 2.0*   Radiology: DG Abd 2 Views  Result Date: 04/15/2020 CLINICAL DATA:  Free intraperitoneal status post cardiac surgery. EXAM: ABDOMEN - 2 VIEW COMPARISON:  Radiograph of same day. FINDINGS: The bowel gas pattern is normal. There is no evidence of free air. No radio-opaque calculi or other significant radiographic abnormality is seen. IMPRESSION: Negative. Electronically Signed   By: Marijo Conception M.D.   On: 04/15/2020  10:03     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 25MM AORTIC VALVED CONDUIT WITH REIMPLANTATION OF RIGHT AND LEFT CORONARY BUTTONS (N/A) TRANSESOPHAGEAL ECHOCARDIOGRAM (TEE) (N/A) AORTIC VALVE REPLACEMENT (AVR) (N/A)  1 doing well overall POD#2 2 now in afib, initiating amiodarone protocol, K+ and MG++ have been in normal range 3 normal renal fxn, good UOP, weight is stable, slowly trending lower- getting lasix 4 minor ABL anemia- monitoring clinically 5 thrombocytopenia is pretty stable at 113K 6 BS well controlled 7 370 cc for 24 h, keep for now 8 cont pulm toilet, albuterol added with COPD/emphysema 9 mobilize per protocols   Kurt Giovanni PA-C Pager [phone] 04/16/2020 6:56 AM    I have seen and examined the patient and agree with the assessment and plan as outlined.  Overall doing well although went into Afib this morning shortly after walking in halls, HR 110-120 w/ stable BP.  Otherwise doing well.   Start amiodarone  Mobilize  Gentle diuresis  Possible transfer 4E PCU later today or tomorrow  Rexene Alberts, MD 04/16/2020 8:10 AM

## 2020-04-16 NOTE — Progress Notes (Signed)
Patient ID: Kurt Allen, male   DOB: 27-Feb-1946, 74 y.o.   MRN: [id number] EVENING ROUNDS NOTE :     [address]       [place] [address]             [phone]                 2 Days Post-Op Procedure(s) (LRB): THORACIC ASCENDING ANEURYSM REPAIR (AAA) (N/A) BENTALL AORTIC ROOT REPLACEMENT USING KONECT RESILIA 25MM AORTIC VALVED CONDUIT WITH REIMPLANTATION OF RIGHT AND LEFT CORONARY BUTTONS (N/A) TRANSESOPHAGEAL ECHOCARDIOGRAM (TEE) (N/A) AORTIC VALVE REPLACEMENT (AVR) (N/A)  Total Length of Stay:  LOS: 2 days  BP 103/69   Pulse 80   Temp 98.3 F (36.8 C)   Resp 15   Ht 5\' 8"  (1.727 m)   Wt 71.6 kg   SpO2 100%   BMI 24.00 kg/m   .Intake/Output      08/26 0701 - 08/27 0700 08/27 0701 - 08/28 0700   P.O. 810 360   I.V. (mL/kg) 65.4 (0.9) 298.3 (4.2)   Blood     IV Piggyback 200 100.1   Total Intake(mL/kg) 1075.4 (15) 758.4 (10.6)   Urine (mL/kg/hr) 1770 (1) 1250 (1.5)   Blood     Chest Tube 370 20   Total Output 2140 1270   Net -1064.6 -511.6          . sodium chloride Stopped (04/16/20 0934)  . amiodarone 30 mg/hr (04/16/20 1843)  . lactated ringers Stopped (04/14/20 1605)  . lactated ringers Stopped (04/14/20 1604)     Lab Results  Component Value Date   WBC 10.6 (H) 04/16/2020   HGB 12.3 (L) 04/16/2020   HCT 37.3 (L) 04/16/2020   PLT 113 (L) 04/16/2020   GLUCOSE 108 (H) 04/16/2020   CHOL 133 02/02/2020   TRIG 57 02/02/2020   HDL 69 02/02/2020   LDLCALC 52 02/02/2020   ALT 14 04/14/2020   AST 27 04/14/2020   NA 132 (L) 04/16/2020   K 4.0 04/16/2020   CL 100 04/16/2020   CREATININE 1.03 04/16/2020   BUN 9 04/16/2020   CO2 23 04/16/2020   INR 2.0 (H) 04/14/2020   HGBA1C 5.3 04/12/2020   vvi pacing with intermittent block and afib    Grace Isaac MD  Beeper [phone] Office [phone] 04/16/2020 6:49 PM

## 2020-04-16 NOTE — Plan of Care (Signed)
Slept short periods, prefers to be OOB to chair, Oxycodone taken once, denies need for extra pain med in addition to the scheduled tylenol. Needs reminding to use IS. Ambulates well, steady gait  Problem: Education: Goal: Knowledge of General Education information will improve Description: Including pain rating scale, medication(s)/side effects and non-pharmacologic comfort measures Outcome: Progressing   Problem: Health Behavior/Discharge Planning: Goal: Ability to manage health-related needs will improve Outcome: Progressing   Problem: Clinical Measurements: Goal: Ability to maintain clinical measurements within normal limits will improve Outcome: Progressing Goal: Will remain free from infection Outcome: Progressing Goal: Diagnostic test results will improve Outcome: Progressing Goal: Respiratory complications will improve Outcome: Progressing Goal: Cardiovascular complication will be avoided Outcome: Progressing   Problem: Activity: Goal: Risk for activity intolerance will decrease Outcome: Progressing   Problem: Nutrition: Goal: Adequate nutrition will be maintained Outcome: Progressing   Problem: Coping: Goal: Level of anxiety will decrease Outcome: Progressing   Problem: Elimination: Goal: Will not experience complications related to bowel motility Outcome: Progressing Goal: Will not experience complications related to urinary retention Outcome: Progressing   Problem: Pain Managment: Goal: General experience of comfort will improve Outcome: Progressing   Problem: Safety: Goal: Ability to remain free from injury will improve Outcome: Progressing   Problem: Skin Integrity: Goal: Risk for impaired skin integrity will decrease Outcome: Progressing

## 2020-04-17 ENCOUNTER — Inpatient Hospital Stay (HOSPITAL_COMMUNITY): Payer: Medicare HMO

## 2020-04-17 LAB — CBC
HCT: 32.7 % — ABNORMAL LOW (ref 39.0–52.0)
Hemoglobin: 11 g/dL — ABNORMAL LOW (ref 13.0–17.0)
MCH: 30.9 pg (ref 26.0–34.0)
MCHC: 33.6 g/dL (ref 30.0–36.0)
MCV: 91.9 fL (ref 80.0–100.0)
Platelets: 117 10*3/uL — ABNORMAL LOW (ref 150–400)
RBC: 3.56 MIL/uL — ABNORMAL LOW (ref 4.22–5.81)
RDW: 13.3 % (ref 11.5–15.5)
WBC: 7.4 10*3/uL (ref 4.0–10.5)
nRBC: 0 % (ref 0.0–0.2)

## 2020-04-17 LAB — BASIC METABOLIC PANEL
Anion gap: 9 (ref 5–15)
BUN: 12 mg/dL (ref 8–23)
CO2: 26 mmol/L (ref 22–32)
Calcium: 8 mg/dL — ABNORMAL LOW (ref 8.9–10.3)
Chloride: 94 mmol/L — ABNORMAL LOW (ref 98–111)
Creatinine, Ser: 0.87 mg/dL (ref 0.61–1.24)
GFR calc Af Amer: >60 mL/min (ref >60)
GFR calc non Af Amer: >60 mL/min (ref >60)
Glucose, Bld: 112 mg/dL — ABNORMAL HIGH (ref 70–99)
Potassium: 3.6 mmol/L (ref 3.5–5.1)
Sodium: 129 mmol/L — ABNORMAL LOW (ref 135–145)

## 2020-04-17 LAB — MAGNESIUM: Magnesium: 1.8 mg/dL (ref 1.7–2.4)

## 2020-04-17 MED ORDER — ORAL CARE MOUTH RINSE
15.0000 mL | Freq: Two times a day (BID) | OROMUCOSAL | Status: DC
  Administered 2020-04-17 – 2020-04-20 (×8): 15 mL via OROMUCOSAL

## 2020-04-17 MED ORDER — POTASSIUM CHLORIDE CRYS ER 20 MEQ PO TBCR
20.0000 meq | EXTENDED_RELEASE_TABLET | ORAL | Status: AC
  Administered 2020-04-17 (×3): 20 meq via ORAL
  Filled 2020-04-17 (×3): qty 1

## 2020-04-17 NOTE — Progress Notes (Signed)
Patient ID: Kurt Allen, male   DOB: Jun 13, 1946, 74 y.o.   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 25MM AORTIC VALVED CONDUIT WITH REIMPLANTATION OF RIGHT AND LEFT CORONARY BUTTONS (N/A) TRANSESOPHAGEAL ECHOCARDIOGRAM (TEE) (N/A) AORTIC VALVE REPLACEMENT (AVR) (N/A)  Total Length of Stay:  LOS: 3 days   Subjective: Patient awake alert neurologically intact, feels well Patient was completely VVI pacing-underlying rhythm is sinus at the 50s to 60, he will atrially pace-currently DDD pacing set at rate 80  Objective: Vital signs in last 24 hours: Temp:  [97.3 F (36.3 C)-98.3 F (36.8 C)] 98 F (36.7 C) (08/28 0756) Pulse Rate:  [30-123] 71 (08/28 0420) Cardiac Rhythm: Ventricular paced (08/28 0000) Resp:  [14-25] 16 (08/28 0420) BP: (97-115)/(59-74) 102/66 (08/28 0420) SpO2:  [86 %-100 %] 100 % (08/28 0420) Weight:  [70.8 kg] 70.8 kg (08/28 0600)  Filed Weights   04/15/20 0500 04/16/20 0500 04/17/20 0600  Weight: 72.4 kg 71.6 kg 70.8 kg    Weight change: -0.8 kg   Hemodynamic parameters for last 24 hours:    Intake/Output from previous day: 08/27 0701 - 08/28 0700 In: 1521.5 [P.O.:850; I.V.:571.4; IV Piggyback:100.1] Out: 1810 [Urine:1650; Chest Tube:160]  Intake/Output this shift: No intake/output data recorded.  Current Meds: Scheduled Meds: . acetaminophen  1,000 mg Oral Q6H  . aspirin EC  325 mg Oral Daily  . bisacodyl  10 mg Oral Daily   Or  . bisacodyl  10 mg Rectal Daily  . Chlorhexidine Gluconate Cloth  6 each Topical Daily  . [facility], Patient & Family Education   Does not apply Once  . docusate sodium  200 mg Oral Daily  . enoxaparin (LOVENOX) injection  30 mg Subcutaneous QHS  .  furosemide  40 mg Oral Daily  . mouth rinse  15 mL Mouth Rinse BID  . pantoprazole  40 mg Oral Daily  . potassium chloride  20 mEq Oral Daily  . potassium chloride  20 mEq Oral Q4H  . sodium chloride flush  3 mL Intravenous Q12H   Continuous Infusions: . sodium chloride Stopped (04/16/20 0934)  . amiodarone Stopped (04/17/20 1005)  . lactated ringers Stopped (04/14/20 1605)  . lactated ringers Stopped (04/14/20 1604)   PRN Meds:.albuterol, morphine injection, ondansetron (ZOFRAN) IV, oxyCODONE, sodium chloride flush, sodium chloride flush, traMADol  General appearance: alert, cooperative and no distress Neurologic: intact Heart: The DDD paced at 80 80 Lungs: clear to auscultation bilaterally Abdomen: soft, non-tender; bowel sounds normal; no masses,  no organomegaly Extremities: extremities normal, atraumatic, no cyanosis or edema and Homans sign is negative, no sign of DVT Wound: Sternum stable dressing intact  Lab Results: CBC: Recent Labs    04/16/20 0440 04/17/20 0452  WBC 10.6* 7.4  HGB 12.3* 11.0*  HCT 37.3* 32.7*  PLT 113* 117*   BMET:  Recent Labs    04/16/20 0440 04/17/20 0452  NA 132* 129*  K 4.0 3.6  CL 100 94*  CO2 23 26  GLUCOSE 108* 112*  BUN 9 12  CREATININE 1.03 0.87  CALCIUM 8.4* 8.0*    CMET:  Lab Results  Component Value Date   WBC 7.4 04/17/2020   HGB 11.0 (L) 04/17/2020   HCT 32.7 (L) 04/17/2020   PLT 117 (L) 04/17/2020   GLUCOSE 112 (H) 04/17/2020   CHOL 133 02/02/2020   TRIG 57 02/02/2020   HDL 69 02/02/2020   LDLCALC 52 02/02/2020   ALT 14 04/14/2020   AST 27 04/14/2020   NA 129 (L) 04/17/2020   K 3.6 04/17/2020   CL 94 (L) 04/17/2020   CREATININE 0.87 04/17/2020   BUN 12 04/17/2020   CO2 26 04/17/2020   INR 2.0 (H) 04/14/2020   HGBA1C 5.3 04/12/2020      PT/INR:  Recent Labs    04/14/20 1430  LABPROT 21.7*  INR 2.0*   Radiology: No results found.   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

## 2020-04-17 NOTE — Progress Notes (Signed)
Patient ID: Kurt Allen, male   DOB: [birth year], 74 y.o.   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 720   I.V. (mL/kg)    IV Piggyback    Total Intake(mL/kg) 720 (10.2)   Urine (mL/kg/hr) 650 (0.7)   Chest Tube 0   Total Output 650   Net +70         . sodium chloride Stopped (04/16/20 0934)  . amiodarone Stopped (04/17/20 1005)  . lactated ringers Stopped (04/14/20 1605)  . lactated ringers Stopped (04/14/20 1604)     Lab Results  Component Value Date   WBC 7.4 04/17/2020   HGB 11.0 (L) 04/17/2020   HCT 32.7 (L) 04/17/2020   PLT 117 (L) 04/17/2020   GLUCOSE 112 (H) 04/17/2020   CHOL 133 02/02/2020   TRIG 57 02/02/2020   HDL 69 02/02/2020   LDLCALC 52 02/02/2020   ALT 14 04/14/2020   AST 27 04/14/2020   NA 129 (L) 04/17/2020   K 3.6 04/17/2020   CL 94 (L) 04/17/2020   CREATININE 0.87 04/17/2020   BUN 12 04/17/2020   CO2 26 04/17/2020   INR 2.0 (H) 04/14/2020   HGBA1C 5.3 04/12/2020   ddd paced at 80 , no afib off cordrone with slow sinus this am   Grace Isaac MD  Beeper [phone] Office [phone] 04/17/2020 8:07 PM

## 2020-04-18 LAB — CBC
HCT: 29.3 % — ABNORMAL LOW (ref 39.0–52.0)
Hemoglobin: 9.9 g/dL — ABNORMAL LOW (ref 13.0–17.0)
MCH: 31.7 pg (ref 26.0–34.0)
MCHC: 33.8 g/dL (ref 30.0–36.0)
MCV: 93.9 fL (ref 80.0–100.0)
Platelets: 163 10*3/uL (ref 150–400)
RBC: 3.12 MIL/uL — ABNORMAL LOW (ref 4.22–5.81)
RDW: 13.4 % (ref 11.5–15.5)
WBC: 5 10*3/uL (ref 4.0–10.5)
nRBC: 0 % (ref 0.0–0.2)

## 2020-04-18 LAB — BASIC METABOLIC PANEL
Anion gap: 7 (ref 5–15)
BUN: 10 mg/dL (ref 8–23)
CO2: 25 mmol/L (ref 22–32)
Calcium: 6.9 mg/dL — ABNORMAL LOW (ref 8.9–10.3)
Chloride: 101 mmol/L (ref 98–111)
Creatinine, Ser: 0.71 mg/dL (ref 0.61–1.24)
GFR calc Af Amer: >60 mL/min (ref >60)
GFR calc non Af Amer: >60 mL/min (ref >60)
Glucose, Bld: 97 mg/dL (ref 70–99)
Potassium: 3.3 mmol/L — ABNORMAL LOW (ref 3.5–5.1)
Sodium: 133 mmol/L — ABNORMAL LOW (ref 135–145)

## 2020-04-18 MED ORDER — ONDANSETRON HCL 4 MG PO TABS: 4.0000 mg | ORAL_TABLET | Freq: Four times a day (QID) | ORAL | Status: DC | PRN

## 2020-04-18 MED ORDER — FUROSEMIDE 40 MG PO TABS: 40.0000 mg | ORAL_TABLET | Freq: Every day | ORAL | Status: DC

## 2020-04-18 MED ORDER — POTASSIUM CHLORIDE CRYS ER 20 MEQ PO TBCR
20.0000 meq | EXTENDED_RELEASE_TABLET | ORAL | Status: DC
  Administered 2020-04-18: 20 meq via ORAL
  Filled 2020-04-18: qty 1

## 2020-04-18 MED ORDER — SODIUM CHLORIDE 0.9% FLUSH
3.0000 mL | Freq: Two times a day (BID) | INTRAVENOUS | Status: DC
  Administered 2020-04-18 – 2020-04-20 (×6): 3 mL via INTRAVENOUS

## 2020-04-18 MED ORDER — OXYCODONE HCL 5 MG PO TABS: 5.0000 mg | ORAL_TABLET | ORAL | Status: DC | PRN

## 2020-04-18 MED ORDER — BISACODYL 10 MG RE SUPP: 10.0000 mg | Freq: Every day | RECTAL | Status: DC | PRN

## 2020-04-18 MED ORDER — ASPIRIN EC 325 MG PO TBEC
325.0000 mg | DELAYED_RELEASE_TABLET | Freq: Every day | ORAL | Status: DC
  Administered 2020-04-19: 325 mg via ORAL
  Filled 2020-04-18: qty 1

## 2020-04-18 MED ORDER — METOPROLOL TARTRATE 12.5 MG HALF TABLET
12.5000 mg | ORAL_TABLET | Freq: Two times a day (BID) | ORAL | Status: DC
  Administered 2020-04-18 – 2020-04-21 (×7): 12.5 mg via ORAL
  Filled 2020-04-18 (×7): qty 1

## 2020-04-18 MED ORDER — BISACODYL 5 MG PO TBEC: 10.0000 mg | DELAYED_RELEASE_TABLET | Freq: Every day | ORAL | Status: DC | PRN

## 2020-04-18 MED ORDER — ROSUVASTATIN CALCIUM 5 MG PO TABS
5.0000 mg | ORAL_TABLET | Freq: Every day | ORAL | Status: DC
  Administered 2020-04-18 – 2020-04-21 (×4): 5 mg via ORAL
  Filled 2020-04-18 (×4): qty 1

## 2020-04-18 MED ORDER — TRAMADOL HCL 50 MG PO TABS
50.0000 mg | ORAL_TABLET | ORAL | Status: DC | PRN
  Administered 2020-04-18 – 2020-04-21 (×2): 50 mg via ORAL
  Administered 2020-04-21: 100 mg via ORAL
  Filled 2020-04-18: qty 2
  Filled 2020-04-18 (×2): qty 1

## 2020-04-18 MED ORDER — ACETAMINOPHEN 325 MG PO TABS: 650.0000 mg | ORAL_TABLET | Freq: Four times a day (QID) | ORAL | Status: DC | PRN

## 2020-04-18 MED ORDER — INSULIN ASPART 100 UNIT/ML ~~LOC~~ SOLN: 0.0000 [IU] | Freq: Three times a day (TID) | SUBCUTANEOUS | Status: DC

## 2020-04-18 MED ORDER — POTASSIUM CHLORIDE CRYS ER 20 MEQ PO TBCR
20.0000 meq | EXTENDED_RELEASE_TABLET | Freq: Every day | ORAL | Status: DC
  Filled 2020-04-18: qty 1

## 2020-04-18 MED ORDER — DOCUSATE SODIUM 100 MG PO CAPS
200.0000 mg | ORAL_CAPSULE | Freq: Every day | ORAL | Status: DC
  Administered 2020-04-20 – 2020-04-21 (×2): 200 mg via ORAL
  Filled 2020-04-18 (×3): qty 2

## 2020-04-18 MED ORDER — SODIUM CHLORIDE 0.9% FLUSH: 3.0000 mL | INTRAVENOUS | Status: DC | PRN

## 2020-04-18 MED ORDER — ONDANSETRON HCL 4 MG/2ML IJ SOLN: 4.0000 mg | Freq: Four times a day (QID) | INTRAMUSCULAR | Status: DC | PRN

## 2020-04-18 MED ORDER — ~~LOC~~ CARDIAC SURGERY, PATIENT & FAMILY EDUCATION: Freq: Once | Status: AC

## 2020-04-18 MED ORDER — SODIUM CHLORIDE 0.9 % IV SOLN: 250.0000 mL | INTRAVENOUS | Status: DC | PRN

## 2020-04-18 MED ORDER — TAMSULOSIN HCL 0.4 MG PO CAPS
0.4000 mg | ORAL_CAPSULE | Freq: Every day | ORAL | Status: DC
  Administered 2020-04-18 – 2020-04-21 (×4): 0.4 mg via ORAL
  Filled 2020-04-18 (×4): qty 1

## 2020-04-18 MED ORDER — GUAIFENESIN ER 600 MG PO TB12: 600.0000 mg | ORAL_TABLET | Freq: Two times a day (BID) | ORAL | Status: DC | PRN

## 2020-04-18 NOTE — Progress Notes (Signed)
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 25MM AORTIC VALVED CONDUIT WITH REIMPLANTATION OF RIGHT AND LEFT CORONARY BUTTONS (N/A) TRANSESOPHAGEAL ECHOCARDIOGRAM (TEE) (N/A) AORTIC VALVE REPLACEMENT (AVR) (N/A)  Total Length of Stay:  LOS: 4 days   Subjective: Patient awake alert neurologically intact sitting in chair feeling comfortable no complaints  Currently he is paced DDD at 80, with pacer off is in sinus rhythm, no pauses 65-70  We will convert pacer to VVI backup 5 54  Objective: Vital signs in last 24 hours: Temp:  [97.6 F (36.4 C)-98.2 F (36.8 C)] 97.9 F (36.6 C) (08/29 0747) Pulse Rate:  [76-84] 82 (08/29 0815) Cardiac Rhythm: A-V Sequential paced (08/29 0815) Resp:  [13-22] 18 (08/29 0815) BP: (94-143)/(58-86) 136/75 (08/29 0600) SpO2:  [93 %-100 %] 96 % (08/29 0815) Weight:  [71 kg] 71 kg (08/29 0600)  Filed Weights   04/16/20 0500 04/17/20 0600 04/18/20 0600  Weight: 71.6 kg 70.8 kg 71 kg    Weight change: 0.2 kg   Hemodynamic parameters for last 24 hours:    Intake/Output from previous day: 08/28 0701 - 08/29 0700 In: 1119 [P.O.:960; I.V.:159] Out: 1350 [Urine:1350]  Intake/Output this shift: Total I/O In: 240 [P.O.:240] Out: -   Current Meds: Scheduled Meds: . acetaminophen  1,000 mg Oral Q6H  . aspirin EC  325 mg Oral Daily  . bisacodyl  10 mg Oral Daily   Or  . bisacodyl  10 mg Rectal Daily  . Chlorhexidine Gluconate Cloth  6 each Topical Daily  . [facility], Patient & Family Education   Does not apply Once  . docusate sodium  200 mg Oral Daily  . enoxaparin (LOVENOX) injection  30 mg Subcutaneous QHS  . furosemide  40 mg Oral Daily  . mouth rinse  15 mL Mouth Rinse BID  . pantoprazole  40 mg  Oral Daily  . potassium chloride  20 mEq Oral Daily  . potassium chloride  20 mEq Oral Q4H  . sodium chloride flush  3 mL Intravenous Q12H   Continuous Infusions: . sodium chloride Stopped (04/16/20 0934)  . amiodarone Stopped (04/17/20 0956)  . lactated ringers Stopped (04/14/20 1605)  . lactated ringers Stopped (04/14/20 1604)   PRN Meds:.albuterol, morphine injection, ondansetron (ZOFRAN) IV, oxyCODONE, sodium chloride flush, sodium chloride flush, traMADol  General appearance: alert, cooperative and no distress Neurologic: intact Heart: regular rate and rhythm, S1, S2 normal, no murmur, click, rub or gallop Lungs: clear to auscultation bilaterally Abdomen: soft, non-tender; bowel sounds normal; no masses,  no organomegaly Extremities: extremities normal, atraumatic, no cyanosis or edema  Lab Results: CBC: Recent Labs    04/17/20 0452 04/18/20 0447  WBC 7.4 5.0  HGB 11.0* 9.9*  HCT 32.7* 29.3*  PLT 117* 163   BMET:  Recent Labs    04/17/20 0452 04/18/20 0447  NA 129* 133*  K 3.6 3.3*  CL 94* 101  CO2 26 25  GLUCOSE 112* 97  BUN 12 10  CREATININE 0.87 0.71  CALCIUM 8.0* 6.9*    CMET: Lab Results  Component Value Date   WBC 5.0 04/18/2020  HGB 9.9 (L) 04/18/2020   HCT 29.3 (L) 04/18/2020   PLT 163 04/18/2020   GLUCOSE 97 04/18/2020   CHOL 133 02/02/2020   TRIG 57 02/02/2020   HDL 69 02/02/2020   LDLCALC 52 02/02/2020   ALT 14 04/14/2020   AST 27 04/14/2020   NA 133 (L) 04/18/2020   K 3.3 (L) 04/18/2020   CL 101 04/18/2020   CREATININE 0.71 04/18/2020   BUN 10 04/18/2020   CO2 25 04/18/2020   INR 2.0 (H) 04/14/2020   HGBA1C 5.3 04/12/2020      PT/INR: No results for input(s): LABPROT, INR in the last 72 hours. Radiology: No results found.   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 Past the radiologist waited to go to get him into the place is closed

## 2020-04-19 LAB — BASIC METABOLIC PANEL
Anion gap: 11 (ref 5–15)
BUN: 11 mg/dL (ref 8–23)
CO2: 27 mmol/L (ref 22–32)
Calcium: 8.4 mg/dL — ABNORMAL LOW (ref 8.9–10.3)
Chloride: 90 mmol/L — ABNORMAL LOW (ref 98–111)
Creatinine, Ser: 0.93 mg/dL (ref 0.61–1.24)
GFR calc Af Amer: >60 mL/min (ref >60)
GFR calc non Af Amer: >60 mL/min (ref >60)
Glucose, Bld: 109 mg/dL — ABNORMAL HIGH (ref 70–99)
Potassium: 3.9 mmol/L (ref 3.5–5.1)
Sodium: 128 mmol/L — ABNORMAL LOW (ref 135–145)

## 2020-04-19 LAB — CBC
HCT: 31.2 % — ABNORMAL LOW (ref 39.0–52.0)
Hemoglobin: 10.4 g/dL — ABNORMAL LOW (ref 13.0–17.0)
MCH: 30.6 pg (ref 26.0–34.0)
MCHC: 33.3 g/dL (ref 30.0–36.0)
MCV: 91.8 fL (ref 80.0–100.0)
Platelets: 196 10*3/uL (ref 150–400)
RBC: 3.4 MIL/uL — ABNORMAL LOW (ref 4.22–5.81)
RDW: 13.3 % (ref 11.5–15.5)
WBC: 4.8 10*3/uL (ref 4.0–10.5)
nRBC: 0 % (ref 0.0–0.2)

## 2020-04-19 MED ORDER — FUROSEMIDE 10 MG/ML IJ SOLN
40.0000 mg | Freq: Once | INTRAMUSCULAR | Status: AC
  Administered 2020-04-19: 40 mg via INTRAVENOUS
  Filled 2020-04-19: qty 4

## 2020-04-19 MED ORDER — FUROSEMIDE 40 MG PO TABS: 40.0000 mg | ORAL_TABLET | Freq: Two times a day (BID) | ORAL | Status: DC

## 2020-04-19 MED ORDER — WARFARIN - PHYSICIAN DOSING INPATIENT: Freq: Every day | Status: DC

## 2020-04-19 MED ORDER — WARFARIN SODIUM 5 MG PO TABS
5.0000 mg | ORAL_TABLET | Freq: Every day | ORAL | Status: AC
  Administered 2020-04-19: 5 mg via ORAL
  Filled 2020-04-19: qty 1

## 2020-04-19 MED ORDER — FUROSEMIDE 40 MG PO TABS
40.0000 mg | ORAL_TABLET | Freq: Every day | ORAL | Status: DC
  Administered 2020-04-20 – 2020-04-21 (×2): 40 mg via ORAL
  Filled 2020-04-19 (×2): qty 1

## 2020-04-19 MED ORDER — POTASSIUM CHLORIDE CRYS ER 20 MEQ PO TBCR
40.0000 meq | EXTENDED_RELEASE_TABLET | Freq: Once | ORAL | Status: AC
  Administered 2020-04-19: 40 meq via ORAL
  Filled 2020-04-19: qty 2

## 2020-04-19 MED ORDER — ASPIRIN EC 81 MG PO TBEC
81.0000 mg | DELAYED_RELEASE_TABLET | Freq: Every day | ORAL | Status: DC
  Administered 2020-04-20 – 2020-04-21 (×2): 81 mg via ORAL
  Filled 2020-04-19 (×2): qty 1

## 2020-04-19 MED FILL — Potassium Chloride Inj 2 mEq/ML: INTRAVENOUS | Qty: 40 | Status: AC

## 2020-04-19 MED FILL — Lidocaine HCl Local Preservative Free (PF) Inj 2%: INTRAMUSCULAR | Qty: 15 | Status: AC

## 2020-04-19 MED FILL — Heparin Sodium (Porcine) Inj 1000 Unit/ML: INTRAMUSCULAR | Qty: 30 | Status: AC

## 2020-04-19 MED FILL — Magnesium Sulfate Inj 50%: INTRAMUSCULAR | Qty: 10 | Status: CN

## 2020-04-19 NOTE — Progress Notes (Signed)
Mobility Specialist - Progress Note   04/19/20 1435  Mobility  Activity Ambulated in hall  Level of Assistance Modified independent, requires aide device or extra time  Assistive Device Front wheel walker  Distance Ambulated (ft) 700 ft  Mobility Response Tolerated well  Mobility performed by Mobility specialist  $Mobility charge 1 Mobility    Pre-mobility: 98 HR, 100% SpO2 During mobility: 108 HR Post-mobility: 92 HR  Pt able to adhere to sternal precautions in order to get in/out of bed w/o assistance. He was asx throughout ambulation, unable to get signal on pulse oximeter.   Pricilla Handler Mobility Specialist Mobility Specialist Phone: [phone]

## 2020-04-19 NOTE — Progress Notes (Addendum)
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 No complaints.   Objective: Vital signs in last 24 hours: Temp:  [97.6 F (36.4 C)-98.5 F (36.9 C)] 98.5 F (36.9 C) (08/29 2338) Pulse Rate:  [64-82] 76 (08/30 0700) Cardiac Rhythm: Normal sinus rhythm (08/30 0801) Resp:  [14-20] 18 (08/30 0347) BP: (103-144)/(64-87) 116/68 (08/30 0347) SpO2:  [90 %-99 %] 90 % (08/30 0700) Weight:  [72 kg] 72 kg (08/30 0700)     Intake/Output from previous day: 08/29 0701 - 08/30 0700 In: 240 [P.O.:240] Out: -  Intake/Output this shift: No intake/output data recorded.  General appearance: alert, cooperative and no distress Heart: regular rate and rhythm, S1, S2 normal, no murmur, click, rub or gallop Lungs: clear to auscultation bilaterally Abdomen: soft, non-tender; bowel sounds normal; no masses,  no organomegaly Extremities: extremities normal, atraumatic, no cyanosis or edema Wound: clean and dry  Lab Results: Recent Labs    04/18/20 0447 04/19/20 0158  WBC 5.0 4.8  HGB 9.9* 10.4*  HCT 29.3* 31.2*  PLT 163 196   BMET:  Recent Labs    04/18/20 0447 04/19/20 0158  NA 133* 128*  K 3.3* 3.9  CL 101 90*  CO2 25 27  GLUCOSE 97 109*  BUN 10 11  CREATININE 0.71 0.93  CALCIUM 6.9* 8.4*    PT/INR: No results for input(s): LABPROT, INR in the last 72 hours. 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 CV-BP well controlled, NSR in the 70s, on VVI backup rate of 54. Continue asa, statin, and bb 2. Pulm-tolerating room air with good oxygen saturation, last CXR shows suspected small right pleural effusion. Left base atelectasis.  3. Renal-creatinine 0.93, electrolytes okay, continue lasix for fluid overload 4. H and H 10.4/31.2, stable acute blood loss anemia 5. Blood glucose well controlled, CBGs stopped. 6. GI- + BM   Plan: He has been NSR overnight without any afib or bradycardia. I turned his pacer box off and will have nursing roll and tape his wires to his chest. Will plan to pull his EPW tomorrow if his rhythm remains stable. Continue ambulation in the halls today. Continue to encourage incentive spirometer use.    LOS: 5 days    Elgie Collard 04/19/2020   I have seen and examined the patient and agree with the assessment and plan as outlined.  Looks good.  Maintaining NSR w/ stable BP.  Weight still >> preop but clinically doesn't look very volume overloaded.  Hyponatremia w/ sudden drop in sodium last 24  hours - I'm skeptical that's accurate and suspect free water excess.  Will give lasix IV today and recheck BMET tomorrow.  D/C pacing wires.  Possible D/C home tomorrow.  Rexene Alberts, MD 04/19/2020 8:32 AM

## 2020-04-19 NOTE — Plan of Care (Signed)
Poc progressing.  

## 2020-04-19 NOTE — Progress Notes (Signed)
Pt noted to go into an atrial fibrillation rhythm with a controlled rate approximately 0937.  PA made aware.  Will continue to monitor.

## 2020-04-19 NOTE — Progress Notes (Signed)
CARDIAC REHAB PHASE I   PRE:  Rate/Rhythm: 82 Afib  BP:  Sitting: 87/54      SaO2: 93 RA  MODE:  Ambulation: 200 ft   POST:  Rate/Rhythm: 102 Afib  BP:  Sitting: 97/66    SaO2: 93 RA  Pt ambulated 215ft in hallway standby assist with front wheel walker. Pt denies dizziness, pain, or SOB. Pt returned to recliner. Encouraged continued IS use and walks. Will continue to follow.  [id number] Rufina Falco, RN BSN 04/19/2020 11:15 AM

## 2020-04-19 NOTE — Progress Notes (Signed)
Epicardial pacing wires removed per MD order without difficulty.  Vitals signs being cycled and pt educated on one hour of bedrest.  Will continue to monitor.

## 2020-04-20 ENCOUNTER — Ambulatory Visit: Payer: Medicare HMO | Admitting: Cardiology

## 2020-04-20 LAB — BASIC METABOLIC PANEL
Anion gap: 11 (ref 5–15)
BUN: 12 mg/dL (ref 8–23)
CO2: 29 mmol/L (ref 22–32)
Calcium: 8.5 mg/dL — ABNORMAL LOW (ref 8.9–10.3)
Chloride: 92 mmol/L — ABNORMAL LOW (ref 98–111)
Creatinine, Ser: 1.03 mg/dL (ref 0.61–1.24)
GFR calc Af Amer: >60 mL/min (ref >60)
GFR calc non Af Amer: >60 mL/min (ref >60)
Glucose, Bld: 113 mg/dL — ABNORMAL HIGH (ref 70–99)
Potassium: 3.7 mmol/L (ref 3.5–5.1)
Sodium: 132 mmol/L — ABNORMAL LOW (ref 135–145)

## 2020-04-20 LAB — PROTIME-INR
INR: 1.1 (ref 0.8–1.2)
Prothrombin Time: 13.9 seconds (ref 11.4–15.2)

## 2020-04-20 MED ORDER — POTASSIUM CHLORIDE CRYS ER 20 MEQ PO TBCR
40.0000 meq | EXTENDED_RELEASE_TABLET | Freq: Once | ORAL | Status: AC
  Administered 2020-04-20: 40 meq via ORAL
  Filled 2020-04-20: qty 2

## 2020-04-20 MED ORDER — MAGNESIUM OXIDE 400 (241.3 MG) MG PO TABS
400.0000 mg | ORAL_TABLET | Freq: Two times a day (BID) | ORAL | Status: DC
  Administered 2020-04-20 – 2020-04-21 (×3): 400 mg via ORAL
  Filled 2020-04-20 (×3): qty 1

## 2020-04-20 MED ORDER — COUMADIN BOOK
Freq: Once | Status: AC
  Administered 2020-04-20: 1
  Filled 2020-04-20: qty 1

## 2020-04-20 MED ORDER — WARFARIN SODIUM 2.5 MG PO TABS
2.5000 mg | ORAL_TABLET | Freq: Every day | ORAL | Status: AC
  Administered 2020-04-20: 2.5 mg via ORAL
  Filled 2020-04-20: qty 1

## 2020-04-20 MED FILL — Heparin Sodium (Porcine) Inj 1000 Unit/ML: INTRAMUSCULAR | Qty: 10 | Status: AC

## 2020-04-20 MED FILL — Mannitol IV Soln 20%: INTRAVENOUS | Qty: 500 | Status: AC

## 2020-04-20 MED FILL — Sodium Chloride IV Soln 0.9%: INTRAVENOUS | Qty: 3000 | Status: AC

## 2020-04-20 MED FILL — Albumin, Human Inj 5%: INTRAVENOUS | Qty: 250 | Status: AC

## 2020-04-20 MED FILL — Electrolyte-R (PH 7.4) Solution: INTRAVENOUS | Qty: 4000 | Status: AC

## 2020-04-20 MED FILL — Sodium Bicarbonate IV Soln 8.4%: INTRAVENOUS | Qty: 50 | Status: AC

## 2020-04-20 NOTE — Progress Notes (Signed)
Transitions of Care Pharmacist Note  Kurt Allen is a 74 y.o. male that has been diagnosed with aortic valve replacement and will be prescribed Coumadin (warfarin)  at discharge.   Patient Education: I provided the following education on 04/20/20 to the patient: How to take the medication Described what the medication is Signs of bleeding Signs/symptoms of VTE and stroke  Answered their questions Dietary and medication precautions with warfarin   Discharge Medications Plan: The patient is not interested in filling their discharge medications with the Transitions of Care pharmacy at this time.   If the patient later decides they would like to have the Transitions of Care pharmacy fill their discharge medications, please call us at [phone]. Thank you.    Thank you,   Dimple Nanas, PharmD PGY-1 Acute Care Pharmacy Resident 04/20/2020 6:36 PM

## 2020-04-20 NOTE — Progress Notes (Signed)
Mobility Specialist: Progress Note    04/20/20 1440  Mobility  Activity Ambulated in hall  Level of Assistance Independent  Assistive Device Front wheel walker  Distance Ambulated (ft) 590 ft  Mobility Response Tolerated well  Mobility performed by Mobility specialist  Bed Position Semi-fowlers  $Mobility charge 1 Mobility   Pre-Mobility: 81 HR, 97/61 BP, 92% SpO2 Post-Mobility: 82 HR, 118/79 BP, 99% SpO2  Pt tolerated ambulation well. Reviewed sternal precautions w/ pt.   Tatem Holsonback Mobility Specialist\

## 2020-04-20 NOTE — Discharge Instructions (Signed)
[facility] office [phone]  Surgical Aortic Valve Replacement, Care After This sheet gives you information about how to care for yourself after your procedure. Your health care provider may also give you more specific instructions. If you have problems or questions, contact your health care provider. What can I expect after the procedure? After the procedure, it is common to have pain around your incision area. Follow these instructions at home: Medicines  Take over-the-counter and prescription medicines only as told by your health care provider.  If you were prescribed an antibiotic medicine, take it as told by your health care provider. Do not stop taking the antibiotic even if you start to feel better.  If you have a mechanical prosthesis, you may be given a blood thinner called warfarin. Follow instructions carefully on how to take this medicine.  Ask your health care provider if the medicine prescribed to you: ? Requires you to avoid driving or using heavy machinery. ? Can cause constipation. You may need to take actions to prevent or treat constipation, such as:  Take over-the-counter or prescription medicines.  Eat foods that are high in fiber, such as beans, whole grains, and fresh fruits and vegetables.  Limit foods that are high in fat and processed sugars, such as fried or sweet foods. Eating and drinking      Limit how much caffeine you drink. Caffeine can affect your heart's rate and rhythm.  Do not drink alcohol if: ? Your health care provider tells you not to drink. ? You are pregnant, may be pregnant, or are planning to become pregnant.  Drink enough fluid to keep your urine pale yellow.  Eat a heart-healthy diet that includes fruits, vegetables, whole grains, low-fat dairy products, and lean proteins like poultry and eggs. Incision care   Follow instructions from your health care provider about how to take care of your incision. Make sure you: ? Wash your  hands with soap and water before and after you change your bandage (dressing). If soap and water are not available, use hand sanitizer. ? Change your dressing as told by your health care provider. ? Leave stitches (sutures), skin glue, or adhesive strips in place. These skin closures may need to stay in place for 2 weeks or longer. If adhesive strip edges start to loosen and curl up, you may trim the loose edges. Do not remove adhesive strips completely unless your health care provider tells you to do that.  Check your incision area every day for signs of infection. Check for: ? Redness, swelling, or increasing pain. ? Fluid or blood. ? Warmth. ? Pus or a bad smell. Activity  Return to your normal activities as told by your health care provider. Most patients will need to limit any lifting or strenuous activity for 4-6 weeks.  Avoid sitting for a long time without moving. Get up to take short walks every 1-2 hours.  Do exercises as told by your health care provider.  Do not lift anything that is heavier than 10 lb (4.5 kg), or the limit that you are told, until your health care provider says that it is safe.  Avoid pushing or pulling things with your arms until your health care provider approves. This includes pulling on handrails to help you climb stairs. Lifestyle  Do not use any products that contain nicotine or tobacco, such as cigarettes, e-cigarettes, and chewing tobacco. These can delay incision healing after surgery. If you need help quitting, ask your health care provider.  Resume sexual activity as told by your health care provider. If you have erectile dysfunction, do not use medicines to treat this condition unless your health care provider approves.  Work with your health care provider to: ? Keep your blood pressure and cholesterol under control. ? Manage any other heart conditions that you have. ? Maintain a healthy weight. Driving and travel  Do not drive until your  health care provider approves. Ask your health care provider when it is safe for you to drive.  Avoid airplane travel for as long as told by your health care provider.  When you travel, bring a list of your medicines and a record of your medical history with you. Carry your medicines with you. General instructions  Do not take baths, swim, or use a hot tub until your health care provider approves. . You may shower  Do not strain to have a bowel movement.  Avoid crossing your legs while sitting down.  Check your temperature every day for a fever. A fever may be a sign of infection.  If you are a woman and you plan to become pregnant, talk with your health care provider before you become pregnant.  Wear compression stockings as told by your health care provider. These stockings help to prevent blood clots and reduce swelling in your legs.  Tell all health care providers who care for you that you have an artificial (prosthetic) aortic valve. Also, tell them if you have or have had heart disease or endocarditis.  You will be given a card at discharge. The card indicates the type of prosthetic valve that you have. Keep this card in your wallet or purse for quick reference in case of an emergency.  Keep all follow-up visits as told by your health care provider. This is important. Contact a health care provider if:  You develop a skin rash.  You experience sudden, unexplained changes in your weight.  You have redness, swelling, or increasing pain around your incision.  You have fluid or blood coming from your incision.  Your incision feels warm to the touch.  You have pus or a bad smell coming from your incision.  You have a fever. Get help right away if you:  Develop chest pain that is different from the pain coming from your incision.  Develop shortness of breath or difficulty breathing.  Start to feel light-headed. These symptoms may represent a serious problem that is an  emergency. Do not wait to see if the symptoms will go away. Get medical help right away. Call your local emergency services (911 in the [place] Do not drive yourself to the hospital. Summary  After this procedure, it is common to have pain in the incision area.  Eat a heart-healthy diet. Follow instructions about alcohol use.  Get up to walk often. Avoid pushing and pulling with your arms. Ask what activities are safe for you.  Care for your incision as told by your health care provider. Check it daily for signs of infection.  Get help right away if you have chest pain that is different from your incisions, develop shortness of breath or difficulty breathing, or start to feel light-headed. This information is not intended to replace advice given to you by your health care provider. Make sure you discuss any questions you have with your health care provider. Document Revised: 05/02/2018 Document Reviewed: 05/02/2018 Elsevier Patient Education  2020 [employer] on my medicine - Coumadin   (Warfarin)  Why  was Coumadin prescribed for you? Coumadin was prescribed for you because you have a blood clot or a medical condition that can cause an increased risk of forming blood clots. Blood clots can cause serious health problems by blocking the flow of blood to the heart, lung, or brain. Coumadin can prevent harmful blood clots from forming.  What test will check on my response to Coumadin? While on Coumadin (warfarin) you will need to have an INR test regularly to ensure that your dose is keeping you in the desired range. The INR (international normalized ratio) number is calculated from the result of the laboratory test called prothrombin time (PT).  If an INR APPOINTMENT HAS NOT ALREADY BEEN MADE FOR YOU please schedule an appointment to have this lab work done by your health care provider within 7 days. Your INR goal is usually a number between:  2 to 3 or your provider may give  you a more narrow range like 2-2.5.  Ask your health care provider during an office visit what your goal INR is.  What  do you need to  know  About  COUMADIN? Take Coumadin (warfarin) exactly as prescribed by your healthcare provider about the same time each day.  DO NOT stop taking without talking to the doctor who prescribed the medication.  Stopping without other blood clot prevention medication to take the place of Coumadin may increase your risk of developing a new clot or stroke.  Get refills before you run out.  What do you do if you miss a dose? If you miss a dose, take it as soon as you remember on the same day then continue your regularly scheduled regimen the next day.  Do not take two doses of Coumadin at the same time.  Important Safety Information A possible side effect of Coumadin (Warfarin) is an increased risk of bleeding. You should call your healthcare provider right away if you experience any of the following: ? Bleeding from an injury or your nose that does not stop. ? Unusual colored urine (red or dark brown) or unusual colored stools (red or black). ? Unusual bruising for unknown reasons. ? A serious fall or if you hit your head (even if there is no bleeding).  Some foods or medicines interact with Coumadin (warfarin) and might alter your response to warfarin. To help avoid this: ? Eat a balanced diet, maintaining a consistent amount of Vitamin K. ? Notify your provider about major diet changes you plan to make. ? Avoid alcohol or limit your intake to 1 drink for women and 2 drinks for men per day. (1 drink is 5 oz. wine, 12 oz. beer, or 1.5 oz. liquor.)  Make sure that ANY health care provider who prescribes medication for you knows that you are taking Coumadin (warfarin).  Also make sure the healthcare provider who is monitoring your Coumadin knows when you have started a new medication including herbals and non-prescription products.  Coumadin (Warfarin)  Major Drug  Interactions  Increased Warfarin Effect Decreased Warfarin Effect  Alcohol (large quantities) Antibiotics (esp. Septra/Bactrim, Flagyl, Cipro) Amiodarone (Cordarone) Aspirin (ASA) Cimetidine (Tagamet) Megestrol (Megace) NSAIDs (ibuprofen, naproxen, etc.) Piroxicam (Feldene) Propafenone (Rythmol SR) Propranolol (Inderal) Isoniazid (INH) Posaconazole (Noxafil) Barbiturates (Phenobarbital) Carbamazepine (Tegretol) Chlordiazepoxide (Librium) Cholestyramine (Questran) Griseofulvin Oral Contraceptives Rifampin Sucralfate (Carafate) Vitamin K   Coumadin (Warfarin) Major Herbal Interactions  Increased Warfarin Effect Decreased Warfarin Effect  Garlic Ginseng Ginkgo biloba Coenzyme Q10 Green tea St. John's wort    Coumadin (Warfarin) FOOD  Interactions  Eat a consistent number of servings per week of foods HIGH in Vitamin K (1 serving =  cup)  Collards (cooked, or boiled & drained) Kale (cooked, or boiled & drained) Mustard greens (cooked, or boiled & drained) Parsley *serving size only =  cup Spinach (cooked, or boiled & drained) Swiss chard (cooked, or boiled & drained) Turnip greens (cooked, or boiled & drained)  Eat a consistent number of servings per week of foods MEDIUM-HIGH in Vitamin K (1 serving = 1 cup)  Asparagus (cooked, or boiled & drained) Broccoli (cooked, boiled & drained, or raw & chopped) Brussel sprouts (cooked, or boiled & drained) *serving size only =  cup Lettuce, raw (green leaf, endive, romaine) Spinach, raw Turnip greens, raw & chopped   These websites have more information on Coumadin (warfarin):  FailFactory.se; VeganReport.com.au;

## 2020-04-20 NOTE — Progress Notes (Addendum)
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2 kg] 71.2 kg (08/31 0356)  Hemodynamic parameters for last 24 hours:    Intake/Output from previous day: 08/30 0701 - 08/31 0700 In: 720 [P.O.:720] Out: 400 [Urine:400] Intake/Output this shift: No intake/output data recorded.  General appearance: alert, cooperative and no distress Heart: regular rate and rhythm Lungs: mildly dim in bases Abdomen: benign Extremities: no edema Wound: incis healing well  Lab Results: Recent Labs    04/18/20 0447 04/19/20 0158  WBC 5.0 4.8  HGB 9.9* 10.4*  HCT 29.3* 31.2*  PLT 163 196   BMET:  Recent Labs    04/19/20 0158 04/20/20 0140  NA 128* 132*  K 3.9 3.7  CL 90* 92*  CO2 27 29  GLUCOSE 109* 113*  BUN 11 12  CREATININE 0.93 1.03  CALCIUM 8.4* 8.5*    PT/INR:  Recent Labs    04/20/20 0140  LABPROT 13.9  INR 1.1   ABG    Component Value Date/Time   PHART 7.322 (L) 04/14/2020 1936   HCO3 22.7 04/14/2020 1936   TCO2 24 04/14/2020 1936   ACIDBASEDEF 3.0 (H) 04/14/2020 1936   O2SAT 99.0 04/14/2020 1936   CBG (last 3)  No results for input(s): GLUCAP in the last 72  hours.  Meds Scheduled Meds: . aspirin EC  81 mg Oral Daily  . docusate sodium  200 mg Oral Daily  . enoxaparin (LOVENOX) injection  30 mg Subcutaneous QHS  . furosemide  40 mg Oral Daily  . mouth rinse  15 mL Mouth Rinse BID  . metoprolol tartrate  12.5 mg Oral BID  . potassium chloride  20 mEq Oral Daily  . rosuvastatin  5 mg Oral Daily  . sodium chloride flush  3 mL Intravenous Q12H  . tamsulosin  0.4 mg Oral Daily  . Warfarin - Physician Dosing Inpatient   Does not apply q1600   Continuous Infusions: . sodium chloride     PRN Meds:.sodium chloride, acetaminophen, albuterol, bisacodyl **OR** bisacodyl, guaiFENesin, ondansetron **OR** ondansetron (ZOFRAN) IV, oxyCODONE, sodium chloride flush, traMADol  Xrays No results found.  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 25MM AORTIC VALVED CONDUIT WITH REIMPLANTATION OF RIGHT AND LEFT CORONARY BUTTONS (N/A) TRANSESOPHAGEAL ECHOCARDIOGRAM (TEE) (N/A) AORTIC VALVE REPLACEMENT (AVR) (N/A)  1 overall doing well 2 afib, now sinus- not on amio with bradycardia when on gtt, on low dose metoprolol(12.5 BID)- rate into 120's yesterday evening, will d/w MD 3 now on coumadin, INR 1.1 4 sodium improved 5 replace K+, Mg++ on 8/28 was 1.8- will add 6 approaching euvolemia- may  be able to stop lasix soon 7 cont rehab/pulm toilet 8 disposition pending    LOS: 6 days    John Giovanni PA-C Pager [phone] 04/20/2020   I have seen and examined the patient and agree with the assessment and plan as outlined.  Brief episode rate-controlled Afib yesterday, maintaining NSR today.  Tentatively plan d/c home tomorrow if rhythm stable.  Plan warfarin x 2-3 months.  Rexene Alberts, MD 04/20/2020

## 2020-04-20 NOTE — Progress Notes (Signed)
CARDIAC REHAB PHASE I   PRE:  Rate/Rhythm: 71 SR  BP:  Sitting: 104/75      SaO2: 96 RA  MODE:  Ambulation: 470 ft   POST:  Rate/Rhythm: 82 SR  BP:  Sitting: 129/68    SaO2: 96 RA  Pt ambulated 49ft in hallway standby assist with front wheel walker. Pt denies CP, SOB, or dizziness. Pt able to increase distance, HR maintained. Encouraged continued ambulation and IS use. Will continue to follow.  [id number] Rufina Falco, RN BSN 04/20/2020 10:33 AM

## 2020-04-21 LAB — PROTIME-INR
INR: 1.2 (ref 0.8–1.2)
Prothrombin Time: 14.5 seconds (ref 11.4–15.2)

## 2020-04-21 MED ORDER — TRAMADOL HCL 50 MG PO TABS
50.0000 mg | ORAL_TABLET | Freq: Four times a day (QID) | ORAL | 0 refills | Status: DC | PRN
Start: 2020-04-21 — End: 2020-07-19

## 2020-04-21 MED ORDER — ACETAMINOPHEN 325 MG PO TABS: 650.0000 mg | ORAL_TABLET | Freq: Four times a day (QID) | ORAL | Status: AC | PRN

## 2020-04-21 MED ORDER — WARFARIN SODIUM 2.5 MG PO TABS: 2.5000 mg | ORAL_TABLET | Freq: Every day | ORAL | Status: DC

## 2020-04-21 MED ORDER — MAGNESIUM OXIDE 400 (241.3 MG) MG PO TABS: 400.0000 mg | ORAL_TABLET | Freq: Two times a day (BID) | ORAL | 0 refills | Status: AC

## 2020-04-21 MED ORDER — METOPROLOL TARTRATE 25 MG PO TABS
12.5000 mg | ORAL_TABLET | Freq: Two times a day (BID) | ORAL | 1 refills | Status: DC
Start: 2020-04-21 — End: 2020-05-14

## 2020-04-21 MED ORDER — WARFARIN SODIUM 2.5 MG PO TABS
2.5000 mg | ORAL_TABLET | Freq: Every day | ORAL | 1 refills | Status: DC
Start: 2020-04-21 — End: 2020-06-03

## 2020-04-21 MED ORDER — ASPIRIN 81 MG PO TBEC: 81.0000 mg | DELAYED_RELEASE_TABLET | Freq: Every day | ORAL | Status: AC

## 2020-04-21 NOTE — Progress Notes (Signed)
CARDIAC REHAB PHASE I   D/c education completed with pt. Pt educated on importance of site care and monitoring incision daily. Encouraged continued IS use, walks, and sternal precautions. Pt given in-the-tube sheet along with heart healthy diet. Pt given smoking cessation tip sheet, states he is motivated to quit. Support and encouragement provided. Reviewed restrictions and exercise guidelines. Will refer to CRP II [place]. Pt is interested in participating in Virtual Cardiac and Pulmonary Rehab. Pt advised that Virtual Cardiac and Pulmonary Rehab is provided at no cost to the patient.  Checklist:  1. Pt has smart device  ie smartphone and/or ipad for downloading an app  Yes 2. Reliable internet/wifi service    Yes 3. Understands how to use their smartphone and navigate within an app.  Yes  Pt verbalized understanding and is in agreement.  [phone] Rufina Falco, RN BSN 04/21/2020 8:42 AM

## 2020-04-21 NOTE — Progress Notes (Signed)
Discharge orders received.  Cardiac rehab education completed.  IV removed, telemetry discontinued.  CCMD notified.  CT sutures removed as per orders, covered with steri-strips.  Discharge education reviewed, pt expresses understanding.  Pt daughter will be picking up and has been notified, she will notify pt upon arrival.

## 2020-04-21 NOTE — Progress Notes (Addendum)
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 25MM AORTIC VALVED CONDUIT WITH REIMPLANTATION OF RIGHT AND LEFT CORONARY BUTTONS (N/A) TRANSESOPHAGEAL ECHOCARDIOGRAM (TEE) (N/A) AORTIC VALVE REPLACEMENT (AVR) (N/A) Subjective: Feels well  Objective: Vital signs in last 24 hours: Temp:  [97.4 F (36.3 C)-98.4 F (36.9 C)] 98.3 F (36.8 C) (09/01 0510) Pulse Rate:  [72-90] 76 (09/01 0558) Cardiac Rhythm: Normal sinus rhythm (09/01 0201) Resp:  [16-20] 20 (09/01 0510) BP: (100-129)/(61-80) 112/73 (09/01 0510) SpO2:  [90 %-96 %] 92 % (09/01 0558) Weight:  [69.7 kg] 69.7 kg (09/01 0558)  Hemodynamic parameters for last 24 hours:    Intake/Output from previous day: 08/31 0701 - 09/01 0700 In: 363 [P.O.:360; I.V.:3] Out: 375 [Urine:375] Intake/Output this shift: No intake/output data recorded.  General appearance: alert, cooperative and no distress Heart: regular rate and rhythm Lungs: clear to auscultation bilaterally Abdomen: benign Extremities: no edema Wound: incis healing well  Lab Results: Recent Labs    04/19/20 0158  WBC 4.8  HGB 10.4*  HCT 31.2*  PLT 196   BMET:  Recent Labs    04/19/20 0158 04/20/20 0140  NA 128* 132*  K 3.9 3.7  CL 90* 92*  CO2 27 29  GLUCOSE 109* 113*  BUN 11 12  CREATININE 0.93 1.03  CALCIUM 8.4* 8.5*    PT/INR:  Recent Labs    04/21/20 0307  LABPROT 14.5  INR 1.2   ABG    Component Value Date/Time   PHART 7.322 (L) 04/14/2020 1936   HCO3 22.7 04/14/2020 1936   TCO2 24 04/14/2020 1936   ACIDBASEDEF 3.0 (H) 04/14/2020 1936   O2SAT 99.0 04/14/2020 1936   CBG (last 3)  No results for input(s): GLUCAP in the last 72 hours.  Meds Scheduled Meds: . aspirin EC  81 mg Oral Daily  . docusate sodium  200 mg Oral Daily  . enoxaparin (LOVENOX) injection  30 mg Subcutaneous QHS  . furosemide  40 mg Oral Daily  . magnesium oxide  400 mg  Oral BID  . mouth rinse  15 mL Mouth Rinse BID  . metoprolol tartrate  12.5 mg Oral BID  . rosuvastatin  5 mg Oral Daily  . sodium chloride flush  3 mL Intravenous Q12H  . tamsulosin  0.4 mg Oral Daily  . warfarin  2.5 mg Oral q1800  . Warfarin - Physician Dosing Inpatient   Does not apply q1600   Continuous Infusions: . sodium chloride     PRN Meds:.sodium chloride, acetaminophen, albuterol, bisacodyl **OR** bisacodyl, guaiFENesin, ondansetron **OR** ondansetron (ZOFRAN) IV, oxyCODONE, sodium chloride flush, traMADol  Xrays No results found.  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  D/C home today.  Rexene Alberts, MD 04/21/2020 8:16 AM

## 2020-04-22 ENCOUNTER — Ambulatory Visit: Payer: Medicare HMO | Admitting: Pharmacist

## 2020-04-22 ENCOUNTER — Other Ambulatory Visit: Payer: Self-pay

## 2020-04-22 DIAGNOSIS — Z5181 Encounter for therapeutic drug level monitoring: Secondary | ICD-10-CM | POA: Insufficient documentation

## 2020-04-22 DIAGNOSIS — Z953 Presence of xenogenic heart valve: Secondary | ICD-10-CM

## 2020-04-22 DIAGNOSIS — Z7901 Long term (current) use of anticoagulants: Secondary | ICD-10-CM | POA: Insufficient documentation

## 2020-04-22 LAB — POCT INR: INR: 1.8 — AB (ref 2.0–3.0)

## 2020-04-22 NOTE — Patient Instructions (Addendum)
INSTRUCTIONS: INR below goal. Continue taking 2.5 mg by mouth daily. Recheck INR in 5 days  Warfarin tablets What is this medicine? WARFARIN (WAR far in) is an anticoagulant. It is used to treat or prevent clots in the veins, arteries, lungs, or heart. This medicine may be used for other purposes; ask your health care provider or pharmacist if you have questions. COMMON BRAND NAME(S): Coumadin, Jantoven What should I tell my health care provider before I take this medicine? They need to know if you have any of these conditions:  alcoholism  anemia  bleeding disorders  cancer  diabetes  heart disease  high blood pressure  history of bleeding in the gastrointestinal tract  history of stroke or other brain injury or disease  kidney or liver disease  protein C deficiency  protein S deficiency  psychosis or dementia  recent injury, recent or planned surgery or procedure  an unusual or allergic reaction to warfarin, other medicines, foods, dyes, or preservatives  pregnant or trying to get pregnant  breast-feeding How should I use this medicine? Take this medicine by mouth with a glass of water. Follow the directions on the prescription label. You can take this medicine with or without food. Take your medicine at the same time each day. Do not take it more often than directed. Do not stop taking except on your doctor's advice. Stopping this medicine may increase your risk of a blood clot. Be sure to refill your prescription before you run out of medicine. If your doctor or healthcare professional calls to change your dose, write down the dose and any other instructions. Always read the dose and instructions back to him or her to make sure you understand them. Tell your doctor or healthcare professional what strength of tablets you have on hand. Ask how many tablets you should take to equal your new dose. Write the date on the new instructions and keep them near your medicine. If  you are told to stop taking your medicine until your next blood test, call your doctor or healthcare professional if you do not hear anything within 24 hours of the test to find out your new dose or when to restart your prior dose. A special MedGuide will be given to you by the pharmacist with each prescription and refill. Be sure to read this information carefully each time. Talk to your pediatrician regarding the use of this medicine in children. Special care may be needed. Overdosage: If you think you have taken too much of this medicine contact a poison control center or emergency room at once. NOTE: This medicine is only for you. Do not share this medicine with others. What if I miss a dose? It is important not to miss a dose. If you miss a dose, call your healthcare provider. Take the dose as soon as possible on the same day. If it is almost time for your next dose, take only that dose. Do not take double or extra doses to make up for a missed dose. What may interact with this medicine? Do not take this medicine with any of the following medications:  agents that prevent or dissolve blood clots  aspirin or other salicylates  danshen  dextrothyroxine  mifepristone  St. John's Wort  red yeast rice This medicine may also interact with the following medications:  acetaminophen  agents that lower cholesterol  alcohol  allopurinol  amiodarone  antibiotics or medicines for treating bacterial, fungal or viral infections  azathioprine  barbiturate  medicines for inducing sleep or treating seizures  certain medicines for diabetes  certain medicines for heart rhythm problems  certain medicines for hepatitis C virus infections like daclatasvir, dasabuvir; ombitasvir; paritaprevir; ritonavir, elbasvir; grazoprevir, ledipasvir; sofosbuvir, simeprevir, sofosbuvir, sofosbuvir; velpatasvir, sofosbuvir; velpatasvir; voxilaprevir  certain medicines for high blood pressure  chloral  hydrate  cisapride  conivaptan  disulfiram  male hormones, including contraceptive or birth control pills  general anesthetics  herbal or dietary products like garlic, ginkgo, ginseng, green tea, or kava kava  influenza virus vaccine  male hormones  medicines for mental depression or psychosis  medicines for some types of cancer  medicines for stomach problems  methylphenidate  NSAIDs, medicines for pain and inflammation, like ibuprofen or naproxen  propoxyphene  quinidine, quinine  raloxifene  seizure or epilepsy medicine like carbamazepine, phenytoin, and valproic acid  steroids like cortisone and prednisone  tamoxifen  thyroid medicine  tramadol  vitamin c, vitamin e, and vitamin K  zafirlukast  zileuton This list may not describe all possible interactions. Give your health care provider a list of all the medicines, herbs, non-prescription drugs, or dietary supplements you use. Also tell them if you smoke, drink alcohol, or use illegal drugs. Some items may interact with your medicine. What should I watch for while using this medicine? Visit your healthcare professional for regular checks on your progress. You will need to have a blood test called a PT/INR regularly. The PT/INR blood test is done to make sure you are getting the right dose of this medicine. It is important to not miss your appointment for the blood tests. When you first start taking this medicine, these tests are done often. Once the correct dose is determined and you take your medicine properly, these tests can be done less often. Wear a medical ID bracelet or chain, and carry a card that describes your disease and details of your medicine and dosage times. Do not start taking or stop taking any medicines or over-the-counter medicines except on the advice of your healthcare professional. You should discuss your diet with your healthcare professional. Do not make major changes in your diet.  Vitamin K can affect how well this medicine works. Many foods contain vitamin K. It is important to eat a consistent amount of foods with vitamin K. Other foods with vitamin K that you should eat in consistent amounts are asparagus, basil, black-eyed peas, broccoli, brussel sprouts, cabbage, green onions, green tea, parsley, green leafy vegetables like beet greens, collard greens, kale, spinach, turnip greens, or certain lettuces like green leaf or romaine. This medicine can cause birth defects or bleeding in an unborn child. Women of childbearing age should use effective birth control while taking this medicine. If a woman becomes pregnant while taking this medicine, she should discuss the potential risks and her options with her healthcare professional. Avoid sports and activities that might cause injury while you are using this medicine. Severe falls or injuries can cause unseen bleeding. Be careful when using sharp tools or knives. Consider using an Copy. Take special care brushing or flossing your teeth. Report any injuries, bruising, or red spots on the skin to your healthcare professional. If you have an illness that causes vomiting, diarrhea, or fever for more than a few days, contact your health care professional. Also, check with your healthcare professional if you are unable to eat for several days. These problems can change the effect of this medicine. Even after you stop taking this medicine, it takes  several days before your body recovers its normal ability to clot blood. Ask your healthcare professional how long you need to be careful. If you are going to have surgery or dental work, tell your health care professional that you have been taking this medicine. What side effects may I notice from receiving this medicine? Side effects that you should report to your doctor or health care professional as soon as possible:  allergic reactions like skin rash, itching or hives, swelling of  the face, lips, or tongue  heavy menstrual bleeding or vaginal bleeding  painful, blue or purple toes  painful skin ulcers that do not go away  signs and symptoms of bleeding such as bloody or black, tarry stools; red or dark-brown urine; spitting up blood or brown material that looks like coffee grounds; red spots on the skin; unusual bruising or bleeding from the eye, gums, or nose  signs and symptoms of a blood clot such as chest pain; shortness of breath; pain, swelling, or warmth in the leg  signs and symptoms of a stroke such as changes in vision; confusion; trouble speaking or understanding; severe headaches; sudden numbness or weakness of the face, arm or leg; trouble walking; dizziness; loss of coordination  stomach pain  unusually weak or tired Side effects that usually do not require medical attention (report to your doctor or health care professional if they continue or are bothersome):  diarrhea  hair loss This list may not describe all possible side effects. Call your doctor for medical advice about side effects. You may report side effects to FDA at 1-800-FDA-1088. Where should I keep my medicine? Keep out of the reach of children. Store at room temperature between 15 and 30 degrees C (59 and 86 degrees F). Protect from light. Throw away any unused medicine after the expiration date. Do not flush down the toilet. NOTE: This sheet is a summary. It may not cover all possible information. If you have questions about this medicine, talk to your doctor, pharmacist, or health care provider.  2020 Elsevier/Gold Standard (2017-05-23 13:06:45)  Vitamin K Foods and Warfarin Warfarin is a blood thinner (anticoagulant). Anticoagulant medicines help prevent the formation of blood clots. These medicines work by decreasing the activity of vitamin K, which promotes normal blood clotting. When you take warfarin, problems can occur from suddenly increasing or decreasing the amount of  vitamin K that you eat from one day to the next. Problems may include:  Blood clots.  Bleeding. What general guidelines do I need to follow? To avoid problems when taking warfarin:  Eat a balanced diet that includes: ? Fresh fruits and vegetables. ? Whole grains. ? Low-fat dairy products. ? Lean proteins, such as fish, eggs, and lean cuts of meat.  Keep your intake of vitamin K consistent from day to day. To do this: ? Avoid eating large amounts of vitamin K one day and low amounts of vitamin K the next day. ? If you take a multivitamin that contains vitamin K, be sure to take it every day. ? Know which foods contain vitamin K. Use the lists below to understand serving sizes and the amount of vitamin K in one serving.  Avoid major changes in your diet. If you are going to change your diet, talk with your health care provider before making changes.  Work with a Financial planner (dietitian) to develop a meal plan that works best for you.  High vitamin K foods Foods that are high in vitamin K contain  more than 100 mcg (micrograms) per serving. These include:  Broccoli (cooked) -  cup has 110 mcg.  Brussels sprouts (cooked) -  cup has 109 mcg.  Greens, beet (cooked) -  cup has 350 mcg.  Greens, collard (cooked) -  cup has 418 mcg.  Greens, turnip (cooked) -  cup has 265 mcg.  Green onions or scallions -  cup has 105 mcg.  Kale (fresh or frozen) -  cup has 531 mcg.  Parsley (raw) - 10 sprigs has 164 mcg.  Spinach (cooked) -  cup has 444 mcg.  Swiss chard (cooked) -  cup has 287 mcg. Moderate vitamin K foods Foods that have a moderate amount of vitamin K contain 25-100 mcg per serving. These include:  Asparagus (cooked) - 5 spears have 38 mcg.  Black-eyed peas (dried) -  cup has 32 mcg.  Cabbage (cooked) -  cup has 37 mcg.  Kiwi fruit - 1 medium has 31 mcg.  Lettuce - 1 cup has 57-63 mcg.  Okra (frozen) -  cup has 44 mcg.  Prunes (dried) - 5  prunes have 25 mcg.  Watercress (raw) - 1 cup has 85 mcg. Low vitamin K foods Foods low in vitamin K contain less than 25 mcg per serving. These include:  Artichoke - 1 medium has 18 mcg.  Avocado - 1 oz. has 6 mcg.  Blueberries -  cup has 14 mcg.  Cabbage (raw) -  cup has 21 mcg.  Carrots (cooked) -  cup has 11 mcg.  Cauliflower (raw) -  cup has 11 mcg.  Cucumber with peel (raw) -  cup has 9 mcg.  Grapes -  cup has 12 mcg.  Mango - 1 medium has 9 mcg.  Nuts - 1 oz. has 15 mcg.  Pear - 1 medium has 8 mcg.  Peas (cooked) -  cup has 19 mcg.  Pickles - 1 spear has 14 mcg.  Pumpkin seeds - 1 oz. has 13 mcg.  Sauerkraut (canned) -  cup has 16 mcg.  Soybeans (cooked) -  cup has 16 mcg.  Tomato (raw) - 1 medium has 10 mcg.  Tomato sauce -  cup has 17 mcg. Vitamin K-free foods If a food contain less than 5 mcg per serving, it is considered to have no vitamin K. These foods include:  Bread and cereal products.  Cheese.  Eggs.  Fish and shellfish.  Meat and poultry.  Milk and dairy products.  Sunflower seeds. Actual amounts of vitamin K in foods may be different depending on processing. Talk with your dietitian about what foods you can eat and what foods you should avoid. This information is not intended to replace advice given to you by your health care provider. Make sure you discuss any questions you have with your health care provider. Document Revised: 07/20/2017 Document Reviewed: 11/10/2015 Elsevier Patient Education  2020 [place].  Bleeding Precautions When on Anticoagulant Therapy, Adult Anticoagulant therapy, also called blood thinner therapy, is medicine that helps to prevent and treat blood clots. The medicine works by stopping blood clots from forming or growing. Blood clots that form in your blood vessels can be dangerous. They can break loose and travel to the heart, lungs, or brain. This increases the risk of a heart attack, stroke,  or blocked lung artery (pulmonary embolism). Anticoagulants also increase the risk of bleeding. Try to protect yourself from cuts and other injuries that can cause bleeding. It is important to take anticoagulants exactly as told by  your health care provider. Why do I need to be on anticoagulant therapy? You may need this medicine if you are at risk of developing a blood clot. Conditions that increase your risk of a blood clot include:  Being born with heart disease or a heart malformation (congenital heart disease).  Developing heart disease.  Having had surgery, such as valve replacement.  Having had a serious accident or other type of severe injury (trauma).  Having certain types of cancer.  Having certain diseases that can increase blood clotting.  Having a high risk of stroke or heart attack.  Having atrial fibrillation (AF). What are the common anticoagulant medicines? There are several types of anticoagulant medicines. The most common types are:  Medicines that you take by mouth (oral medicines), such as: ? Warfarin. ? Novel oral anticoagulants (NOACs), such as:  Direct thrombin inhibitors (dabigatran).  Factor Xa inhibitors (apixaban, edoxaban, and rivaroxaban).  Injections, such as: ? Unfractionated heparin. ? Low molecular weight heparin. These anticoagulants work in different ways to prevent blood clots. They also have different risks and side effects. What do I need to remember while on anticoagulant therapy? Taking anticoagulants  Take your medicine at the same time every day. If you forget to take your medicine, take it as soon as you remember. Do not double your dosage of medicine if you miss a whole day. Take your normal dose and call your health care provider.  Do not stop taking your medicine unless your health care provider approves. Stopping the medicine can increase your risk of developing a blood clot. Taking other medicines  Take over-the-counter and  prescriptions medicines only as told by your health care provider.  Do not take over-the-counter NSAIDs, including aspirin and ibuprofen, while you are on anticoagulant therapy. These medicines increase your risk of dangerous bleeding.  Get approval from your health care provider before you start taking any new medicines, vitamins, or herbal products. Some of these could interfere with your therapy. General instructions  Keep all follow-up visits as told by your health care provider. This is important.  If you are pregnant or trying to get pregnant, talk with a health care provider about anticoagulants. Some of these medicines are not safe to take during pregnancy.  Tell all health care providers, including your dentist, that you are on anticoagulant therapy. It is especially important to tell providers before you have any surgery, medical procedures, or dental work done. What precautions should I take?   Be very careful when using knives, scissors, or other sharp objects.  Use an electric razor instead of a blade.  Do not use toothpicks.  Use a soft-bristled toothbrush. Brush your teeth gently.  Always wear shoes outdoors and wear slippers indoors.  Be careful when cutting your fingernails and toenails.  Place bath mats in the bathroom. If possible, install handrails as well.  Wear gloves while you do yard work.  Wear your seat belt.  Prevent falls by removing loose rugs and extension cords from areas where you walk. Use a cane or walker if you need it.  Avoid constipation by: ? Drinking enough fluid to keep your urine clear or pale yellow. ? Eating foods that are high in fiber, such as fresh fruits and vegetables, whole grains, and beans. ? Limiting foods that are high in fat and processed sugars, such as fried and sweet foods.  Do not play contact sports or participate in other activities that have a high risk for injury. What other precautions  are important if on warfarin  therapy? If you are taking a type of anticoagulant called warfarin, make sure you:  Work with a diet and nutrition specialist (dietitian) to make an eating plan. Do not make any sudden changes to your diet after you have started your eating plan.  Do not drink alcohol. It can interfere with your medicine and increase your risk of an injury that causes bleeding.  Get regular blood tests as told by your health care provider. What are some questions to ask my health care provider?  Why do I need anticoagulant therapy?  What is the best anticoagulant therapy for my condition?  How long will I need anticoagulant therapy?  What are the side effects of anticoagulant therapy?  When should I take my medicine? What should I do if I forget to take it?  Will I need to have regular blood tests?  Do I need to change my diet? Are there foods or drinks that I should avoid?  What activities are safe for me?  What should I do if I want to get pregnant? Contact a health care provider if:  You miss a dose of medicine: ? And you are not sure what to do. ? For more than one day.  You have: ? Menstrual bleeding that is heavier than normal. ? Bloody or brown urine. ? Easy bruising. ? Black and tarry stool or bright red stool. ? Side effects from your medicine.  You feel weak or dizzy.  You become pregnant. Get help right away if:  You have bleeding that will not stop within 20 minutes from: ? The nose. ? The gums. ? A cut on the skin.  You have a severe headache or stomachache.  You vomit or cough up blood.  You fall or hit your head. Summary  Anticoagulant therapy, also called blood thinner therapy, is medicine that helps to prevent and treat blood clots.  Anticoagulants work in different ways to prevent blood clots. They also have different risks and side effects.  Talk with your health care provider about any precautions that you should take while on anticoagulant  therapy. This information is not intended to replace advice given to you by your health care provider. Make sure you discuss any questions you have with your health care provider. Document Revised: 11/27/2018 Document Reviewed: 10/24/2016 Elsevier Patient Education  [place].

## 2020-04-22 NOTE — Progress Notes (Signed)
Anticoagulation Management Kurt Allen is a 74 y.o. male who reports to the clinic for monitoring of warfarin treatment.    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 Started on warfarin therapy on 04/19/20 and was discharged with 2.5 mg daily. INR goal of 2-3 per Dr. Roxy Manns. Episodes of Afib while admitted and pt was started on ammio drip in patient. NSR before discharge. Pt denies any complains of CP, palpitations, dizziness, lightheadedness since being discharged.   Patient does not report signs/symptoms of bleeding or thromboembolism   Other recent changes: No change in diet, medications, lifestyle. Missed dose x1 yesterday after being discharged since pt reports that his pharmacy didn't have any warfarin 2.5 mg in stock and order to be completed later today.  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  acetaminophen (TYLENOL) 325 MG tablet, Take 2 tablets (650 mg total) by mouth every 6 (six) hours as needed for mild pain., Disp: , Rfl:  .  albuterol (VENTOLIN HFA) 108 (90 Base) MCG/ACT inhaler, Inhale 1 puff into the lungs every 4 (four) hours as needed for wheezing or shortness of breath. , Disp: , Rfl:  .  aspirin EC 81 MG EC tablet, Take 1 tablet (81 mg total) by mouth daily. Swallow whole., Disp: , Rfl:  .  Cholecalciferol (VITAMIN D3)  1.25 MG (50000 UT) CAPS, Take 50,000 Units by mouth once a week. , Disp: , Rfl:  .  fluticasone (FLONASE) 50 MCG/ACT nasal spray, Place 1 spray into both nostrils daily as needed for rhinitis. , Disp: , Rfl:  .  magnesium oxide (MAG-OX) 400 (241.3 Mg) MG tablet, Take 1 tablet (400 mg total) by mouth 2 (two) times daily., Disp: 30 tablet, Rfl: 0 .  metoprolol tartrate (LOPRESSOR) 25 MG tablet, Take 0.5 tablets (12.5 mg total) by mouth 2 (two) times daily., Disp: 30 tablet, Rfl: 1 .  nicotine (NICODERM CQ - DOSED IN MG/24 HOURS) 21 mg/24hr patch, Place 21 mg onto the skin daily. , Disp: , Rfl:  .  rosuvastatin (CRESTOR) 10 MG tablet, Take 0.5 tablets (5 mg total) by mouth daily., Disp: 30 tablet, Rfl: 2 .  tamsulosin (FLOMAX) 0.4 MG CAPS capsule, Take 0.4 mg by mouth daily. , Disp: , Rfl:  .  traMADol (ULTRAM) 50 MG tablet, Take 1 tablet (50 mg total) by mouth every 6 (six) hours as needed for moderate pain., Disp: 28 tablet, Rfl: 0 .  warfarin (COUMADIN) 2.5 MG tablet, Take 1 tablet (2.5 mg total) by mouth daily at 6 PM. As directed by coumadin clinic at cardiologist office, Disp: 100 tablet, Rfl: 1 Past Medical History:  Diagnosis Date  . Centrilobular emphysema ([place])   . Hyperlipidemia   . Hypertension   . S/P ascending aortic aneurysm repair 04/14/2020  . 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 Severe aortic stenosis   . Thoracic ascending aortic aneurysm ([place])   . Tobacco abuse     ASSESSMENT  Recent Results: The most recent result is correlated with 17 mg per week:  Lab Results  Component Value Date   INR 1.8 (A) 04/22/2020   INR 1.2 04/21/2020   INR 1.1 04/20/2020    Anticoagulation Dosing:    INR today: Subtherapeutic. New start warfarin. Missed dose yesterday likely to further affect  pt's INR resonse over the next few days. Bump in INR of 0.6 over the past day. Will continue 2.5 mg daily and continue close monitoring.   Provided patient with verbal and written information regarding purpose, proper use, and potential adverse effects of warfarin. Included information regarding drug interactions, dietary interactions, need to avoid or limit alcohol intake, necessary INR monitoring, and reporting any unexpected bleeding. Patient described understanding.    PLAN Weekly dose was unchanged by 0% to 17 mg per week. Continue taking 2.5 mg daily. Recheck INR in 5 days  Patient Instructions  INSTRUCTIONS: INR below goal. Continue taking 2.5 mg by mouth daily. Recheck INR in 5 days  Warfarin tablets What is this medicine? WARFARIN (WAR far in) is an anticoagulant. It is used to treat or prevent clots in the veins, arteries, lungs, or heart. This medicine may be used for other purposes; ask your health care provider or pharmacist if you have questions. COMMON BRAND NAME(S): Coumadin, Jantoven What should I tell my health care provider before I take this medicine? They need to know if you have any of these conditions:  alcoholism  anemia  bleeding disorders  cancer  diabetes  heart disease  high blood pressure  history of bleeding in the gastrointestinal tract  history of stroke or other brain injury or disease  kidney or liver disease  protein C deficiency  protein S deficiency  psychosis or dementia  recent injury, recent or planned surgery or procedure  an unusual or allergic reaction to warfarin, other medicines, foods, dyes, or preservatives  pregnant or trying to get pregnant  breast-feeding How should I use this medicine? Take this medicine by mouth with a glass of water. Follow the directions on the prescription label. You can take this medicine with or without food. Take your medicine at the same time each day. Do not take it more often than  directed. Do not stop taking except on your doctor's advice. Stopping this medicine may increase your risk of a blood clot. Be sure to refill your prescription before you run out of medicine. If your doctor or healthcare professional calls to change your dose, write down the dose and any other instructions. Always read the dose and instructions back to him or her to make sure you understand them. Tell your doctor or healthcare professional what strength of tablets you have on hand. Ask how many tablets you should take to equal your new dose. Write the date on the new instructions and keep them near your medicine. If you are told to stop taking your medicine until your next blood test, call your doctor or healthcare professional if you do not hear anything within 24 hours of the test to find out your new dose or when to restart your prior dose. A special MedGuide will be given to you by  the pharmacist with each prescription and refill. Be sure to read this information carefully each time. Talk to your pediatrician regarding the use of this medicine in children. Special care may be needed. Overdosage: If you think you have taken too much of this medicine contact a poison control center or emergency room at once. NOTE: This medicine is only for you. Do not share this medicine with others. What if I miss a dose? It is important not to miss a dose. If you miss a dose, call your healthcare provider. Take the dose as soon as possible on the same day. If it is almost time for your next dose, take only that dose. Do not take double or extra doses to make up for a missed dose. What may interact with this medicine? Do not take this medicine with any of the following medications:  agents that prevent or dissolve blood clots  aspirin or other salicylates  danshen  dextrothyroxine  mifepristone  St. John's Wort  red yeast rice This medicine may also interact with the following  medications:  acetaminophen  agents that lower cholesterol  alcohol  allopurinol  amiodarone  antibiotics or medicines for treating bacterial, fungal or viral infections  azathioprine  barbiturate medicines for inducing sleep or treating seizures  certain medicines for diabetes  certain medicines for heart rhythm problems  certain medicines for hepatitis C virus infections like daclatasvir, dasabuvir; ombitasvir; paritaprevir; ritonavir, elbasvir; grazoprevir, ledipasvir; sofosbuvir, simeprevir, sofosbuvir, sofosbuvir; velpatasvir, sofosbuvir; velpatasvir; voxilaprevir  certain medicines for high blood pressure  chloral hydrate  cisapride  conivaptan  disulfiram  male hormones, including contraceptive or birth control pills  general anesthetics  herbal or dietary products like garlic, ginkgo, ginseng, green tea, or kava kava  influenza virus vaccine  male hormones  medicines for mental depression or psychosis  medicines for some types of cancer  medicines for stomach problems  methylphenidate  NSAIDs, medicines for pain and inflammation, like ibuprofen or naproxen  propoxyphene  quinidine, quinine  raloxifene  seizure or epilepsy medicine like carbamazepine, phenytoin, and valproic acid  steroids like cortisone and prednisone  tamoxifen  thyroid medicine  tramadol  vitamin c, vitamin e, and vitamin K  zafirlukast  zileuton This list may not describe all possible interactions. Give your health care provider a list of all the medicines, herbs, non-prescription drugs, or dietary supplements you use. Also tell them if you smoke, drink alcohol, or use illegal drugs. Some items may interact with your medicine. What should I watch for while using this medicine? Visit your healthcare professional for regular checks on your progress. You will need to have a blood test called a PT/INR regularly. The PT/INR blood test is done to make sure you are  getting the right dose of this medicine. It is important to not miss your appointment for the blood tests. When you first start taking this medicine, these tests are done often. Once the correct dose is determined and you take your medicine properly, these tests can be done less often. Wear a medical ID bracelet or chain, and carry a card that describes your disease and details of your medicine and dosage times. Do not start taking or stop taking any medicines or over-the-counter medicines except on the advice of your healthcare professional. You should discuss your diet with your healthcare professional. Do not make major changes in your diet. Vitamin K can affect how well this medicine works. Many foods contain vitamin K. It is important to eat a consistent  amount of foods with vitamin K. Other foods with vitamin K that you should eat in consistent amounts are asparagus, basil, black-eyed peas, broccoli, brussel sprouts, cabbage, green onions, green tea, parsley, green leafy vegetables like beet greens, collard greens, kale, spinach, turnip greens, or certain lettuces like green leaf or romaine. This medicine can cause birth defects or bleeding in an unborn child. Women of childbearing age should use effective birth control while taking this medicine. If a woman becomes pregnant while taking this medicine, she should discuss the potential risks and her options with her healthcare professional. Avoid sports and activities that might cause injury while you are using this medicine. Severe falls or injuries can cause unseen bleeding. Be careful when using sharp tools or knives. Consider using an Copy. Take special care brushing or flossing your teeth. Report any injuries, bruising, or red spots on the skin to your healthcare professional. If you have an illness that causes vomiting, diarrhea, or fever for more than a few days, contact your health care professional. Also, check with your healthcare  professional if you are unable to eat for several days. These problems can change the effect of this medicine. Even after you stop taking this medicine, it takes several days before your body recovers its normal ability to clot blood. Ask your healthcare professional how long you need to be careful. If you are going to have surgery or dental work, tell your health care professional that you have been taking this medicine. What side effects may I notice from receiving this medicine? Side effects that you should report to your doctor or health care professional as soon as possible:  allergic reactions like skin rash, itching or hives, swelling of the face, lips, or tongue  heavy menstrual bleeding or vaginal bleeding  painful, blue or purple toes  painful skin ulcers that do not go away  signs and symptoms of bleeding such as bloody or black, tarry stools; red or dark-brown urine; spitting up blood or brown material that looks like coffee grounds; red spots on the skin; unusual bruising or bleeding from the eye, gums, or nose  signs and symptoms of a blood clot such as chest pain; shortness of breath; pain, swelling, or warmth in the leg  signs and symptoms of a stroke such as changes in vision; confusion; trouble speaking or understanding; severe headaches; sudden numbness or weakness of the face, arm or leg; trouble walking; dizziness; loss of coordination  stomach pain  unusually weak or tired Side effects that usually do not require medical attention (report to your doctor or health care professional if they continue or are bothersome):  diarrhea  hair loss This list may not describe all possible side effects. Call your doctor for medical advice about side effects. You may report side effects to FDA at 1-800-FDA-1088. Where should I keep my medicine? Keep out of the reach of children. Store at room temperature between 15 and 30 degrees C (59 and 86 degrees F). Protect from light.  Throw away any unused medicine after the expiration date. Do not flush down the toilet. NOTE: This sheet is a summary. It may not cover all possible information. If you have questions about this medicine, talk to your doctor, pharmacist, or health care provider.  2020 Elsevier/Gold Standard (2017-05-23 13:06:45)  Vitamin K Foods and Warfarin Warfarin is a blood thinner (anticoagulant). Anticoagulant medicines help prevent the formation of blood clots. These medicines work by decreasing the activity of vitamin K, which promotes  normal blood clotting. When you take warfarin, problems can occur from suddenly increasing or decreasing the amount of vitamin K that you eat from one day to the next. Problems may include:  Blood clots.  Bleeding. What general guidelines do I need to follow? To avoid problems when taking warfarin:  Eat a balanced diet that includes: ? Fresh fruits and vegetables. ? Whole grains. ? Low-fat dairy products. ? Lean proteins, such as fish, eggs, and lean cuts of meat.  Keep your intake of vitamin K consistent from day to day. To do this: ? Avoid eating large amounts of vitamin K one day and low amounts of vitamin K the next day. ? If you take a multivitamin that contains vitamin K, be sure to take it every day. ? Know which foods contain vitamin K. Use the lists below to understand serving sizes and the amount of vitamin K in one serving.  Avoid major changes in your diet. If you are going to change your diet, talk with your health care provider before making changes.  Work with a Financial planner (dietitian) to develop a meal plan that works best for you.  High vitamin K foods Foods that are high in vitamin K contain more than 100 mcg (micrograms) per serving. These include:  Broccoli (cooked) -  cup has 110 mcg.  Brussels sprouts (cooked) -  cup has 109 mcg.  Greens, beet (cooked) -  cup has 350 mcg.  Greens, collard (cooked) -  cup has 418  mcg.  Greens, turnip (cooked) -  cup has 265 mcg.  Green onions or scallions -  cup has 105 mcg.  Kale (fresh or frozen) -  cup has 531 mcg.  Parsley (raw) - 10 sprigs has 164 mcg.  Spinach (cooked) -  cup has 444 mcg.  Swiss chard (cooked) -  cup has 287 mcg. Moderate vitamin K foods Foods that have a moderate amount of vitamin K contain 25-100 mcg per serving. These include:  Asparagus (cooked) - 5 spears have 38 mcg.  Black-eyed peas (dried) -  cup has 32 mcg.  Cabbage (cooked) -  cup has 37 mcg.  Kiwi fruit - 1 medium has 31 mcg.  Lettuce - 1 cup has 57-63 mcg.  Okra (frozen) -  cup has 44 mcg.  Prunes (dried) - 5 prunes have 25 mcg.  Watercress (raw) - 1 cup has 85 mcg. Low vitamin K foods Foods low in vitamin K contain less than 25 mcg per serving. These include:  Artichoke - 1 medium has 18 mcg.  Avocado - 1 oz. has 6 mcg.  Blueberries -  cup has 14 mcg.  Cabbage (raw) -  cup has 21 mcg.  Carrots (cooked) -  cup has 11 mcg.  Cauliflower (raw) -  cup has 11 mcg.  Cucumber with peel (raw) -  cup has 9 mcg.  Grapes -  cup has 12 mcg.  Mango - 1 medium has 9 mcg.  Nuts - 1 oz. has 15 mcg.  Pear - 1 medium has 8 mcg.  Peas (cooked) -  cup has 19 mcg.  Pickles - 1 spear has 14 mcg.  Pumpkin seeds - 1 oz. has 13 mcg.  Sauerkraut (canned) -  cup has 16 mcg.  Soybeans (cooked) -  cup has 16 mcg.  Tomato (raw) - 1 medium has 10 mcg.  Tomato sauce -  cup has 17 mcg. Vitamin K-free foods If a food contain less than 5 mcg per serving, it  is considered to have no vitamin K. These foods include:  Bread and cereal products.  Cheese.  Eggs.  Fish and shellfish.  Meat and poultry.  Milk and dairy products.  Sunflower seeds. Actual amounts of vitamin K in foods may be different depending on processing. Talk with your dietitian about what foods you can eat and what foods you should avoid. This information is not intended to  replace advice given to you by your health care provider. Make sure you discuss any questions you have with your health care provider. Document Revised: 07/20/2017 Document Reviewed: 11/10/2015 Elsevier Patient Education  2020 [place].  Bleeding Precautions When on Anticoagulant Therapy, Adult Anticoagulant therapy, also called blood thinner therapy, is medicine that helps to prevent and treat blood clots. The medicine works by stopping blood clots from forming or growing. Blood clots that form in your blood vessels can be dangerous. They can break loose and travel to the heart, lungs, or brain. This increases the risk of a heart attack, stroke, or blocked lung artery (pulmonary embolism). Anticoagulants also increase the risk of bleeding. Try to protect yourself from cuts and other injuries that can cause bleeding. It is important to take anticoagulants exactly as told by your health care provider. Why do I need to be on anticoagulant therapy? You may need this medicine if you are at risk of developing a blood clot. Conditions that increase your risk of a blood clot include:  Being born with heart disease or a heart malformation (congenital heart disease).  Developing heart disease.  Having had surgery, such as valve replacement.  Having had a serious accident or other type of severe injury (trauma).  Having certain types of cancer.  Having certain diseases that can increase blood clotting.  Having a high risk of stroke or heart attack.  Having atrial fibrillation (AF). What are the common anticoagulant medicines? There are several types of anticoagulant medicines. The most common types are:  Medicines that you take by mouth (oral medicines), such as: ? Warfarin. ? Novel oral anticoagulants (NOACs), such as:  Direct thrombin inhibitors (dabigatran).  Factor Xa inhibitors (apixaban, edoxaban, and rivaroxaban).  Injections, such as: ? Unfractionated heparin. ? Low molecular  weight heparin. These anticoagulants work in different ways to prevent blood clots. They also have different risks and side effects. What do I need to remember while on anticoagulant therapy? Taking anticoagulants  Take your medicine at the same time every day. If you forget to take your medicine, take it as soon as you remember. Do not double your dosage of medicine if you miss a whole day. Take your normal dose and call your health care provider.  Do not stop taking your medicine unless your health care provider approves. Stopping the medicine can increase your risk of developing a blood clot. Taking other medicines  Take over-the-counter and prescriptions medicines only as told by your health care provider.  Do not take over-the-counter NSAIDs, including aspirin and ibuprofen, while you are on anticoagulant therapy. These medicines increase your risk of dangerous bleeding.  Get approval from your health care provider before you start taking any new medicines, vitamins, or herbal products. Some of these could interfere with your therapy. General instructions  Keep all follow-up visits as told by your health care provider. This is important.  If you are pregnant or trying to get pregnant, talk with a health care provider about anticoagulants. Some of these medicines are not safe to take during pregnancy.  Tell all  health care providers, including your dentist, that you are on anticoagulant therapy. It is especially important to tell providers before you have any surgery, medical procedures, or dental work done. What precautions should I take?   Be very careful when using knives, scissors, or other sharp objects.  Use an electric razor instead of a blade.  Do not use toothpicks.  Use a soft-bristled toothbrush. Brush your teeth gently.  Always wear shoes outdoors and wear slippers indoors.  Be careful when cutting your fingernails and toenails.  Place bath mats in the bathroom.  If possible, install handrails as well.  Wear gloves while you do yard work.  Wear your seat belt.  Prevent falls by removing loose rugs and extension cords from areas where you walk. Use a cane or walker if you need it.  Avoid constipation by: ? Drinking enough fluid to keep your urine clear or pale yellow. ? Eating foods that are high in fiber, such as fresh fruits and vegetables, whole grains, and beans. ? Limiting foods that are high in fat and processed sugars, such as fried and sweet foods.  Do not play contact sports or participate in other activities that have a high risk for injury. What other precautions are important if on warfarin therapy? If you are taking a type of anticoagulant called warfarin, make sure you:  Work with a diet and nutrition specialist (dietitian) to make an eating plan. Do not make any sudden changes to your diet after you have started your eating plan.  Do not drink alcohol. It can interfere with your medicine and increase your risk of an injury that causes bleeding.  Get regular blood tests as told by your health care provider. What are some questions to ask my health care provider?  Why do I need anticoagulant therapy?  What is the best anticoagulant therapy for my condition?  How long will I need anticoagulant therapy?  What are the side effects of anticoagulant therapy?  When should I take my medicine? What should I do if I forget to take it?  Will I need to have regular blood tests?  Do I need to change my diet? Are there foods or drinks that I should avoid?  What activities are safe for me?  What should I do if I want to get pregnant? Contact a health care provider if:  You miss a dose of medicine: ? And you are not sure what to do. ? For more than one day.  You have: ? Menstrual bleeding that is heavier than normal. ? Bloody or brown urine. ? Easy bruising. ? Black and tarry stool or bright red stool. ? Side effects from your  medicine.  You feel weak or dizzy.  You become pregnant. Get help right away if:  You have bleeding that will not stop within 20 minutes from: ? The nose. ? The gums. ? A cut on the skin.  You have a severe headache or stomachache.  You vomit or cough up blood.  You fall or hit your head. Summary  Anticoagulant therapy, also called blood thinner therapy, is medicine that helps to prevent and treat blood clots.  Anticoagulants work in different ways to prevent blood clots. They also have different risks and side effects.  Talk with your health care provider about any precautions that you should take while on anticoagulant therapy. This information is not intended to replace advice given to you by your health care provider. Make sure you discuss any questions  you have with your health care provider. Document Revised: 11/27/2018 Document Reviewed: 10/24/2016 Elsevier Patient Education  [place].  Patient advised to contact clinic or seek medical attention if signs/symptoms of bleeding or thromboembolism occur.  Patient verbalized understanding by repeating back information and was advised to contact me if further medication-related questions arise.   Follow-up Return in about 5 days (around 04/27/2020).  Alysia Penna, PharmD  15 minutes spent face-to-face with the patient during the encounter. 50% of time spent on education, including signs/sx bleeding and clotting, as well as food and drug interactions with warfarin. 50% of time was spent on fingerprick POC INR sample collection,processing, results determination, and documentation

## 2020-04-27 ENCOUNTER — Ambulatory Visit: Payer: Medicare HMO | Admitting: Pharmacist

## 2020-04-27 ENCOUNTER — Other Ambulatory Visit: Payer: Self-pay

## 2020-04-27 DIAGNOSIS — Z5181 Encounter for therapeutic drug level monitoring: Secondary | ICD-10-CM

## 2020-04-27 DIAGNOSIS — Z953 Presence of xenogenic heart valve: Secondary | ICD-10-CM

## 2020-04-27 LAB — POCT INR: INR: 1.3 — AB (ref 2.0–3.0)

## 2020-04-27 NOTE — Patient Instructions (Signed)
INR below goal. Take 7.5 mg today and then increase weekly dose to 5 mg every Tue, Thur, 2.5 mg all other days. Recheck INR in 3 days.

## 2020-04-27 NOTE — Progress Notes (Signed)
Anticoagulation Management Kurt Allen is a 74 y.o. male who reports to the clinic for monitoring of warfarin treatment.    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 Started on warfarin therapy on 04/19/20 and was discharged with 2.5 mg daily. INR goal of 2-3 per Dr. Roxy Manns. Episodes of Afib while admitted and pt was started on ammio drip in patient. NSR before discharge. Pt denies any complains of CP, palpitations, dizziness, lightheadedness since being discharged.   Patient does not report signs/symptoms of bleeding or thromboembolism   Other recent changes: No change in diet, medications, lifestyle. Pt reports that he was able to pick up a green warfarin 2.5 mg tablets from his pharmacy after last INR checks and reports to be tolerating it well without any ADRs. Pt reports to be adherent to previously discussed weekly dose.   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  acetaminophen (TYLENOL) 325 MG tablet, Take 2 tablets (650 mg total) by mouth every 6 (six) hours as needed for mild pain., Disp: , Rfl:  .  albuterol (VENTOLIN HFA) 108 (90 Base) MCG/ACT inhaler, Inhale 1 puff into the lungs every 4 (four) hours as needed for wheezing or shortness of breath. , Disp: , Rfl:  .  aspirin EC 81 MG EC tablet, Take 1 tablet (81 mg total) by mouth  daily. Swallow whole., Disp: , Rfl:  .  Cholecalciferol (VITAMIN D3) 1.25 MG (50000 UT) CAPS, Take 50,000 Units by mouth once a week. , Disp: , Rfl:  .  fluticasone (FLONASE) 50 MCG/ACT nasal spray, Place 1 spray into both nostrils daily as needed for rhinitis. , Disp: , Rfl:  .  magnesium oxide (MAG-OX) 400 (241.3 Mg) MG tablet, Take 1 tablet (400 mg total) by mouth 2 (two) times daily., Disp: 30 tablet, Rfl: 0 .  metoprolol tartrate (LOPRESSOR) 25 MG tablet, Take 0.5 tablets (12.5 mg total) by mouth 2 (two) times daily., Disp: 30 tablet, Rfl: 1 .  nicotine (NICODERM CQ - DOSED IN MG/24 HOURS) 21 mg/24hr patch, Place 21 mg onto the skin daily. , Disp: , Rfl:  .  rosuvastatin (CRESTOR) 10 MG tablet, Take 0.5 tablets (5 mg total) by mouth daily., Disp: 30 tablet, Rfl: 2 .  tamsulosin (FLOMAX) 0.4 MG CAPS capsule, Take 0.4 mg by mouth daily. , Disp: , Rfl:  .  traMADol (ULTRAM) 50 MG tablet, Take 1 tablet (50 mg total) by mouth every 6 (six) hours as needed for moderate pain., Disp: 28 tablet, Rfl: 0 .  warfarin (COUMADIN) 2.5 MG tablet, Take 1 tablet (2.5 mg total) by mouth daily at 6 PM. As directed by coumadin clinic at cardiologist office, Disp: 100 tablet, Rfl: 1 Past Medical History:  Diagnosis Date  . Centrilobular emphysema ([place])   . Hyperlipidemia   .  Hypertension   . S/P ascending aortic aneurysm repair 04/14/2020  . 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 Severe aortic stenosis   . 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 Tobacco abuse    ASSESSMENT  Recent Results: The most recent result is correlated with 15 mg per week:  Lab Results  Component Value Date   INR 1.3 (A) 04/27/2020   INR 1.8 (A) 04/22/2020   INR 1.2 04/21/2020    Anticoagulation Dosing:    INR today:  Subtherapeutic. New start warfarin. INR trending down from last INR check. Missed dose x1 prior to last check. Pt denies any missed doses since. Reports to be back to his baseline eating habits. Requested pt to start documenting using a food diary to establish a baseline Vit K intake. Pt denies any s/sx of thromboembolic complications. Will boost today and and increase weekly dose. Will continue close monitoring as pt's baseline warfarin needs is being established. Pt reports to be able to return to mild physical activity but feeling mildly fatigue after 10 mins or so of sustained activity. Reports symptoms are improving.   PLAN Weekly dose was increased by 28.6% to 22.5 mg per week. Take 7.5 mg today and the increase weekly dose to 5 mg every Tue, Thur and 2.5 mg all other days. Recheck INR in 3 days.   Patient Instructions  INR below goal. Take 7.5 mg today and then increase weekly dose to 5 mg every Tue, Thur, 2.5 mg all other days. Recheck INR in 3 days.   Patient advised to contact clinic or seek medical attention if signs/symptoms of bleeding or thromboembolism occur.  Patient verbalized understanding by repeating back information and was advised to contact me if further medication-related questions arise.   Follow-up Return in about 3 days (around 04/30/2020).  Alysia Penna, PharmD  15 minutes spent face-to-face with the patient during the encounter. 50% of time spent on education, including signs/sx bleeding and clotting, as well as food and drug interactions with warfarin. 50% of time was spent on fingerprick POC INR sample collection,processing, results determination, and documentation

## 2020-04-30 ENCOUNTER — Other Ambulatory Visit: Payer: Self-pay

## 2020-04-30 ENCOUNTER — Ambulatory Visit: Payer: Medicare HMO | Admitting: Pharmacist

## 2020-04-30 DIAGNOSIS — Z953 Presence of xenogenic heart valve: Secondary | ICD-10-CM

## 2020-04-30 DIAGNOSIS — Z5181 Encounter for therapeutic drug level monitoring: Secondary | ICD-10-CM

## 2020-04-30 LAB — POCT INR: INR: 1.3 — AB (ref 2.0–3.0)

## 2020-04-30 NOTE — Progress Notes (Signed)
Anticoagulation Management Kurt Allen is a 74 y.o. male who reports to the clinic for monitoring of warfarin treatment.    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 Started on warfarin therapy on 04/19/20 and was discharged with 2.5 mg daily. INR goal of 2-3 per Dr. Roxy Manns. Episodes of Afib while admitted and pt was started on ammio drip in patient. NSR before discharge. Pt denies any complains of CP, palpitations, dizziness, lightheadedness since being discharged.   Patient does not report signs/symptoms of bleeding or thromboembolism   Other recent changes: No change in diet, medications, lifestyle. Reviewed food log together and pt reports to have no Vit K intake since last INR check.   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  acetaminophen (TYLENOL) 325 MG tablet, Take 2 tablets (650 mg total) by mouth every 6 (six) hours as needed for mild pain., Disp: , Rfl:  .  albuterol (VENTOLIN HFA) 108 (90 Base) MCG/ACT inhaler, Inhale 1 puff into the lungs every 4 (four) hours as needed for wheezing or shortness of breath. , Disp: , Rfl:  .  aspirin EC 81 MG EC tablet, Take 1 tablet (81 mg total) by mouth daily. Swallow whole., Disp: , Rfl:  .  Cholecalciferol (VITAMIN D3) 1.25 MG (50000 UT) CAPS, Take 50,000 Units by mouth once a week. , Disp: ,  Rfl:  .  fluticasone (FLONASE) 50 MCG/ACT nasal spray, Place 1 spray into both nostrils daily as needed for rhinitis. , Disp: , Rfl:  .  magnesium oxide (MAG-OX) 400 (241.3 Mg) MG tablet, Take 1 tablet (400 mg total) by mouth 2 (two) times daily., Disp: 30 tablet, Rfl: 0 .  metoprolol tartrate (LOPRESSOR) 25 MG tablet, Take 0.5 tablets (12.5 mg total) by mouth 2 (two) times daily., Disp: 30 tablet, Rfl: 1 .  nicotine (NICODERM CQ - DOSED IN MG/24 HOURS) 21 mg/24hr patch, Place 21 mg onto the skin daily. , Disp: , Rfl:  .  rosuvastatin (CRESTOR) 10 MG tablet, Take 0.5 tablets (5 mg total) by mouth daily., Disp: 30 tablet, Rfl: 2 .  tamsulosin (FLOMAX) 0.4 MG CAPS capsule, Take 0.4 mg by mouth daily. , Disp: , Rfl:  .  traMADol (ULTRAM) 50 MG tablet, Take 1 tablet (50 mg total) by mouth every 6 (six) hours as needed for moderate pain., Disp: 28 tablet, Rfl: 0 .  warfarin (COUMADIN) 2.5 MG tablet, Take 1 tablet (2.5 mg total) by mouth daily at 6 PM. As directed by coumadin clinic at cardiologist office, Disp: 100 tablet, Rfl: 1 Past Medical History:  Diagnosis Date  . Centrilobular emphysema ([place])   . Hyperlipidemia   . Hypertension   . S/P ascending aortic aneurysm repair 04/14/2020  . 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 Severe aortic stenosis   . 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 Tobacco abuse    ASSESSMENT  Recent Results: The most recent result is correlated with 15 mg per week:  Lab Results  Component Value Date   INR 1.3 (A) 04/30/2020   INR 1.3 (A) 04/27/2020   INR 1.8 (A) 04/22/2020    Anticoagulation Dosing:    INR today: Subtherapeutic. No change in INR despite boost doses since last check. Pt confirmed to have followed the documented anticoagulation directions previously  discussed. Pt initially started on low dose 2.5 mg daily post d/c. Continues to deny and s/sx of thromboembolic complications. Will continue to boost today and increase weekly dose. Will continue close monitoring and follow up. Reviewed management of bleeding and bruising symptoms.   PLAN Weekly dose was increased by 55.6% to 35 mg per week. Take 10 mg today and 7.5 mg tomorrow and the increase weekly dose to 5 mg everyday. Recheck INR in 4 days.   Patient Instructions  INR below goal. Take 10 mg today and 7.5 mg tomorrow, and then increase weekly dose to 5 mg everyday. Recheck INR in 4 days.   Patient advised to contact clinic or seek medical attention if signs/symptoms of bleeding or thromboembolism occur.  Patient verbalized understanding by repeating back information and was advised to contact me if further medication-related questions arise.   Follow-up Return in about 4 days (around 05/04/2020).  Alysia Penna, PharmD  15 minutes spent face-to-face with the patient during the encounter. 50% of time spent on education, including signs/sx bleeding and clotting, as well as food and drug interactions with warfarin. 50% of time was spent on fingerprick POC INR sample collection,processing, results determination, and documentation

## 2020-04-30 NOTE — Patient Instructions (Signed)
INR below goal. Take 10 mg today and 7.5 mg tomorrow, and then increase weekly dose to 5 mg everyday. Recheck INR in 4 days.

## 2020-05-04 ENCOUNTER — Ambulatory Visit: Payer: Medicare HMO | Admitting: Pharmacist

## 2020-05-04 ENCOUNTER — Other Ambulatory Visit: Payer: Self-pay

## 2020-05-04 DIAGNOSIS — Z953 Presence of xenogenic heart valve: Secondary | ICD-10-CM

## 2020-05-04 DIAGNOSIS — Z5181 Encounter for therapeutic drug level monitoring: Secondary | ICD-10-CM

## 2020-05-04 LAB — POCT INR: INR: 1.7 — AB (ref 2.0–3.0)

## 2020-05-04 NOTE — Patient Instructions (Signed)
INR below goal. Increase weekly dose to 7.5 mg every Tues, Thurs and 5 mg all other days. Recheck INR in 6 days.

## 2020-05-04 NOTE — Progress Notes (Signed)
Anticoagulation Management Kurt Allen is a 74 y.o. male who reports to the clinic for monitoring of warfarin treatment.    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 Started on warfarin therapy on 04/19/20 and was discharged with 2.5 mg daily. INR goal of 2-3 per Dr. Roxy Manns. Episodes of Afib while admitted and pt was started on ammio drip in patient. NSR before discharge. Pt denies any complains of CP, palpitations, dizziness, lightheadedness since being discharged.   Patient does not report signs/symptoms of bleeding or thromboembolism   Other recent changes: No change in diet, medications, lifestyle. Reviewed food log together and pt reports to have no Vit K intake since last INR check.   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  acetaminophen (TYLENOL) 325 MG tablet, Take 2 tablets (650 mg total) by mouth every 6 (six) hours as needed for mild pain., Disp: , Rfl:  .  albuterol (VENTOLIN HFA) 108 (90 Base) MCG/ACT inhaler, Inhale 1 puff into the lungs every 4 (four) hours as needed for wheezing or shortness of breath. , Disp: , Rfl:  .  aspirin EC 81 MG EC tablet, Take 1 tablet (81 mg total) by mouth daily. Swallow whole., Disp: , Rfl:  .  Cholecalciferol (VITAMIN D3) 1.25 MG (50000 UT) CAPS, Take 50,000 Units by mouth once a week.  , Disp: , Rfl:  .  fluticasone (FLONASE) 50 MCG/ACT nasal spray, Place 1 spray into both nostrils daily as needed for rhinitis. , Disp: , Rfl:  .  magnesium oxide (MAG-OX) 400 (241.3 Mg) MG tablet, Take 1 tablet (400 mg total) by mouth 2 (two) times daily., Disp: 30 tablet, Rfl: 0 .  metoprolol tartrate (LOPRESSOR) 25 MG tablet, Take 0.5 tablets (12.5 mg total) by mouth 2 (two) times daily., Disp: 30 tablet, Rfl: 1 .  nicotine (NICODERM CQ - DOSED IN MG/24 HOURS) 21 mg/24hr patch, Place 21 mg onto the skin daily. , Disp: , Rfl:  .  rosuvastatin (CRESTOR) 10 MG tablet, Take 0.5 tablets (5 mg total) by mouth daily., Disp: 30 tablet, Rfl: 2 .  tamsulosin (FLOMAX) 0.4 MG CAPS capsule, Take 0.4 mg by mouth daily. , Disp: , Rfl:  .  traMADol (ULTRAM) 50 MG tablet, Take 1 tablet (50 mg total) by mouth every 6 (six) hours as needed for moderate pain., Disp: 28 tablet, Rfl: 0 .  warfarin (COUMADIN) 2.5 MG tablet, Take 1 tablet (2.5 mg total) by mouth daily at 6 PM. As directed by coumadin clinic at cardiologist office, Disp: 100 tablet, Rfl: 1 Past Medical History:  Diagnosis Date  . Centrilobular emphysema ([place])   . Hyperlipidemia   . Hypertension   . S/P ascending aortic aneurysm repair 04/14/2020  . 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 Severe aortic stenosis   . Thoracic ascending aortic aneurysm ([place])   . Tobacco abuse    ASSESSMENT  Recent Results: The most recent result is correlated with 15 mg per week:  Lab Results  Component Value Date   INR 1.7 (A) 05/04/2020   INR 1.3 (A) 04/30/2020   INR 1.3 (A) 04/27/2020    Anticoagulation Dosing:    INR today: Subtherapeutic. INR trending up with boost dose and weekly dose increase. Pt confirmed to have followed the documented anticoagulation directions  previously discussed. Continues to deny and s/sx of thromboembolic complications. Will continue to increase weekly dose and close monitoring. Reviewed management of bleeding and bruising symptoms.   PLAN Weekly dose was increased by 14.3% to 40 mg per week. Take 7.5 mg every Tues, Thurs and 5 mg all other days. Recheck INR in 6 days.   Patient Instructions  INR below goal. Increase weekly dose to 7.5 mg every Tues, Thurs and 5 mg all other days. Recheck INR in 6 days.   Patient advised to contact clinic or seek medical attention if signs/symptoms of bleeding or thromboembolism occur.  Patient verbalized understanding by repeating back information and was advised to contact me if further medication-related questions arise.   Follow-up Return in about 6 days (around 05/10/2020).  Alysia Penna, PharmD  15 minutes spent face-to-face with the patient during the encounter. 50% of time spent on education, including signs/sx bleeding and clotting, as well as food and drug interactions with warfarin. 50% of time was spent on fingerprick POC INR sample collection,processing, results determination, and documentation

## 2020-05-10 ENCOUNTER — Telehealth: Payer: Self-pay | Admitting: Pulmonary Disease

## 2020-05-10 ENCOUNTER — Other Ambulatory Visit: Payer: Self-pay

## 2020-05-10 ENCOUNTER — Ambulatory Visit: Payer: Medicare HMO | Admitting: Pharmacist

## 2020-05-10 DIAGNOSIS — Z953 Presence of xenogenic heart valve: Secondary | ICD-10-CM

## 2020-05-10 DIAGNOSIS — Z7901 Long term (current) use of anticoagulants: Secondary | ICD-10-CM

## 2020-05-10 LAB — POCT INR: INR: 1.8 — AB (ref 2.0–3.0)

## 2020-05-10 NOTE — Patient Instructions (Signed)
INR below goal. Increase weekly dose to 7.5 mg every day. Recheck INR in 6 days.

## 2020-05-10 NOTE — Telephone Encounter (Signed)
Spoke with the pt  He is scheduled for rov with PFT on 05/13/20 and ov with Dr. Elsworth Soho  He has cardiac surgery on 04/14/20  He is asking if Dr Elsworth Soho thinks it's a good idea for his to still do PFT given he just had this surgery  He overall feels well but has some soreness in his ribs  Please advise thanks

## 2020-05-10 NOTE — Telephone Encounter (Signed)
Please cancel PFTs. He had this done already 8/23 prior to cardiac surgery

## 2020-05-10 NOTE — Progress Notes (Signed)
Anticoagulation Management Kurt Allen is a 74 y.o. male who reports to the clinic for monitoring of warfarin treatment.    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 Started on warfarin therapy on 04/19/20 and was discharged with 2.5 mg daily. INR goal of 2-3 per Dr. Roxy Manns. Episodes of Afib while admitted and pt was started on ammio drip in patient. NSR before discharge. Pt denies any complains of CP, palpitations, dizziness, lightheadedness since being discharged.   Patient does not report signs/symptoms of bleeding or thromboembolism   Other recent changes: No change in diet, medications, lifestyle. Reviewed food log together and pt reports to have no Vit K intake since last INR check.   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  acetaminophen (TYLENOL) 325 MG tablet, Take 2 tablets (650 mg total) by mouth every 6 (six) hours as needed for mild pain., Disp: , Rfl:  .  albuterol (VENTOLIN HFA) 108 (90 Base) MCG/ACT inhaler, Inhale 1 puff into the lungs every 4 (four) hours as needed for wheezing or shortness of breath. , Disp: , Rfl:  .  aspirin EC 81 MG EC tablet, Take 1 tablet (81 mg total) by mouth daily. Swallow whole., Disp: , Rfl:  .  Cholecalciferol (VITAMIN D3) 1.25 MG (50000 UT) CAPS, Take 50,000 Units by mouth once a  week. , Disp: , Rfl:  .  fluticasone (FLONASE) 50 MCG/ACT nasal spray, Place 1 spray into both nostrils daily as needed for rhinitis. , Disp: , Rfl:  .  magnesium oxide (MAG-OX) 400 (241.3 Mg) MG tablet, Take 1 tablet (400 mg total) by mouth 2 (two) times daily., Disp: 30 tablet, Rfl: 0 .  metoprolol tartrate (LOPRESSOR) 25 MG tablet, Take 0.5 tablets (12.5 mg total) by mouth 2 (two) times daily., Disp: 30 tablet, Rfl: 1 .  nicotine (NICODERM CQ - DOSED IN MG/24 HOURS) 21 mg/24hr patch, Place 21 mg onto the skin daily. , Disp: , Rfl:  .  rosuvastatin (CRESTOR) 10 MG tablet, Take 0.5 tablets (5 mg total) by mouth daily., Disp: 30 tablet, Rfl: 2 .  tamsulosin (FLOMAX) 0.4 MG CAPS capsule, Take 0.4 mg by mouth daily. , Disp: , Rfl:  .  traMADol (ULTRAM) 50 MG tablet, Take 1 tablet (50 mg total) by mouth every 6 (six) hours as needed for moderate pain., Disp: 28 tablet, Rfl: 0 .  warfarin (COUMADIN) 2.5 MG tablet, Take 1 tablet (2.5 mg total) by mouth daily at 6 PM. As directed by coumadin clinic at cardiologist office, Disp: 100 tablet, Rfl: 1 Past Medical History:  Diagnosis Date  . Centrilobular emphysema ([place])   . Hyperlipidemia   . Hypertension   . S/P ascending aortic aneurysm repair 04/14/2020  . 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 Severe aortic stenosis   . Thoracic ascending aortic aneurysm ([place])   . Tobacco abuse    ASSESSMENT  Recent Results: The most recent result is correlated with 40 mg per week:  Lab Results  Component Value Date   INR 1.8 (A) 05/10/2020   INR 1.7 (A) 05/04/2020   INR 1.3 (A) 04/30/2020    Anticoagulation Dosing:    INR today: Subtherapeutic. INR continues to stay subtherapeutic despite dose increases. INR responding slowly to current weekly dose. Pt denies any  missed doses. Continues to deny and s/sx of thromboembolic complications. Will continue to increase weekly dose and close monitoring. Reviewed management of bleeding and bruising symptoms.   PLAN Weekly dose was increased by 31.2% to 52.5 mg per week. Take 7.5 mg everyday. Recheck INR in 4 days.   Patient Instructions  INR below goal. Increase weekly dose to 7.5 mg every day. Recheck INR in 6 days.   Patient advised to contact clinic or seek medical attention if signs/symptoms of bleeding or thromboembolism occur.  Patient verbalized understanding by repeating back information and was advised to contact me if further medication-related questions arise.   Follow-up Return in about 4 days (around 05/14/2020).  Alysia Penna, PharmD  15 minutes spent face-to-face with the patient during the encounter. 50% of time spent on education, including signs/sx bleeding and clotting, as well as food and drug interactions with warfarin. 50% of time was spent on fingerprick POC INR sample collection,processing, results determination, and documentation

## 2020-05-11 NOTE — Telephone Encounter (Signed)
Spoke with patient regarding prior message. Advised patient that I have canceled PFT per Dr.Alva. Pateint is aware and voice was understanding. Nothing else further needed.

## 2020-05-13 ENCOUNTER — Encounter: Payer: Self-pay | Admitting: Pulmonary Disease

## 2020-05-13 ENCOUNTER — Ambulatory Visit: Payer: Medicare HMO | Admitting: Pulmonary Disease

## 2020-05-13 ENCOUNTER — Other Ambulatory Visit: Payer: Self-pay

## 2020-05-13 VITALS — BP 128/82 | HR 73 | Temp 98.0°F | Ht 68.0 in | Wt 149.2 lb

## 2020-05-13 DIAGNOSIS — Z9189 Other specified personal risk factors, not elsewhere classified: Secondary | ICD-10-CM

## 2020-05-13 DIAGNOSIS — Z23 Encounter for immunization: Secondary | ICD-10-CM | POA: Diagnosis not present

## 2020-05-13 DIAGNOSIS — R911 Solitary pulmonary nodule: Secondary | ICD-10-CM | POA: Diagnosis not present

## 2020-05-13 DIAGNOSIS — J432 Centrilobular emphysema: Secondary | ICD-10-CM

## 2020-05-13 NOTE — Patient Instructions (Signed)
Flu shot Lung function is at 75%, you have emphysema.  Congratulations on recovering well from surgery and cutting down cigarettes.  You have to try now and quit completely!

## 2020-05-13 NOTE — Progress Notes (Signed)
   Subjective:    Patient ID: Kurt Allen, male    DOB: Jan 05, 1946, 74 y.o.   MRN: [id number]  HPI   74 year old smoker for FU of emphysema and lung nodule on low-dose CT.  He started smoking at age 19, maximum of 2 packs/day, more than 100 pack years and has recently cut down to 2 cigarettes daily with using nicotine patches.  Chief Complaint  Patient presents with  . Follow-up    PFT 8/24. Patient feels good overall, no concerns. Had a heart valve replaced a month ago 8/25   Is low-dose CT showed 5.1 cm ascending aortic aneurysm.  Further evaluation also showed severe aortic stenosis.  He underwent AVR with bioprosthetic valve and TAA repair, he has recovered well.  He is on warfarin. Dyspnea is stable and he reports dyspnea on walking long distances.  He has been able to cut down from 2 packs/day to 1 cigarette/day We reviewed PFTs and low-dose CT follow-up   Significant tests/ events reviewed   PFTs 03/2020-moderate airway obstruction, ratio 54, FEV1 75%, FVC 90%, TLC 1 1 8% and DLCO 62% LDCT 03/2020 >> no nodules noted LDCT 12/2019 >> 8.2 mm irregular opacity anterior right lung apex , 5.1 cm diameter ascending thoracic aorta with dense calcification of the aortic valve  Echo 12/2019-severe AS, peak gradient 44 mm LHC 01/2020 normal coronaries    Review of Systems Patient denies significant dyspnea,cough, hemoptysis,  chest pain, palpitations, pedal edema, orthopnea, paroxysmal nocturnal dyspnea, lightheadedness, nausea, vomiting, abdominal or  leg pains      Objective:   Physical Exam   Gen. Pleasant, well-nourished, in no distress ENT - no thrush, no pallor/icterus,no post nasal drip Neck: No JVD, no thyromegaly, no carotid bruits Lungs: no use of accessory muscles, no dullness to percussion, clear without rales or rhonchi  Cardiovascular: Rhythm regular, heart sounds  normal, no murmurs or gallops, no peripheral edema Musculoskeletal: No deformities, no  cyanosis or clubbing         Assessment & Plan:

## 2020-05-13 NOTE — Assessment & Plan Note (Signed)
Previously noted nodules were benign.  He can continue on annual follow-up low-dose CT, next would be 03/2021

## 2020-05-13 NOTE — Assessment & Plan Note (Signed)
We discussed benefit of using maintenance inhaler.  He feels he does not need this currently and I would be okay with that idea.  He can use albuterol on an as-needed basis. We will reassess him in 6 months to see if his dyspnea has worsened and then can consider starting LABA/LAMA if needed Meanwhile focus on smoking cessation

## 2020-05-14 ENCOUNTER — Other Ambulatory Visit: Payer: Self-pay | Admitting: Thoracic Surgery (Cardiothoracic Vascular Surgery)

## 2020-05-14 ENCOUNTER — Ambulatory Visit: Payer: Medicare HMO | Admitting: Cardiology

## 2020-05-14 ENCOUNTER — Encounter: Payer: Self-pay | Admitting: Cardiology

## 2020-05-14 VITALS — BP 131/78 | HR 69 | Resp 15 | Ht 68.0 in | Wt 149.0 lb

## 2020-05-14 DIAGNOSIS — Z8679 Personal history of other diseases of the circulatory system: Secondary | ICD-10-CM

## 2020-05-14 DIAGNOSIS — Z953 Presence of xenogenic heart valve: Secondary | ICD-10-CM

## 2020-05-14 DIAGNOSIS — E7800 Pure hypercholesterolemia, unspecified: Secondary | ICD-10-CM

## 2020-05-14 DIAGNOSIS — Z5181 Encounter for therapeutic drug level monitoring: Secondary | ICD-10-CM

## 2020-05-14 DIAGNOSIS — R0609 Other forms of dyspnea: Secondary | ICD-10-CM

## 2020-05-14 LAB — POCT INR: INR: 2.3 (ref 2.0–3.0)

## 2020-05-14 MED ORDER — ROSUVASTATIN CALCIUM 5 MG PO TABS: 5.0000 mg | ORAL_TABLET | Freq: Every day | ORAL | 3 refills | Status: DC

## 2020-05-14 MED ORDER — METOPROLOL TARTRATE 25 MG PO TABS: 12.5000 mg | ORAL_TABLET | Freq: Two times a day (BID) | ORAL | 3 refills | Status: DC

## 2020-05-14 NOTE — Progress Notes (Signed)
[ Primary Physician/Referring:  Timoteo Gaul, FNP  Patient ID: Kurt Allen, male    DOB: June 17, 1946, 74 y.o.   MRN: [id number]  Chief Complaint  Patient presents with  . Follow-up    6 week  . Aortic Stenosis   HPI:    Kurt Allen  is a 74 y.o. Caucasian male with hypertension, tobacco use disorder, severe aortic valve stenosis confirmed by cardiac catheterization on 02/17/2020 without significant coronary artery disease, a 5.1 cm ascending aortic aneurysm noted during low-dose CT scan of the chest for lung cancer screening, abnormal lung mass and needs further follow-up.    Patient underwent elective bioprosthetic aortic valve replacement along with aortic root repair on 04/14/2020 and eventually discharged home on 04/21/2020.  He did have brief postoperative atrial fibrillation.  He is presently on warfarin and presents for follow-up.  He has 100+ year history of tobacco use, started smoking when he is 74 years of age and has been smoking about 2 packs cigarettes a day, since he was told to have an aneurysm in May 2021, he has reduced it to one fourth of a pack and is on the verge of quitting, now smoking 2 cigarettes a day.   Except for chronic dyspnea denies palpitations, pain, claudication.  On his last office visit a month ago, I had ordered echocardiogram and carotid artery duplex and he presents for follow-up.  No new symptomatology.  Past Medical History:  Diagnosis Date  . Centrilobular emphysema ([place])   . Hyperlipidemia   . Hypertension   . S/P ascending aortic aneurysm repair 04/14/2020  . 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 Severe aortic stenosis   . Thoracic ascending aortic aneurysm ([place])   . Tobacco abuse    Past Surgical History:  Procedure Laterality Date   . AORTIC VALVE REPLACEMENT N/A 04/14/2020   Procedure: AORTIC VALVE REPLACEMENT (AVR);  Surgeon: Rexene Alberts, MD;  Location: [facility];  Service: Open Heart Surgery;  Laterality: N/A;  . BENTALL PROCEDURE N/A 04/14/2020   Procedure: BENTALL AORTIC ROOT REPLACEMENT USING KONECT RESILIA 25MM AORTIC VALVED CONDUIT WITH REIMPLANTATION OF RIGHT AND LEFT CORONARY BUTTONS;  Surgeon: Rexene Alberts, MD;  Location: [facility];  Service: Open Heart Surgery;  Laterality: N/A;  . EYE SURGERY Bilateral    cataracts  . RIGHT HEART CATH AND CORONARY ANGIOGRAPHY N/A 02/17/2020   Procedure: RIGHT HEART CATH AND CORONARY ANGIOGRAPHY;  Surgeon: Adrian Prows, MD;  Location: [facility];  Service: Cardiovascular;  Laterality: N/A;  . TEE WITHOUT CARDIOVERSION N/A 04/14/2020   Procedure: TRANSESOPHAGEAL ECHOCARDIOGRAM (TEE);  Surgeon: Rexene Alberts, MD;  Location: [facility];  Service: Open Heart Surgery;  Laterality: N/A;  . THORACIC AORTIC ANEURYSM REPAIR N/A 04/14/2020   Procedure: THORACIC ASCENDING ANEURYSM REPAIR (AAA);  Surgeon: Rexene Alberts, MD;  Location: [facility];  Service: Open Heart Surgery;  Laterality: N/A;   History reviewed. No pertinent family history.  Social History   Tobacco Use  . Smoking status: Current Some Day Smoker    Packs/day: 0.25    Years: 50.00    Pack years: 12.50    Types: Cigarettes  . Smokeless tobacco: Never Used  . Tobacco comment: 1 a day  Substance Use Topics  . Alcohol use: Yes    Alcohol/week: 10.0 standard drinks  Types: 10 Cans of beer per week    Comment: per week   Marital Status: Divorced  ROS  Review of Systems  Cardiovascular: Positive for dyspnea on exertion. Negative for chest pain and leg swelling.  Gastrointestinal: Negative for melena.   Objective  Blood pressure 131/78, pulse 69, resp. rate 15, height _0  (1.727 m), weight 149 lb (67.6 kg), SpO2 99 %.  Vitals with BMI 05/14/2020 05/13/2020 04/21/2020  Height _1  _2  -  Weight 149 lbs 149 lbs  3 oz 153 lbs 11 oz  BMI 22.66 20.23 34.35  Systolic 686 168 -  Diastolic 78 82 -  Pulse 69 73 76     Physical Exam Constitutional:      Appearance: He is well-developed.  Cardiovascular:     Rate and Rhythm: Normal rate.     Pulses:          Carotid pulses are on the left side with bruit.      Femoral pulses are 2+ on the right side.      Popliteal pulses are 2+ on the right side and 2+ on the left side.       Dorsalis pedis pulses are 1+ on the right side and 2+ on the left side.       Posterior tibial pulses are 2+ on the right side and 2+ on the left side.     Heart sounds: Normal heart sounds. No murmur heard.  No gallop.      Comments: S1 is normal, S2 is muffled. Pulmonary:     Effort: Pulmonary effort is normal.     Breath sounds: Normal breath sounds.  Abdominal:     General: Bowel sounds are normal.     Palpations: Abdomen is soft.    Laboratory examination:   Recent Labs    04/18/20 0447 04/19/20 0158 04/20/20 0140  NA 133* 128* 132*  K 3.3* 3.9 3.7  CL 101 90* 92*  CO2 _3 GLUCOSE 97 109* 113*  BUN _4 CREATININE 0.71 0.93 1.03  CALCIUM 6.9* 8.4* 8.5*  GFRNONAA >60 >60 >60  GFRAA >60 >60 >60   CrCl cannot be calculated (Patient's most recent lab result is older than the maximum 21 days allowed.).  CMP Latest Ref Rng & Units 04/20/2020 04/19/2020 04/18/2020  Glucose 70 - 99 mg/dL 113(H) 109(H) 97  BUN 8 - 23 mg/dL _5 Creatinine 0.61 - 1.24 mg/dL 1.03 0.93 0.71  Sodium 135 - 145 mmol/L 132(L) 128(L) 133(L)  Potassium 3.5 - 5.1 mmol/L 3.7 3.9 3.3(L)  Chloride 98 - 111 mmol/L 92(L) 90(L) 101  CO2 22 - 32 mmol/L _6 Calcium 8.9 - 10.3 mg/dL 8.5(L) 8.4(L) 6.9(L)  Total Protein 6.5 - 8.1 g/dL - - -  Total Bilirubin 0.3 - 1.2 mg/dL - - -  Alkaline Phos 38 - 126 U/L - - -  AST 15 - 41 U/L - - -  ALT 0 - 44 U/L - - -   CBC Latest Ref Rng & Units 04/19/2020 04/18/2020 04/17/2020  WBC 4.0 - 10.5 K/uL 4.8 5.0 7.4  Hemoglobin 13.0 - 17.0  g/dL 10.4(L) 9.9(L) 11.0(L)  Hematocrit 39 - 52 % 31.2(L) 29.3(L) 32.7(L)  Platelets 150 - 400 K/uL 196 163 117(L)   Lipid Panel     Component Value Date/Time   CHOL 133 02/02/2020 0956   TRIG 57 02/02/2020 0956   HDL 69 02/02/2020 0956  LDLCALC 52 02/02/2020 0956   HEMOGLOBIN A1C Lab Results  Component Value Date   HGBA1C 5.3 04/12/2020   MPG 105 04/12/2020   TSH No results for input(s): TSH in the last 8760 hours.  External labs:  Recent labs:  10/28/2019: Glucose 99, BUN/Cr 12/1.05. EGFR 71. Na/K 135/4.6. Rest of the CMP normal  H/H 16.4/48.6. MCV 91.2. Platelets 240  HbA1C 5.2%  Chol 179, TG 72, HDL 59, LDL 104  TSH 1.39 normal  Medications and allergies   No Known Allergies   Current Outpatient Medications  Medication Instructions  . acetaminophen (TYLENOL) 650 mg, Oral, Every 6 hours PRN  . albuterol (VENTOLIN HFA) 108 (90 Base) MCG/ACT inhaler 1 puff, Inhalation, Every 4 hours PRN  . aspirin 81 mg, Oral, Daily, Swallow whole.  . fluticasone (FLONASE) 50 MCG/ACT nasal spray 1 spray, Each Nare, Daily PRN  . magnesium oxide (MAG-OX) 400 mg, Oral, 2 times daily  . metoprolol tartrate (LOPRESSOR) 12.5 mg, Oral, 2 times daily  . nicotine (NICODERM CQ - DOSED IN MG/24 HOURS) 21 mg, Transdermal, Daily  . rosuvastatin (CRESTOR) 5 mg, Oral, Daily  . tamsulosin (FLOMAX) 0.4 mg, Oral, Daily  . traMADol (ULTRAM) 50 mg, Oral, Every 6 hours PRN  . Vitamin D3 50,000 Units, Oral, Weekly  . warfarin (COUMADIN) 2.5 mg, Oral, Daily-1800, As directed by coumadin clinic at cardiologist office   Radiology:   CT Chest Lung 12/25/2019: 1. 8.2 mm irregular opacity anterior right lung apex, potentially scarring. Lung-RADS 4A, suspicious. Follow up low-dose chest CT without contrast in 3 months (please use the following order, "CT CHEST LCS NODULE FOLLOW-UP W/O CM") is recommended.  Alternatively, PET may be considered when there is a solid component 92m or larger. 2. 5.1 cm  diameter ascending thoracic aorta with dense calcification of the aortic valve. Aortic stenosis would be a consideration. Ascending thoracic aortic aneurysm. Recommend semi-annual imaging followup by CTA or MRA and referral to cardiothoracic surgery if not already obtained. This recommendation follows 2010 ACCF/AHA/AATS/ACR/ASA/SCA/SCAI/SIR/STS/SVM Guidelines for the Diagnosis and Management of Patients With Thoracic Aortic Disease. Circulation. 2010; 121:: G956-O130   3. Emphysema (ICD10-J43.9) and Aortic Atherosclerosis.  Cardiac Studies:   Abdominal Aortic Duplex 01/13/2020:  The maximum aorta (sac) diameter is 2.22 cm (mid). Mild ectasia noted.  Diffuse plaque observed in the proximal, mid and distal aorta. Normal flow  velocities noted in the aorta and iliac arteries.  No AAA noted. Consider recheck in 10 years for progression to AAA.  Carotid artery duplex 086/57/8469  Peak systolic velocities in the right bifurcation, internal, external and  common carotid arteries are within normal limits.  Stenosis in the left internal carotid artery (16-49%). However left vessel  is tortuous, bruit and elevated velocity may be related to geometry.  Antegrade right vertebral artery flow. Antegrade left vertebral artery  Flow.  Echocardiogram 01/13/2020:  Left ventricle cavity is normal in size. Moderate concentric hypertrophy  of the left ventricle. Normal global wall motion. Normal LV systolic  function with visual EF 50-55%. Indeterminate diastolic filling pattern.  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 Severe  annular and leaflet calcification.  Severe aortic stenosis. Aortic valve mean gradient of 44 mmHg, Vmax of 4.1  m/s. Calculated aortic valve area by continuity equation is 0.5 cm.  Mild (Grade I) aortic regurgitation.  Mild (Grade I) mitral regurgitation.  Inadequate TR jet to estimate pulmonary artery systolic pressure. Normal  right atrial pressure.  Carotid  artery duplex 01/13/2020:  Peak  systolic velocities in the right bifurcation, internal, external and common carotid arteries are within normal limits.  Stenosis in the left internal carotid artery (16-49%). However left vessel is tortuous, bruit and elevated velocity may be related to geometry.  Antegrade right vertebral artery flow. Antegrade left vertebral artery flow.  Follow up in one year is appropriate if clinically indicated.  ABI 02/12/2020:  This exam reveals normal perfusion of the lower extremity (Bilateral ABI  1.00) with normal triphasic waveform pattern.   Left and right heart catheterization 02/17/2020: Normal coronary arteries with right dominant circulation. Normal right heart cardiac catheterization, preserved cardiac output and cardiac index at 4.22 and 2.29 respectively.  Recommendation: Patient will need aortic valve replacement for severe aortic stenosis, also has ascending aortic aneurysm needs to be addressed.  Will make referral for surgical team to evaluate  EKG   EKG 05/14/2020: Normal sinus rhythm at rate of 70 bpm, normal axis.  No evidence of ischemia, normal EKG.  EKG 01/07/2020: Normal sinus rhythm at rate of 70 bpm, left atrial enlargement, otherwise normal EKG.    Assessment     ICD-10-CM   1. S/P aortic valve replacement with bioprosthetic valve  Z95.3   2. Monitoring for long-term anticoagulant use  Z51.81    Z79.01   3. Ascending aortic aneurysm (HCC)  I71.2     No orders of the defined types were placed in this encounter.   There are no discontinued medications.  Recommendations:   Kurt Allen  is a  74 y.o. Caucasian male with hypertension, tobacco use disorder, severe aortic valve stenosis confirmed by cardiac catheterization on 02/17/2020 without significant coronary artery disease, a 5.1 cm ascending aortic aneurysm noted during low-dose CT scan of the chest for lung cancer screening, abnormal lung mass and has f/u scans and review  by Dr. Elsworth Soho.  Caucasian male with hypertension, tobacco use disorder, severe aortic valve stenosis confirmed by cardiac catheterization on 02/17/2020 without significant coronary artery disease, a 5.1 cm ascending aortic aneurysm noted during low-dose CT scan of the chest for lung cancer screening, abnormal lung mass and needs further follow-up.    Patient underwent elective bioprosthetic aortic valve replacement along with aortic root repair on 04/14/2020 and eventually discharged home on 04/21/2020.  He did have brief postoperative atrial fibrillation.  He is presently on warfarin and presents for follow-up.  He has recuperated well, today he is in sinus rhythm, blood pressure is well controlled and remains asymptomatic.  No changes in the medications were done today.  I reviewed his records, he has started to smoke about 1 cigarette a day which I advised him to see if he can completely give up and remain abstinent.  He appears to be motivated.  I will continue anticoagulation for now and probably consider discontinuing this after 2 to 3 months after discussions with Dr. Roxy Manns.    Adrian Prows, MD, [facility] 05/14/2020, 12:27 PM Office: [phone]   CC: Lilly Cove, MD

## 2020-05-14 NOTE — Patient Instructions (Signed)
INR at goal. Continue current weekly dose of 7.5 mg every day. Recheck INR in 11 days.

## 2020-05-17 ENCOUNTER — Other Ambulatory Visit: Payer: Self-pay

## 2020-05-17 ENCOUNTER — Encounter: Payer: Self-pay | Admitting: Thoracic Surgery (Cardiothoracic Vascular Surgery)

## 2020-05-17 ENCOUNTER — Ambulatory Visit (INDEPENDENT_AMBULATORY_CARE_PROVIDER_SITE_OTHER): Payer: Self-pay | Admitting: Thoracic Surgery (Cardiothoracic Vascular Surgery)

## 2020-05-17 ENCOUNTER — Ambulatory Visit
Admission: RE | Admit: 2020-05-17 | Discharge: 2020-05-17 | Disposition: A | Discharge status: Home / Self Care | Payer: Medicare HMO | Source: Ambulatory Visit | Attending: Thoracic Surgery (Cardiothoracic Vascular Surgery) | Admitting: Thoracic Surgery (Cardiothoracic Vascular Surgery)

## 2020-05-17 VITALS — BP 155/90 | HR 64 | Temp 97.6°F | Resp 20 | Ht 68.0 in | Wt 147.0 lb

## 2020-05-17 DIAGNOSIS — Z8679 Personal history of other diseases of the circulatory system: Secondary | ICD-10-CM

## 2020-05-17 DIAGNOSIS — I7121 Aneurysm of the ascending aorta, without rupture: Secondary | ICD-10-CM

## 2020-05-17 DIAGNOSIS — I712 Thoracic aortic aneurysm, without rupture: Secondary | ICD-10-CM

## 2020-05-17 DIAGNOSIS — I350 Nonrheumatic aortic (valve) stenosis: Secondary | ICD-10-CM

## 2020-05-17 DIAGNOSIS — Z953 Presence of xenogenic heart valve: Secondary | ICD-10-CM

## 2020-05-17 DIAGNOSIS — Z9889 Other specified postprocedural states: Secondary | ICD-10-CM

## 2020-05-17 NOTE — Progress Notes (Signed)
[address]       [place] [address]             [phone]     CARDIOTHORACIC SURGERY OFFICE NOTE  Referring Provider is Adrian Prows, MD PCP is Timoteo Gaul, FNP   HPI:  Patient is a 74 year old male with longstanding history of a heart murmur, tobacco abuse, and severe COPD who returns the office today for routine follow-up status post biological Bentall aortic root replacement using a stented bovine pericardial tissue valve conduit on April 14, 2020 for bicuspid aortic valve with severe symptomatic aortic stenosis and fusiform aneurysmal enlargement of the aortic root and ascending thoracic aorta.  His early postoperative recovery in the hospital was uncomplicated although notable for the development of recurrent paroxysmal atrial fibrillation for which he was treated with amiodarone and anticoagulated using warfarin.  He was discharged from the hospital in sinus rhythm on the seventh postoperative day.  Since hospital discharge his Coumadin dose has been monitored and adjusted at Dr. Irven Shelling office, and last week he was seen in follow-up by Dr. Einar Gip who noted that he remained in sinus rhythm with no evidence of recurrent atrial fibrillation.  Patient returns to our office today and reports that he is doing well.  He has minimal residual soreness in his chest and is not taking any sort of pain relievers.  He has not had any exertional chest pain or chest tightness.  He denies any palpitations or other symptoms to suggest a recurrence of atrial fibrillation.  Appetite is good.  He admits that he has not been walking much and he also admits that he has been smoking 1 or 2 cigarettes daily.   Current Outpatient Medications  Medication Sig Dispense Refill  . acetaminophen (TYLENOL) 325 MG tablet Take 2 tablets (650 mg total) by mouth every 6 (six) hours as needed for mild pain.    Marland Kitchen albuterol (VENTOLIN HFA) 108 (90 Base) MCG/ACT inhaler Inhale 1 puff into the lungs  every 4 (four) hours as needed for wheezing or shortness of breath.     Marland Kitchen aspirin EC 81 MG EC tablet Take 1 tablet (81 mg total) by mouth daily. Swallow whole.    . Cholecalciferol (VITAMIN D3) 1.25 MG (50000 UT) CAPS Take 50,000 Units by mouth once a week.     . fluticasone (FLONASE) 50 MCG/ACT nasal spray Place 1 spray into both nostrils daily as needed for rhinitis.     . magnesium oxide (MAG-OX) 400 (241.3 Mg) MG tablet Take 1 tablet (400 mg total) by mouth 2 (two) times daily. 30 tablet 0  . metoprolol tartrate (LOPRESSOR) 25 MG tablet Take 0.5 tablets (12.5 mg total) by mouth 2 (two) times daily. 90 tablet 3  . nicotine (NICODERM CQ - DOSED IN MG/24 HOURS) 21 mg/24hr patch Place 21 mg onto the skin daily.     . rosuvastatin (CRESTOR) 5 MG tablet Take 1 tablet (5 mg total) by mouth daily. 90 tablet 3  . tamsulosin (FLOMAX) 0.4 MG CAPS capsule Take 0.4 mg by mouth daily.     . traMADol (ULTRAM) 50 MG tablet Take 1 tablet (50 mg total) by mouth every 6 (six) hours as needed for moderate pain. 28 tablet 0  . warfarin (COUMADIN) 2.5 MG tablet Take 1 tablet (2.5 mg total) by mouth daily at 6 PM. As directed by coumadin clinic at cardiologist office 100 tablet 1   No current facility-administered medications for this visit.  Physical Exam:   BP (!) 155/90   Pulse 64   Temp 97.6 F (36.4 C) (Skin)   Resp 20   Ht 5\' 8"  (1.727 m)   Wt 147 lb (66.7 kg)   SpO2 90% Comment: RA  BMI 22.35 kg/m   General:  Well-appearing  Chest:   Distant breath sounds but clear  CV:   Regular rate and rhythm without murmur  Incisions:  Healing nicely, sternum is stable  Abdomen:  Soft nontender  Extremities:  Warm and well-perfused  Diagnostic Tests:  CHEST - 2 VIEW  COMPARISON:  April 09, 2020  FINDINGS: Suspected bullous disease in the upper lobes. No edema or airspace opacity. Heart size and pulmonary vascularity are normal. Status post aortic valve replacement. No mediastinal widening.  There is aortic atherosclerosis. No demonstrable adenopathy. No bone lesions. 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 No edema or airspace opacity. Heart size normal. Status post aortic valve replacement.  Aortic Atherosclerosis (ICD10-I70.0).   Electronically Signed   By: Lowella Grip III M.D.   On: 05/17/2020 10:08   Impression:  Patient is doing very well approximately 1 month status post Bentall aortic root replacement using a bioprosthetic tissue valve conduit for bicuspid aortic valve with severe symptomatic aortic stenosis with aneurysm involving the aortic root and proximal ascending thoracic aorta.  Clinically the patient looks very good.  Unfortunately he has already started smoking again.  He appears to be maintaining sinus rhythm  Plan:  I have strongly encouraged the patient to find a way to quit smoking.  We have not recommended any changes to the patient's current medications.  I think it would be reasonable to stop warfarin anticoagulation within the next 2 months and is long as he continues to maintain sinus rhythm.  When he stops warfarin he should go back on low-dose aspirin.  I have encouraged the patient to continue to increase his physical activity with his only limitation remaining that he refrain from heavy lifting or strenuous use of his arms or shoulders for at least another 2 months.  I think he may resume driving an automobile and I have strongly encouraged the patient to enroll and participate in the cardiac rehab program.  The patient has been reminded regarding the importance of dental hygiene and the lifelong need for antibiotic prophylaxis for all dental cleanings and other related invasive procedures.  All of his questions have been addressed.   Valentina Gu. Roxy Manns, MD 05/17/2020 10:49 AM

## 2020-05-17 NOTE — Patient Instructions (Addendum)
Continue all previous medications without any changes at this time  Schedule follow up echocardiogram at Dr Irven Shelling office as soon as practical  Stop smoking immediately and permanently.  Continue to avoid any heavy lifting or strenuous use of your arms or shoulders for at least a total of three months from the time of surgery.  After three months you may gradually increase how much you lift or otherwise use your arms or chest as tolerated, with limits based upon whether or not activities lead to the return of significant discomfort.  You may return to driving an automobile as long as you are no longer requiring oral narcotic pain relievers during the daytime.  It would be wise to start driving only short distances during the daylight and gradually increase from there as you feel comfortable.  You are encouraged to enroll and participate in the outpatient cardiac rehab program beginning as soon as practical.  Endocarditis is a potentially serious infection of heart valves or inside lining of the heart.  It occurs more commonly in patients with diseased heart valves (such as patient's with aortic or mitral valve disease) and in patients who have undergone heart valve repair or replacement.  Certain surgical and dental procedures may put you at risk, such as dental cleaning, other dental procedures, or any surgery involving the respiratory, urinary, gastrointestinal tract, gallbladder or prostate gland.   To minimize your chances for develooping endocarditis, maintain good oral health and seek prompt medical attention for any infections involving the mouth, teeth, gums, skin or urinary tract.    Always notify your doctor or dentist about your underlying heart valve condition before having any invasive procedures. You will need to take antibiotics before certain procedures, including all routine dental cleanings or other dental procedures.  Your cardiologist or dentist should prescribe these  antibiotics for you to be taken ahead of time.

## 2020-05-25 ENCOUNTER — Other Ambulatory Visit: Payer: Self-pay | Admitting: Pharmacist

## 2020-05-25 ENCOUNTER — Other Ambulatory Visit: Payer: Self-pay

## 2020-05-25 ENCOUNTER — Ambulatory Visit: Payer: Medicare HMO | Admitting: Pharmacist

## 2020-05-25 DIAGNOSIS — Z7901 Long term (current) use of anticoagulants: Secondary | ICD-10-CM

## 2020-05-25 DIAGNOSIS — Z953 Presence of xenogenic heart valve: Secondary | ICD-10-CM

## 2020-05-25 LAB — POCT INR: INR: 3 (ref 2.0–3.0)

## 2020-05-25 NOTE — Progress Notes (Signed)
Increase green intake to 2-3 serving of salad/week   Anticoagulation Management Kurt Allen is a 74 y.o. male who reports to the clinic for monitoring of warfarin treatment.    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 Started on warfarin therapy on 04/19/20. INR goal of 2-3 per Dr. Roxy Manns. Episodes of Afib while admitted and pt was started on ammio drip in patient. NSR before discharge. Pt denies any complains of CP, palpitations, dizziness, lightheadedness since being discharged.   Patient does not report signs/symptoms of bleeding or thromboembolism   Other recent changes: No change in diet, medications, lifestyle.   Per Dr. 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  acetaminophen (TYLENOL) 325 MG tablet, Take 2 tablets (650 mg total) by mouth every 6 (six) hours as needed for mild pain., Disp: , Rfl:  .  albuterol (VENTOLIN HFA) 108 (90 Base) MCG/ACT inhaler, Inhale 1 puff into the lungs every 4 (four) hours as needed for wheezing or shortness of breath. , Disp: , Rfl:  .  aspirin EC 81 MG EC tablet, Take 1 tablet (81 mg total) by mouth daily. Swallow whole.,  Disp: , Rfl:  .  Cholecalciferol (VITAMIN D3) 1.25 MG (50000 UT) CAPS, Take 50,000 Units by mouth once a week. , Disp: , Rfl:  .  fluticasone (FLONASE) 50 MCG/ACT nasal spray, Place 1 spray into both nostrils daily as needed for rhinitis. , Disp: , Rfl:  .  magnesium oxide (MAG-OX) 400 (241.3 Mg) MG tablet, Take 1 tablet (400 mg total) by mouth 2 (two) times daily., Disp: 30 tablet, Rfl: 0 .  metoprolol tartrate (LOPRESSOR) 25 MG tablet, Take 0.5 tablets (12.5 mg total) by mouth 2 (two) times daily., Disp: 90 tablet, Rfl: 3 .  nicotine (NICODERM CQ - DOSED IN MG/24 HOURS) 21 mg/24hr patch, Place 21 mg onto the skin daily. , Disp: , Rfl:  .  rosuvastatin (CRESTOR) 5 MG tablet, Take 1 tablet (5 mg total) by mouth daily., Disp: 90 tablet, Rfl: 3 .  tamsulosin (FLOMAX) 0.4 MG CAPS capsule, Take 0.4 mg by mouth daily. , Disp: , Rfl:  .  traMADol (ULTRAM) 50 MG tablet, Take 1 tablet (50 mg total) by mouth every 6 (six) hours as needed for moderate pain., Disp: 28 tablet, Rfl: 0 .  warfarin (COUMADIN) 2.5 MG tablet, Take 1 tablet (2.5 mg total) by mouth daily at 6 PM. As directed by coumadin clinic at cardiologist office, Disp: 100 tablet, Rfl: 1 Past Medical History:  Diagnosis Date  . Centrilobular emphysema ([place])   . Hyperlipidemia   . Hypertension   . S/P ascending aortic  aneurysm repair 04/14/2020  . 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 Severe aortic stenosis   . Thoracic ascending aortic aneurysm ([place])   . Tobacco abuse    ASSESSMENT  Recent Results: The most recent result is correlated with 52.5 mg per week:  Lab Results  Component Value Date   INR 3.0 05/25/2020   INR 2.3 05/14/2020   INR 1.8 (A) 05/10/2020    Anticoagulation Dosing:    INR today: Therapeutic. INR continues to remain  therapeutic on the current dose. Denies any complains of bleeding or bruising symptoms. Denies any other relevant changes in her diet, medications, or lifestyle. Pt interested in adding 2-3 servings of salads per week. Will continue to monitoring closely in setting of slight increase of Vit K intake.   PLAN Weekly dose was unchanged by 0% to 52.5 mg per week. Take 7.5 mg everyday. Recheck INR in 2 weeks   Patient Instructions  INR at goal. Continue current weekly dose of 7.5 mg every day. Recheck INR in 2 weeks.   Patient advised to contact clinic or seek medical attention if signs/symptoms of bleeding or thromboembolism occur.  Patient verbalized understanding by repeating back information and was advised to contact me if further medication-related questions arise.   Follow-up Return in about 2 weeks (around 06/08/2020).  Alysia Penna, PharmD  15 minutes spent face-to-face with the patient during the encounter. 50% of time spent on education, including signs/sx bleeding and clotting, as well as food and drug interactions with warfarin. 50% of time was spent on fingerprick POC INR sample collection,processing, results determination, and documentation

## 2020-05-25 NOTE — Progress Notes (Signed)
Cardiac rehab order placed per Dr. Roxy Manns.

## 2020-05-25 NOTE — Patient Instructions (Signed)
INR at goal. Continue current weekly dose of 7.5 mg everyday. Recheck INR in 2 weeks.  °

## 2020-05-26 NOTE — Telephone Encounter (Signed)
Pt s/p Biological BentallAorticRootReplacement on 04/14/20 with post-operative episodes of AFib. Started on warfarin therapy on 04/19/20. Continues to remains on NSR per recent OV EKG on 05/14/20.  Pt tolerating warfarin therapy well and continues to be remain therapeutic. Pt requesting refill for warfarin. Pt currently has another 1 week of warfarin tablets left. Discussed with Dr. Einar Gip. Per Dr. Einar Gip, okay for pt to finish off his current supply of warfarin tablets and to discontinue warfarin anticoagulation afterwards. Called and reviewed recommendation with pt. Pt verbalized understanding.

## 2020-06-25 ENCOUNTER — Telehealth (HOSPITAL_COMMUNITY): Payer: Self-pay

## 2020-06-25 NOTE — Telephone Encounter (Signed)
Called patient to see if he was interested in participating in the Cardiac Rehab Program. Patient stated yes. Patient will come in for orientation on 11/30 @ 10AM and will attend the 1:15PM exercise class.  Tourist information centre manager.

## 2020-07-13 ENCOUNTER — Other Ambulatory Visit: Payer: Self-pay

## 2020-07-13 ENCOUNTER — Encounter (HOSPITAL_COMMUNITY)
Admission: RE | Admit: 2020-07-13 | Discharge: 2020-07-13 | Disposition: A | Discharge status: Home / Self Care | Payer: Medicare HMO | Source: Ambulatory Visit | Attending: Cardiology | Admitting: Cardiology

## 2020-07-13 DIAGNOSIS — Z9889 Other specified postprocedural states: Secondary | ICD-10-CM | POA: Insufficient documentation

## 2020-07-13 DIAGNOSIS — Z8679 Personal history of other diseases of the circulatory system: Secondary | ICD-10-CM

## 2020-07-13 DIAGNOSIS — Z953 Presence of xenogenic heart valve: Secondary | ICD-10-CM

## 2020-07-13 NOTE — Progress Notes (Signed)
Cardiac Rehab Telephone Note:  Successful telephone encounter to Clive to confirm Cardiac Rehab orientation appointment for 07/20/20 at 10:00 am. Nursing assessment completed. Patient questions answered. Instructions for appointment provided. Patient screening for Covid-19 negative.  Einer Meals E. Rollene Rotunda RN, BSN [place]. [facility]  Cardiac and Pulmonary Rehabilitation Phone: [phone] Fax: [phone]

## 2020-07-14 ENCOUNTER — Ambulatory Visit: Payer: Medicare HMO | Admitting: Cardiology

## 2020-07-14 ENCOUNTER — Telehealth (HOSPITAL_COMMUNITY): Payer: Self-pay

## 2020-07-18 NOTE — Telephone Encounter (Signed)
Cardiac Rehab Medication Review by a Pharmacist  Does the patient  feel that his/her medications are working for him/her?  yes  Has the patient been experiencing any side effects to the medications prescribed?  no  Does the patient measure his/her own blood pressure or blood glucose at home?  yes - the patient takes their blood pressure at home and that their readings average around 120/80  Does the patient have any problems obtaining medications due to transportation or finances?   no  Understanding of regimen: good Understanding of indications: good Potential of compliance: good   Pharmacist Intervention: The patient stated that they are ready to quit smoking but the nicotine patches made his arm itchy. I counseled the patient on the other nicotine replacement therapy options and he stated that he would be amenable to trying the lozenges or gum. I informed the patient that the nicotine gum has specific chew-and-park instructions. The patient verbalized understanding.   Shauna Hugh, PharmD, [place]  PGY-1 Pharmacy Resident 07/18/2020 4:09 PM

## 2020-07-19 ENCOUNTER — Encounter: Payer: Self-pay | Admitting: Thoracic Surgery (Cardiothoracic Vascular Surgery)

## 2020-07-19 ENCOUNTER — Other Ambulatory Visit: Payer: Self-pay

## 2020-07-19 ENCOUNTER — Ambulatory Visit: Payer: Medicare HMO | Admitting: Thoracic Surgery (Cardiothoracic Vascular Surgery)

## 2020-07-19 VITALS — BP 139/78 | HR 63 | Temp 98.1°F | Resp 18 | Ht 68.0 in | Wt 155.4 lb

## 2020-07-19 DIAGNOSIS — Z953 Presence of xenogenic heart valve: Secondary | ICD-10-CM

## 2020-07-19 DIAGNOSIS — Z9889 Other specified postprocedural states: Secondary | ICD-10-CM | POA: Diagnosis not present

## 2020-07-19 DIAGNOSIS — Z8679 Personal history of other diseases of the circulatory system: Secondary | ICD-10-CM | POA: Diagnosis not present

## 2020-07-19 NOTE — Progress Notes (Signed)
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  His early postoperative recovery was notable for postoperative atrial fibrillation for which he was treated with amiodarone and anticoagulated using warfarin.  He was last seen here in our office on May 17, 2020.  He has maintained sinus rhythm since hospital discharge and has been taken off of both amiodarone and warfarin.  He returns her office today and reports that he is doing very well.  He no longer has any significant soreness in his chest.  He states that he only gets short of breath if he really pushes himself physically and overall he is back doing most of what he wants to do.  He plans to start the cardiac rehab program later this week.  Overall he is pleased with his progress.   Current Outpatient Medications  Medication Sig Dispense Refill  . albuterol (VENTOLIN HFA) 108 (90 Base) MCG/ACT inhaler Inhale 1 puff into the lungs every 4 (four) hours as needed for wheezing or shortness of breath.     Marland Kitchen aspirin EC 81 MG EC tablet Take 1 tablet (81 mg total) by mouth daily. Swallow whole.    . Cholecalciferol (VITAMIN D3) 1.25 MG (50000 UT) CAPS Take 50,000 Units by mouth once a week.     . magnesium oxide (MAG-OX) 400 (241.3 Mg) MG tablet Take 1 tablet (400 mg total) by mouth 2 (two) times daily. 30 tablet 0  . metoprolol tartrate (LOPRESSOR) 25 MG  tablet Take 0.5 tablets (12.5 mg total) by mouth 2 (two) times daily. 90 tablet 3  . nicotine (NICODERM CQ - DOSED IN MG/24 HOURS) 21 mg/24hr patch Place 21 mg onto the skin daily.     . rosuvastatin (CRESTOR) 5 MG tablet Take 1 tablet (5 mg total) by mouth daily. 90 tablet 3  . tamsulosin (FLOMAX) 0.4 MG CAPS capsule Take 0.4 mg by mouth daily.     Marland Kitchen acetaminophen (TYLENOL) 325 MG tablet Take 2 tablets (650 mg total) by mouth every 6 (six) hours as needed for mild pain. (Patient not taking: Reported on 07/19/2020)    . fluticasone (FLONASE) 50 MCG/ACT nasal spray Place 1 spray into both nostrils daily as needed for rhinitis.  (Patient not taking: Reported on 07/19/2020)     No current facility-administered medications for this visit.      Physical Exam:   BP 139/78 (BP Location: Right Arm, Patient Position: Sitting, Cuff Size: Normal)   Pulse 63   Temp 98.1 F (36.7 C) (Skin)   Resp 18   Ht 5\' 8"  (1.727 m)   Wt 155 lb 6.4 oz (70.5 kg)   SpO2 96% Comment: RA  BMI 23.63 kg/m   General:  Well-appearing  Chest:   Clear to auscultation  CV:   Regular rate and rhythm without murmur  Incisions:  Well-healed, sternum stable  Abdomen:  Soft nontender  Extremities:  Warm and well-perfused  Diagnostic Tests:  n/a   Impression:  Patient is doing well approximately 3 months status post aortic root replacement with bioprosthetic tissue valve and synthetic root conduit for bicuspid aortic valve disease with severe symptomatic aortic stenosis and aneurysmal enlargement of the aortic root and proximal aorta.  Plan:  I have reminded the patient that he needs to find a way to quit smoking.  We have not recommended any changes to his current medications.  He will continue to follow-up closely with Dr. Einar Gip.  We anticipate that he will undergo routine follow-up echocardiogram later this month.  Patient may return to unrestricted physical activity.  All of his questions have been addressed.   Patient will continue to follow-up with Dr. Einar Gip and return to our office next summer, approximately 1 year following his surgery.  He will call return sooner should specific problems or questions arise.  I spent in excess of 15 minutes during the conduct of this office consultation and >50% of this time involved direct face-to-face encounter with the patient for counseling and/or coordination of their care.    Valentina Gu. Roxy Manns, MD 07/19/2020 10:41 AM

## 2020-07-19 NOTE — Patient Instructions (Signed)
Stop smoking immediately and permanently.  Continue all previous medications without any changes at this time  You may resume unrestricted physical activity without any particular limitations at this time.  Endocarditis is a potentially serious infection of heart valves or inside lining of the heart.  It occurs more commonly in patients with diseased heart valves (such as patient's with aortic or mitral valve disease) and in patients who have undergone heart valve repair or replacement.  Certain surgical and dental procedures may put you at risk, such as dental cleaning, other dental procedures, or any surgery involving the respiratory, urinary, gastrointestinal tract, gallbladder or prostate gland.   To minimize your chances for develooping endocarditis, maintain good oral health and seek prompt medical attention for any infections involving the mouth, teeth, gums, skin or urinary tract.    Always notify your doctor or dentist about your underlying heart valve condition before having any invasive procedures. You will need to take antibiotics before certain procedures, including all routine dental cleanings or other dental procedures.  Your cardiologist or dentist should prescribe these antibiotics for you to be taken ahead of time.

## 2020-07-20 ENCOUNTER — Encounter (HOSPITAL_COMMUNITY)
Admission: RE | Admit: 2020-07-20 | Discharge: 2020-07-20 | Disposition: A | Discharge status: Home / Self Care | Payer: Medicare HMO | Source: Ambulatory Visit | Attending: Cardiology | Admitting: Cardiology

## 2020-07-20 ENCOUNTER — Encounter (HOSPITAL_COMMUNITY): Payer: Self-pay

## 2020-07-20 VITALS — BP 118/74 | Ht 66.25 in | Wt 153.7 lb

## 2020-07-20 DIAGNOSIS — Z8679 Personal history of other diseases of the circulatory system: Secondary | ICD-10-CM

## 2020-07-20 DIAGNOSIS — Z9889 Other specified postprocedural states: Secondary | ICD-10-CM

## 2020-07-20 DIAGNOSIS — Z953 Presence of xenogenic heart valve: Secondary | ICD-10-CM

## 2020-07-20 NOTE — Progress Notes (Signed)
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  acetaminophen (TYLENOL) 325 MG tablet, Take 2 tablets (650 mg total) by mouth every 6 (six) hours as needed for mild pain. (Patient not taking: Reported on 07/19/2020), Disp: , Rfl:  .  albuterol (VENTOLIN HFA) 108 (90 Base) MCG/ACT inhaler, Inhale 1 puff into the lungs every 4 (four) hours as needed for wheezing or shortness of breath. , Disp: , Rfl:  .  aspirin EC 81 MG EC tablet, Take 1 tablet (81 mg total) by mouth daily. Swallow whole., Disp: , Rfl:  .  Cholecalciferol (VITAMIN D3) 1.25 MG (50000 UT) CAPS, Take 50,000 Units by mouth once a week. , Disp: , Rfl:  .  fluticasone (FLONASE) 50 MCG/ACT nasal spray, Place 1 spray into both nostrils daily as needed for rhinitis.  (Patient not taking: Reported on 07/19/2020), Disp: , Rfl:  .  magnesium oxide (MAG-OX) 400 (241.3 Mg) MG tablet, Take 1 tablet (400 mg total) by mouth 2 (two) times daily., Disp: 30 tablet, Rfl: 0 .  metoprolol tartrate (LOPRESSOR) 25 MG tablet, Take 0.5 tablets (12.5 mg total) by mouth 2 (two) times daily., Disp: 90 tablet, Rfl: 3 .  nicotine (NICODERM CQ - DOSED IN MG/24 HOURS) 21 mg/24hr patch, Place 21 mg onto the skin daily. , Disp: , Rfl:  .  rosuvastatin (CRESTOR) 5 MG tablet, Take 1 tablet (5 mg total) by mouth daily., Disp: 90 tablet, Rfl: 3 .  tamsulosin (FLOMAX) 0.4  MG CAPS capsule, Take 0.4 mg by mouth daily. , Disp: , Rfl:   Past Medical History: Past Medical History:  Diagnosis Date  . Centrilobular emphysema ([place])   . Hyperlipidemia   . Hypertension   . S/P ascending aortic aneurysm repair 04/14/2020  . 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 Severe aortic stenosis   . Thoracic ascending aortic aneurysm ([place])   . Tobacco abuse     Tobacco Use: Social History   Tobacco Use  Smoking Status Current Some Day Smoker  . Packs/day: 0.25  . Years: 50.00  . Pack years: 12.50  . Types: Cigarettes  Smokeless Tobacco Never Used  Tobacco Comment   2 a day    Labs: Recent Review Flowsheet Data    Labs for ITP Cardiac and Pulmonary Rehab Latest Ref Rng & Units 04/14/2020 04/14/2020 04/14/2020 04/14/2020 04/14/2020   Cholestrol 100 - 199 mg/dL - - - - -   LDLCALC 0 - 99 mg/dL - - - - -   HDL >39 mg/dL - - - - -   Trlycerides 0 - 149 mg/dL - - - - -   Hemoglobin A1c 4.8 - 5.6 % - - - - -   PHART 7.35 - 7.45 7.364 - 7.324(L) 7.327(L) 7.322(L)  PCO2ART 32 - 48 mmHg 45.4 - 45.4 42.8 44.3   HCO3 20.0 - 28.0 mmol/L 25.9 - 24.0 22.3 22.7   TCO2 22 - 32 mmol/L 27 24 25 24 24    ACIDBASEDEF 0.0 - 2.0 mmol/L - - 3.0(H) 3.0(H) 3.0(H)   O2SAT % 100.0 - 96.0 97.0 99.0      Capillary Blood Glucose: Lab Results  Component Value Date   GLUCAP 121 (H) 04/16/2020   GLUCAP 116 (H) 04/16/2020   GLUCAP 83 04/16/2020   GLUCAP 93 04/16/2020   GLUCAP 110 (H) 04/16/2020     Exercise Target Goals: Exercise Program Goal: Individual exercise prescription set using results from initial 6 min walk test and THRR while considering  patient's activity barriers and safety.   Exercise Prescription Goal: Starting with aerobic activity 30 plus minutes a day, 3 days per week for  initial exercise prescription. Provide home exercise prescription and guidelines that participant acknowledges understanding prior to discharge.  Activity Barriers & Risk Stratification:  Activity Barriers & Cardiac Risk Stratification - 07/20/20 1227      Activity Barriers & Cardiac Risk Stratification   Activity Barriers Arthritis    Cardiac Risk Stratification High           6 Minute Walk:  6 Minute Walk    Row Name 07/20/20 1050         6 Minute Walk   Phase Initial     Distance 1180 feet     Walk Time 6 minutes     # of Rest Breaks 0     MPH 2.2     METS 2.5     RPE 11     Perceived Dyspnea  0     VO2 Peak 8.91     Symptoms No     Resting HR 62 bpm     Resting BP 118/74     Resting Oxygen Saturation  100 %     Exercise Oxygen Saturation  during 6 min walk 99 %     Max Ex. HR 90 bpm     Max Ex. BP 126/76     2 Minute Post BP 120/70            Oxygen Initial Assessment:   Oxygen Re-Evaluation:   Oxygen Discharge (Final Oxygen Re-Evaluation):   Initial Exercise Prescription:  Initial Exercise Prescription - 07/20/20 1300      Date of Initial Exercise RX and Referring Provider   Date 07/20/20    Referring Provider Adrian Prows, MD    Expected Discharge Date 09/17/20      NuStep   Level 2    SPM 85    Minutes 30    METs 2      Prescription Details   Frequency (times per week) 3    Duration Progress to 30 minutes of continuous aerobic without signs/symptoms of physical distress      Intensity   THRR 40-80% of Max Heartrate 59-118    Ratings of Perceived Exertion 11-13    Perceived Dyspnea 0-4      Progression   Progression Continue progressive overload as per policy without signs/symptoms or physical distress.      Resistance Training   Training Prescription Yes    Weight 3 lbs    Reps 10-15           Perform Capillary Blood Glucose checks as needed.  Exercise Prescription Changes:   Exercise Comments:   Exercise Goals and  Review:  Exercise  Goals    Row Name 07/20/20 1311             Exercise Goals   Increase Physical Activity Yes       Intervention Provide advice, education, support and counseling about physical activity/exercise needs.;Develop an individualized exercise prescription for aerobic and resistive training based on initial evaluation findings, risk stratification, comorbidities and participant's personal goals.       Expected Outcomes Short Term: Attend rehab on a regular basis to increase amount of physical activity.;Long Term: Add in home exercise to make exercise part of routine and to increase amount of physical activity.;Long Term: Exercising regularly at least 3-5 days a week.       Increase Strength and Stamina Yes       Intervention Provide advice, education, support and counseling about physical activity/exercise needs.;Develop an individualized exercise prescription for aerobic and resistive training based on initial evaluation findings, risk stratification, comorbidities and participant's personal goals.       Expected Outcomes Short Term: Increase workloads from initial exercise prescription for resistance, speed, and METs.;Short Term: Perform resistance training exercises routinely during rehab and add in resistance training at home;Long Term: Improve cardiorespiratory fitness, muscular endurance and strength as measured by increased METs and functional capacity (6MWT)       Able to understand and use rate of perceived exertion (RPE) scale Yes       Intervention Provide education and explanation on how to use RPE scale       Expected Outcomes Short Term: Able to use RPE daily in rehab to express subjective intensity level;Long Term:  Able to use RPE to guide intensity level when exercising independently       Knowledge and understanding of Target Heart Rate Range (THRR) Yes       Intervention Provide education and explanation of THRR including how the numbers were predicted and where they  are located for reference       Expected Outcomes Short Term: Able to state/look up THRR;Long Term: Able to use THRR to govern intensity when exercising independently;Short Term: Able to use daily as guideline for intensity in rehab       Able to check pulse independently Yes       Intervention Review the importance of being able to check your own pulse for safety during independent exercise;Provide education and demonstration on how to check pulse in carotid and radial arteries.       Expected Outcomes Short Term: Able to explain why pulse checking is important during independent exercise;Long Term: Able to check pulse independently and accurately       Understanding of Exercise Prescription Yes       Intervention Provide education, explanation, and written materials on patient's individual exercise prescription       Expected Outcomes Short Term: Able to explain program exercise prescription;Long Term: Able to explain home exercise prescription to exercise independently              Exercise Goals Re-Evaluation :    Discharge Exercise Prescription (Final Exercise Prescription Changes):   Nutrition:  Target Goals: Understanding of nutrition guidelines, daily intake of sodium 1500mg , cholesterol 200mg , calories 30% from fat and 7% or less from saturated fats, daily to have 5 or more servings of fruits and vegetables.  Biometrics:  Pre Biometrics - 07/20/20 1220      Pre Biometrics   Waist Circumference 36 inches    Hip Circumference 38 inches    Waist to Hip Ratio  0.95 %    Triceps Skinfold 9 mm    % Body Fat 22.8 %    Grip Strength 38 kg    Flexibility 10 in    Single Leg Stand 30 seconds            Nutrition Therapy Plan and Nutrition Goals:   Nutrition Assessments:  MEDIFICTS Score Key:  ?70 Need to make dietary changes   40-70 Heart Healthy Diet  ? 40 Therapeutic Level Cholesterol Diet   Picture Your Plate Scores:  <96 Unhealthy dietary pattern with much  room for improvement.  41-50 Dietary pattern unlikely to meet recommendations for good health and room for improvement.  51-60 More healthful dietary pattern, with some room for improvement.   >60 Healthy dietary pattern, although there may be some specific behaviors that could be improved.    Nutrition Goals Re-Evaluation:   Nutrition Goals Discharge (Final Nutrition Goals Re-Evaluation):   Psychosocial: Target Goals: Acknowledge presence or absence of significant depression and/or stress, maximize coping skills, provide positive support system. Participant is able to verbalize types and ability to use techniques and skills needed for reducing stress and depression.  Initial Review & Psychosocial Screening:  Initial Psych Review & Screening - 07/20/20 1331      Initial Review   Current issues with None Identified      Family Dynamics   Good Support System? Yes   Quin lives with his friend and has two children for support     Barriers   Psychosocial barriers to participate in program There are no identifiable barriers or psychosocial needs.      Screening Interventions   Interventions Encouraged to exercise           Quality of Life Scores:  Quality of Life - 07/20/20 1222      Quality of Life   Select Quality of Life      Quality of Life Scores   Health/Function Pre 24.4 %    Socioeconomic Pre 27.43 %    Psych/Spiritual Pre 30 %    Family Pre 30 %    GLOBAL Pre 27 %          Scores of 19 and below usually indicate a poorer quality of life in these areas.  A difference of  2-3 points is a clinically meaningful difference.  A difference of 2-3 points in the total score of the Quality of Life Index has been associated with significant improvement in overall quality of life, self-image, physical symptoms, and general health in studies assessing change in quality of life.  PHQ-9: Recent Review Flowsheet Data    Depression screen [facility] 2/9 07/20/2020   Decreased  Interest 0   Down, Depressed, Hopeless 0   PHQ - 2 Score 0     Interpretation of Total Score  Total Score Depression Severity:  1-4 = Minimal depression, 5-9 = Mild depression, 10-14 = Moderate depression, 15-19 = Moderately severe depression, 20-27 = Severe depression   Psychosocial Evaluation and Intervention:   Psychosocial Re-Evaluation:   Psychosocial Discharge (Final Psychosocial Re-Evaluation):   Vocational Rehabilitation: Provide vocational rehab assistance to qualifying candidates.   Vocational Rehab Evaluation & Intervention:  Vocational Rehab - 07/20/20 1023      Initial Vocational Rehab Evaluation & Intervention   Assessment shows need for Vocational Rehabilitation No           Education: Education Goals: Education classes will be provided on a weekly basis, covering required topics. Participant will state understanding/return  demonstration of topics presented.  Learning Barriers/Preferences:  Learning Barriers/Preferences - 07/20/20 1222      Learning Barriers/Preferences   Learning Barriers Sight   Reading glasses   Learning Preferences Group Instruction;Individual Instruction;Skilled Demonstration           Education Topics: Hypertension, Hypertension Reduction -Define heart disease and high blood pressure. Discus how high blood pressure affects the body and ways to reduce high blood pressure.   Exercise and Your Heart -Discuss why it is important to exercise, the FITT principles of exercise, normal and abnormal responses to exercise, and how to exercise safely.   Angina -Discuss definition of angina, causes of angina, treatment of angina, and how to decrease risk of having angina.   Cardiac Medications -Review what the following cardiac medications are used for, how they affect the body, and side effects that may occur when taking the medications.  Medications include Aspirin, Beta blockers, calcium channel blockers, ACE Inhibitors,  angiotensin receptor blockers, diuretics, digoxin, and antihyperlipidemics.   Congestive Heart Failure -Discuss the definition of CHF, how to live with CHF, the signs and symptoms of CHF, and how keep track of weight and sodium intake.   Heart Disease and Intimacy -Discus the effect sexual activity has on the heart, how changes occur during intimacy as we age, and safety during sexual activity.   Smoking Cessation / COPD -Discuss different methods to quit smoking, the health benefits of quitting smoking, and the definition of COPD.   Nutrition I: Fats -Discuss the types of cholesterol, what cholesterol does to the heart, and how cholesterol levels can be controlled.   Nutrition II: Labels -Discuss the different components of food labels and how to read food label   Heart Parts/Heart Disease and PAD -Discuss the anatomy of the heart, the pathway of blood circulation through the heart, and these are affected by heart disease.   Stress I: Signs and Symptoms -Discuss the causes of stress, how stress may lead to anxiety and depression, and ways to limit stress.   Stress II: Relaxation -Discuss different types of relaxation techniques to limit stress.   Warning Signs of Stroke / TIA -Discuss definition of a stroke, what the signs and symptoms are of a stroke, and how to identify when someone is having stroke.   Knowledge Questionnaire Score:  Knowledge Questionnaire Score - 07/20/20 1223      Knowledge Questionnaire Score   Pre Score 23/28           Core Components/Risk Factors/Patient Goals at Admission:  Personal Goals and Risk Factors at Admission - 07/20/20 1224      Core Components/Risk Factors/Patient Goals on Admission    Weight Management Weight Maintenance    Hypertension Yes    Intervention Provide education on lifestyle modifcations including regular physical activity/exercise, weight management, moderate sodium restriction and increased consumption of  fresh fruit, vegetables, and low fat dairy, alcohol moderation, and smoking cessation.;Monitor prescription use compliance.    Expected Outcomes Short Term: Continued assessment and intervention until BP is < 140/77mm HG in hypertensive participants. < 130/40mm HG in hypertensive participants with diabetes, heart failure or chronic kidney disease.;Long Term: Maintenance of blood pressure at goal levels.    Lipids Yes    Intervention Provide education and support for participant on nutrition & aerobic/resistive exercise along with prescribed medications to achieve LDL 70mg , HDL >40mg .    Expected Outcomes Short Term: Participant states understanding of desired cholesterol values and is compliant with medications prescribed. Participant is following exercise  prescription and nutrition guidelines.;Long Term: Cholesterol controlled with medications as prescribed, with individualized exercise RX and with personalized nutrition plan. Value goals: LDL < 70mg , HDL > 40 mg.           Core Components/Risk Factors/Patient Goals Review:    Core Components/Risk Factors/Patient Goals at Discharge (Final Review):    ITP Comments:  ITP Comments    Row Name 07/20/20 1014           ITP Comments Dr Fransico Him MD, Medical Director              Comments:Dywane  attended orientation on 07/20/2020 to review rules and guidelines for program.  Completed 6 minute walk test, Intitial ITP, and exercise prescription.  VSS. Telemetry-Sinus Rhythm with arrhythmia occasional PVC's .  Asymptomatic. Safety measures and social distancing in place per CDC guidelines.[place] still smokes and was given the number for 1-800-quit now.Barnet Pall, RN,BSN 07/20/2020 1:45 PM

## 2020-07-26 ENCOUNTER — Encounter (HOSPITAL_COMMUNITY)
Admission: RE | Admit: 2020-07-26 | Discharge: 2020-07-26 | Disposition: A | Discharge status: Home / Self Care | Payer: Medicare HMO | Source: Ambulatory Visit | Attending: Cardiology | Admitting: Cardiology

## 2020-07-26 ENCOUNTER — Other Ambulatory Visit: Payer: Self-pay

## 2020-07-26 DIAGNOSIS — Z953 Presence of xenogenic heart valve: Secondary | ICD-10-CM | POA: Diagnosis present

## 2020-07-26 DIAGNOSIS — Z9889 Other specified postprocedural states: Secondary | ICD-10-CM | POA: Diagnosis not present

## 2020-07-26 DIAGNOSIS — Z8679 Personal history of other diseases of the circulatory system: Secondary | ICD-10-CM | POA: Diagnosis present

## 2020-07-26 NOTE — Progress Notes (Signed)
Daily Session Note  Patient Details  Name: Kurt Allen MRN: [id number] Date of Birth: 05-09-1946 Referring Provider:     CARDIAC REHAB PHASE II ORIENTATION from 07/20/2020 in [facility] CARDIAC REHAB  Referring Provider Yates Decamp, MD      Encounter Date: 07/26/2020  Check In:  Session Check In - 07/26/20 1348      Check-In   Supervising physician immediately available to respond to emergencies [facility] immediately available    Physician(s) Dr Alanda Slim    Location [facility]    Staff Present Lorin Picket, MS, EP-C, CCRP;Maria Whitaker, RN, Delphia Grates, RN, Zachery Conch, MS, ACSM CEP, Exercise Physiologist    Virtual Visit No    Medication changes reported     No    Fall or balance concerns reported    No    Tobacco Cessation No Change    Warm-up and Cool-down Performed on first and last piece of equipment    Resistance Training Performed Yes    VAD Patient? No    PAD/SET Patient? No      Pain Assessment   Currently in Pain? No/denies    Pain Score 0-No pain           Capillary Blood Glucose: No results found for this or any previous visit (from the past 24 hour(s)).   Exercise Prescription Changes - 07/26/20 1400      Response to Exercise   Blood Pressure (Admit) 120/70    Blood Pressure (Exercise) 126/72    Blood Pressure (Exit) 127/80    Heart Rate (Admit) 63 bpm    Heart Rate (Exercise) 69 bpm    Heart Rate (Exit) 56 bpm    Rating of Perceived Exertion (Exercise) 11    Perceived Dyspnea (Exercise) 0    Symptoms None    Comments Pt's first day of exercise in the CRP2 program.     Duration Progress to 30 minutes of  aerobic without signs/symptoms of physical distress    Intensity THRR unchanged      Progression   Progression Continue to progress workloads to maintain intensity without signs/symptoms of physical distress.    Average METs 1.8      Resistance Training   Training Prescription Yes     Weight 3 lbs    Reps 10-15    Time 10 Minutes      Interval Training   Interval Training No      NuStep   Level 2    SPM 70    Minutes 30    METs 1.8           Social History   Tobacco Use  Smoking Status Current Some Day Smoker  . Packs/day: 0.25  . Years: 50.00  . Pack years: 12.50  . Types: Cigarettes  Smokeless Tobacco Never Used  Tobacco Comment   2 a day    Goals Met:  Exercise tolerated well Personal goals reviewed No report of cardiac concerns or symptoms Strength training completed today  Goals Unmet:  Not Applicable  Comments: .Pt started cardiac rehab today.  Pt tolerated light exercise without difficulty. VSS, telemetry-SB/NSR, asymptomatic.  Medication list reconciled. Pt denies barriers to medicaiton compliance.  PSYCHOSOCIAL ASSESSMENT:  PHQ-0. Pt exhibits positive coping skills, hopeful outlook with supportive family. No psychosocial needs identified at this time, no psychosocial interventions necessary. Pt oriented to exercise equipment and routine. Understanding verbalized.   Dr. Armanda Magic is Medical Director for Cardiac Rehab at  [facility].

## 2020-07-28 ENCOUNTER — Other Ambulatory Visit: Payer: Self-pay

## 2020-07-28 ENCOUNTER — Ambulatory Visit: Payer: Medicare HMO | Admitting: Cardiology

## 2020-07-28 ENCOUNTER — Encounter: Payer: Self-pay | Admitting: Cardiology

## 2020-07-28 ENCOUNTER — Encounter (HOSPITAL_COMMUNITY)
Admission: RE | Admit: 2020-07-28 | Discharge: 2020-07-28 | Disposition: A | Discharge status: Home / Self Care | Payer: Medicare HMO | Source: Ambulatory Visit | Attending: Cardiology | Admitting: Cardiology

## 2020-07-28 VITALS — BP 134/86 | HR 69 | Resp 16 | Ht 66.0 in | Wt 157.2 lb

## 2020-07-28 DIAGNOSIS — Z953 Presence of xenogenic heart valve: Secondary | ICD-10-CM

## 2020-07-28 DIAGNOSIS — Z8679 Personal history of other diseases of the circulatory system: Secondary | ICD-10-CM

## 2020-07-28 DIAGNOSIS — Z9889 Other specified postprocedural states: Secondary | ICD-10-CM | POA: Diagnosis not present

## 2020-07-28 DIAGNOSIS — F17200 Nicotine dependence, unspecified, uncomplicated: Secondary | ICD-10-CM

## 2020-07-28 DIAGNOSIS — R0609 Other forms of dyspnea: Secondary | ICD-10-CM

## 2020-07-28 DIAGNOSIS — I6522 Occlusion and stenosis of left carotid artery: Secondary | ICD-10-CM

## 2020-07-28 DIAGNOSIS — R0600 Dyspnea, unspecified: Secondary | ICD-10-CM

## 2020-07-28 NOTE — Progress Notes (Signed)
[ Primary Physician/Referring:  Timoteo Gaul, FNP  Patient ID: Kurt Allen, male    DOB: 12-17-1945, 74 y.o.   MRN: [id number]  Chief Complaint  Patient presents with  . Aortic Aneurysm Repair  . Shortness of Breath  . Follow-up    2 month   HPI:    Kurt Allen  is a 74 y.o. Caucasian male with hypertension, tobacco use disorder, severe aortic valve stenosis confirmed by cardiac catheterization on 02/17/2020 without significant coronary artery disease, a 5.1 cm ascending aortic aneurysm noted during low-dose CT scan of the chest for lung cancer screening, abnormal lung mass and needs further follow-up.    Patient underwent elective bioprosthetic aortic valve replacement along with aortic root repair on 04/14/2020.  Warfarin was discontinued she has maintained sinus rhythm.  He has 100+ year history of tobacco use, started smoking when he is 74 years of age and has been smoking about 2 packs cigarettes a day.  Graph since surgery he is now smoking about 1 to 2 cigarettes a day.  He is trying his best to quit smoking.  Except for chronic dyspnea denies palpitations, pain, claudication.   Past Medical History:  Diagnosis Date  . Centrilobular emphysema ([place])   . Hyperlipidemia   . Hypertension   . S/P ascending aortic aneurysm repair 04/14/2020  . 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 Severe aortic stenosis   . Thoracic ascending aortic aneurysm ([place])   . Tobacco abuse    Past Surgical History:  Procedure Laterality Date  . AORTIC VALVE REPLACEMENT N/A 04/14/2020   Procedure: AORTIC VALVE REPLACEMENT (AVR);  Surgeon: Rexene Alberts, MD;  Location: [place];  Service: Open Heart Surgery;  Laterality: N/A;  . BENTALL PROCEDURE N/A 04/14/2020   Procedure: BENTALL AORTIC ROOT  REPLACEMENT USING KONECT RESILIA 25MM AORTIC VALVED CONDUIT WITH REIMPLANTATION OF RIGHT AND LEFT CORONARY BUTTONS;  Surgeon: Rexene Alberts, MD;  Location: [place];  Service: Open Heart Surgery;  Laterality: N/A;  . CARDIAC CATHETERIZATION    . EYE SURGERY Bilateral    cataracts  . RIGHT HEART CATH AND CORONARY ANGIOGRAPHY N/A 02/17/2020   Procedure: RIGHT HEART CATH AND CORONARY ANGIOGRAPHY;  Surgeon: Adrian Prows, MD;  Location: [place] [facility];  Service: Cardiovascular;  Laterality: N/A;  . TEE WITHOUT CARDIOVERSION N/A 04/14/2020   Procedure: TRANSESOPHAGEAL ECHOCARDIOGRAM (TEE);  Surgeon: Rexene Alberts, MD;  Location: [place];  Service: Open Heart Surgery;  Laterality: N/A;  . THORACIC AORTIC ANEURYSM REPAIR N/A 04/14/2020   Procedure: THORACIC ASCENDING ANEURYSM REPAIR (AAA);  Surgeon: Rexene Alberts, MD;  Location: [place];  Service: Open Heart Surgery;  Laterality: N/A;   History reviewed. No pertinent family history.  Social History   Tobacco Use  . Smoking status: Current Some Day Smoker    Packs/day: 0.25    Years: 50.00    Pack years: 12.50    Types: Cigarettes  . Smokeless tobacco: Never Used  . Tobacco comment: 2 a day  Substance Use Topics  . Alcohol use: Yes    Alcohol/week: 10.0 standard drinks    Types: 10 Cans of beer per week    Comment: per week   Marital Status: Divorced  ROS  Review of Systems  Cardiovascular: Positive for dyspnea on exertion. Negative for chest pain and leg  swelling.  Gastrointestinal: Negative for melena.   Objective  Blood pressure 134/86, pulse 69, resp. rate 16, height '5\' 6"'  (1.676 m), weight 157 lb 3.2 oz (71.3 kg), SpO2 99 %.  Vitals with BMI 07/28/2020 07/20/2020 07/19/2020  Height '5\' 6"'  5' 6.25" '5\' 8"'   Weight 157 lbs 3 oz 153 lbs 11 oz 155 lbs 6 oz  BMI 25.38 82.70 78.67  Systolic 544 920 100  Diastolic 86 74 78  Pulse 69 - 63     Physical Exam Constitutional:      Appearance: He is well-developed.  Cardiovascular:      Rate and Rhythm: Normal rate.     Pulses:          Carotid pulses are on the left side with bruit.      Femoral pulses are 2+ on the right side.      Popliteal pulses are 2+ on the right side and 2+ on the left side.       Dorsalis pedis pulses are 1+ on the right side and 2+ on the left side.       Posterior tibial pulses are 2+ on the right side and 2+ on the left side.     Heart sounds: Normal heart sounds. No murmur heard.  No gallop.      Comments: No pedal edema. No JVD Pulmonary:     Effort: Pulmonary effort is normal.     Breath sounds: Decreased breath sounds (barrel shaped chest with emphysema) present.  Abdominal:     General: Bowel sounds are normal.     Palpations: Abdomen is soft.    Laboratory examination:   Recent Labs    04/18/20 0447 04/19/20 0158 04/20/20 0140  NA 133* 128* 132*  K 3.3* 3.9 3.7  CL 101 90* 92*  CO2 '25 27 29  ' GLUCOSE 97 109* 113*  BUN '10 11 12  ' CREATININE 0.71 0.93 1.03  CALCIUM 6.9* 8.4* 8.5*  GFRNONAA >60 >60 >60  GFRAA >60 >60 >60   CrCl cannot be calculated (Patient's most recent lab result is older than the maximum 21 days allowed.).  CMP Latest Ref Rng & Units 04/20/2020 04/19/2020 04/18/2020  Glucose 70 - 99 mg/dL 113(H) 109(H) 97  BUN 8 - 23 mg/dL '12 11 10  ' Creatinine 0.61 - 1.24 mg/dL 1.03 0.93 0.71  Sodium 135 - 145 mmol/L 132(L) 128(L) 133(L)  Potassium 3.5 - 5.1 mmol/L 3.7 3.9 3.3(L)  Chloride 98 - 111 mmol/L 92(L) 90(L) 101  CO2 22 - 32 mmol/L '29 27 25  ' Calcium 8.9 - 10.3 mg/dL 8.5(L) 8.4(L) 6.9(L)  Total Protein 6.5 - 8.1 g/dL - - -  Total Bilirubin 0.3 - 1.2 mg/dL - - -  Alkaline Phos 38 - 126 U/L - - -  AST 15 - 41 U/L - - -  ALT 0 - 44 U/L - - -   CBC Latest Ref Rng & Units 04/19/2020 04/18/2020 04/17/2020  WBC 4.0 - 10.5 K/uL 4.8 5.0 7.4  Hemoglobin 13.0 - 17.0 g/dL 10.4(L) 9.9(L) 11.0(L)  Hematocrit 39 - 52 % 31.2(L) 29.3(L) 32.7(L)  Platelets 150 - 400 K/uL 196 163 117(L)   Lipid Panel     Component Value  Date/Time   CHOL 133 02/02/2020 0956   TRIG 57 02/02/2020 0956   HDL 69 02/02/2020 0956   LDLCALC 52 02/02/2020 0956   HEMOGLOBIN A1C Lab Results  Component Value Date   HGBA1C 5.3 04/12/2020   MPG 105 04/12/2020   TSH  No results for input(s): TSH in the last 8760 hours.  External labs:    10/28/2019: Glucose 99, BUN/Cr 12/1.05. EGFR 71. Na/K 135/4.6. Rest of the CMP normal  H/H 16.4/48.6. MCV 91.2. Platelets 240  HbA1C 5.2%  Chol 179, TG 72, HDL 59, LDL 104  TSH 1.39 normal  Medications and allergies   No Known Allergies   Current Outpatient Medications on File Prior to Visit  Medication Sig Dispense Refill  . acetaminophen (TYLENOL) 325 MG tablet Take 2 tablets (650 mg total) by mouth every 6 (six) hours as needed for mild pain.    Marland Kitchen albuterol (VENTOLIN HFA) 108 (90 Base) MCG/ACT inhaler Inhale 1 puff into the lungs every 4 (four) hours as needed for wheezing or shortness of breath.     Marland Kitchen aspirin EC 81 MG EC tablet Take 1 tablet (81 mg total) by mouth daily. Swallow whole.    . Cholecalciferol (VITAMIN D3) 1.25 MG (50000 UT) CAPS Take 50,000 Units by mouth once a week.     . fluticasone (FLONASE) 50 MCG/ACT nasal spray Place 1 spray into both nostrils daily as needed for rhinitis.     . magnesium oxide (MAG-OX) 400 (241.3 Mg) MG tablet Take 1 tablet (400 mg total) by mouth 2 (two) times daily. 30 tablet 0  . metoprolol tartrate (LOPRESSOR) 25 MG tablet Take 0.5 tablets (12.5 mg total) by mouth 2 (two) times daily. 90 tablet 3  . nicotine (NICODERM CQ - DOSED IN MG/24 HOURS) 21 mg/24hr patch Place 21 mg onto the skin daily.     . rosuvastatin (CRESTOR) 5 MG tablet Take 1 tablet (5 mg total) by mouth daily. 90 tablet 3  . tamsulosin (FLOMAX) 0.4 MG CAPS capsule Take 0.4 mg by mouth daily.      No current facility-administered medications on file prior to visit.    Radiology:   CT Chest Lung 12/25/2019: 1. 8.2 mm irregular opacity anterior right lung apex,  potentially scarring. Lung-RADS 4A, suspicious. Follow up low-dose chest CT without contrast in 3 months (please use the following order, "CT CHEST LCS NODULE FOLLOW-UP W/O CM") is recommended.  Alternatively, PET may be considered when there is a solid component 84m or larger. 2. 5.1 cm diameter ascending thoracic aorta with dense calcification of the aortic valve. Aortic stenosis would be a consideration. Ascending thoracic aortic aneurysm. Recommend semi-annual imaging followup by CTA or MRA and referral to cardiothoracic surgery if not already obtained. This recommendation follows 2010 ACCF/AHA/AATS/ACR/ASA/SCA/SCAI/SIR/STS/SVM Guidelines for the Diagnosis and Management of Patients With Thoracic Aortic Disease. Circulation. 2010; 121:: O671-I458   3. Emphysema (ICD10-J43.9) and Aortic Atherosclerosis.  Cardiac Studies:   Abdominal Aortic Duplex 01/13/2020:  The maximum aorta (sac) diameter is 2.22 cm (mid). Mild ectasia noted.  Diffuse plaque observed in the proximal, mid and distal aorta. Normal flow  velocities noted in the aorta and iliac arteries.  No AAA noted. Consider recheck in 10 years for progression to AAA.  Carotid artery duplex 009/98/3382  Peak systolic velocities in the right bifurcation, internal, external and  common carotid arteries are within normal limits.  Stenosis in the left internal carotid artery (16-49%). However left vessel  is tortuous, bruit and elevated velocity may be related to geometry.  Antegrade right vertebral artery flow. Antegrade left vertebral artery  Flow.  Echocardiogram 01/13/2020:  Left ventricle cavity is normal in size. Moderate concentric hypertrophy  of the left ventricle. Normal global wall motion. Normal LV systolic  function with visual EF 50-55%. Indeterminate  diastolic filling pattern.  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 Severe  annular and leaflet calcification.  Severe aortic stenosis. Aortic valve mean  gradient of 44 mmHg, Vmax of 4.1  m/s. Calculated aortic valve area by continuity equation is 0.5 cm.  Mild (Grade I) aortic regurgitation.  Mild (Grade I) mitral regurgitation.  Inadequate TR jet to estimate pulmonary artery systolic pressure. Normal  right atrial pressure.  Carotid artery duplex 80/16/5537:  Peak systolic velocities in the right bifurcation, internal, external and common carotid arteries are within normal limits.  Stenosis in the left internal carotid artery (16-49%). However left vessel is tortuous, bruit and elevated velocity may be related to geometry.  Antegrade right vertebral artery flow. Antegrade left vertebral artery flow.  Follow up in one year is appropriate if clinically indicated.  ABI 02/12/2020:  This exam reveals normal perfusion of the lower extremity (Bilateral ABI  1.00) with normal triphasic waveform pattern.   Left and right heart catheterization 02/17/2020: Normal coronary arteries with right dominant circulation. Normal right heart cardiac catheterization, preserved cardiac output and cardiac index at 4.22 and 2.29 respectively.  Recommendation: Patient will need aortic valve replacement for severe aortic stenosis, also has ascending aortic aneurysm needs to be addressed.  Will make referral for surgical team to evaluate  EKG   EKG 05/14/2020: Normal sinus rhythm at rate of 70 bpm, normal axis.  No evidence of ischemia, normal EKG.  EKG 01/07/2020: Normal sinus rhythm at rate of 70 bpm, left atrial enlargement, otherwise normal EKG.    Assessment     ICD-10-CM   1. Dyspnea on exertion  R06.00 PCV ECHOCARDIOGRAM COMPLETE  2. S/P aortic valve replacement with bioprosthetic valve  Z95.3 PCV ECHOCARDIOGRAM COMPLETE  3. S/P ascending aortic aneurysm repair  Z98.890 PCV ECHOCARDIOGRAM COMPLETE   Z86.79   4. Tobacco use disorder  F17.200   5. Asymptomatic stenosis of left carotid artery  I65.22     No orders of the defined types were placed  in this encounter.   There are no discontinued medications.  Recommendations:   Kurt Allen  is a  74 y.o. Caucasian male with hypertension, tobacco use disorder, severe aortic valve stenosis confirmed by cardiac catheterization on 02/17/2020 without significant coronary artery disease, a 5.1 cm ascending aortic aneurysm noted during low-dose CT scan of the chest for lung cancer screening, abnormal lung mass and has f/u scans and review by Dr. Elsworth Soho.  Caucasian male with hypertension, tobacco use disorder, severe aortic valve stenosis confirmed by cardiac catheterization on 02/17/2020 without significant coronary artery disease, a 5.1 cm ascending aortic aneurysm noted during low-dose CT scan of the chest for lung cancer screening, abnormal lung mass and needs further follow-up.    Patient underwent elective bioprosthetic aortic valve replacement along with aortic root repair on 04/14/2020.  He is maintaining sinus rhythm on auscultation, he is now off of warfarin and presently on aspirin alone.  Blood pressure is well controlled and lipids are also well controlled.  I had extensive discussion with the patient regarding complete abstinence from tobacco use, presently smoking about 2 cigarettes a day.  I will repeat her echocardiogram to follow-up on dyspnea and aortic valve replacement and aortic root replacement.  He may need a CT angiogram of the chest probably in a year.  He has mild carotid artery stenosis, will also need carotid artery duplex 6 months from now prior to his next office visit.    Adrian Prows, MD, [facility]  07/28/2020, 11:41 AM Office: [phone]   CC: Lilly Cove, MD

## 2020-07-30 ENCOUNTER — Other Ambulatory Visit: Payer: Self-pay

## 2020-07-30 ENCOUNTER — Encounter (HOSPITAL_COMMUNITY)
Admission: RE | Admit: 2020-07-30 | Discharge: 2020-07-30 | Disposition: A | Discharge status: Home / Self Care | Payer: Medicare HMO | Source: Ambulatory Visit | Attending: Cardiology | Admitting: Cardiology

## 2020-07-30 DIAGNOSIS — Z9889 Other specified postprocedural states: Secondary | ICD-10-CM

## 2020-07-30 DIAGNOSIS — Z8679 Personal history of other diseases of the circulatory system: Secondary | ICD-10-CM

## 2020-07-30 DIAGNOSIS — Z953 Presence of xenogenic heart valve: Secondary | ICD-10-CM

## 2020-07-30 NOTE — Progress Notes (Signed)
Kurt Allen 74 y.o. 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 Centrilobular emphysema ([place])   . Hyperlipidemia   . Hypertension   . S/P ascending aortic aneurysm repair 04/14/2020  . 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 Severe aortic stenosis   . Thoracic ascending aortic aneurysm ([place])   . Tobacco abuse      Medications reviewed.   Current Outpatient Medications:  .  acetaminophen (TYLENOL) 325 MG tablet, Take 2 tablets (650 mg total) by mouth every 6 (six) hours as needed for mild pain., Disp: , Rfl:  .  albuterol (VENTOLIN HFA) 108 (90 Base) MCG/ACT inhaler, Inhale 1 puff into the lungs every 4 (four) hours as needed for wheezing or shortness of breath. , Disp: , Rfl:  .  aspirin EC 81 MG EC tablet, Take 1 tablet (81 mg total) by mouth daily. Swallow whole., Disp: , Rfl:  .  Cholecalciferol (VITAMIN D3) 1.25 MG (50000 UT) CAPS, Take 50,000 Units by mouth once a week. , Disp: , Rfl:  .  fluticasone (FLONASE) 50 MCG/ACT nasal spray, Place 1 spray into both nostrils daily as needed for rhinitis. , Disp: , Rfl:  .  magnesium oxide (MAG-OX) 400 (241.3 Mg) MG tablet, Take 1 tablet (400 mg total) by mouth 2 (two) times daily., Disp: 30 tablet, Rfl: 0 .  metoprolol tartrate (LOPRESSOR) 25 MG tablet, Take 0.5 tablets (12.5 mg total) by mouth 2 (two) times daily., Disp: 90 tablet, Rfl: 3 .  nicotine (NICODERM CQ - DOSED IN MG/24 HOURS) 21 mg/24hr patch, Place 21 mg onto the skin daily. , Disp: , Rfl:  .  rosuvastatin (CRESTOR) 5 MG tablet, Take 1 tablet (5 mg total) by mouth daily., Disp: 90 tablet, Rfl: 3 .  tamsulosin (FLOMAX) 0.4  MG CAPS capsule, Take 0.4 mg by mouth daily. , Disp: , Rfl:    Ht Readings from Last 1 Encounters:  07/28/20 _0  (1.676 m)     Wt Readings from Last 3 Encounters:  07/28/20 157 lb 3.2 oz (71.3 kg)  07/20/20 153 lb 10.6 oz (69.7 kg)  07/19/20 155 lb 6.4 oz (70.5 kg)     There is no height or weight on file to calculate BMI.   Social History   Tobacco Use  Smoking Status Current Some Day Smoker  . Packs/day: 0.25  . Years: 50.00  . Pack years: 12.50  . Types: Cigarettes  Smokeless Tobacco Never Used  Tobacco Comment   2 a day     Lab Results  Component Value Date   CHOL 133 02/02/2020   Lab Results  Component Value Date   HDL 69 02/02/2020   Lab Results  Component Value Date   LDLCALC 52 02/02/2020   Lab Results  Component Value Date   TRIG 57 02/02/2020     Lab Results  Component Value Date   HGBA1C 5.3 04/12/2020     CBG (last 3)  No results for input(s): GLUCAP in the last 72 hours.   Nutrition Note  Spoke with pt. Nutrition Plan and Nutrition Survey goals reviewed with pt. Survey shows pt could benefit from diet improvement.  Lives with roommate who helps with cooking.  10-12 lb weight loss with surgery but has regained.  Monitoring BP at home. WNL. No label reading but could benefit from education. Pt reports little fruit and veggie intake. He eats for convenience most of the time.  Interested in making changes. He feels his diet is important to his health.  Tries to limit sodium.  Diet recall: Breakfast: rice krispies and whole milk, 2 tsp sugar.  Snack: banana nut muffin Dinner: stouffers mac and cheese (2cups) Fluids: little water, 5-8 cups coffee, 3 12oz light beers/day  Pt expressed understanding of the information reviewed.    Nutrition Diagnosis ? Food-and nutrition-related knowledge deficit related to lack of exposure to information as related to diagnosis of: ? CVD ?  Nutrition Intervention ? Pt's individual nutrition plan  reviewed with pt. ? Benefits of adopting Heart Healthy diet discussed when Med[employer]s reviewed. ? Continue client-centered nutrition education by RD, as part of interdisciplinary care.  Goal(s) ? Pt to build a healthy plate including vegetables, fruits, whole grains, and low-fat dairy products in a heart healthy meal plan. ? Buy frozen or canned fruits and veggies ? Choose Low sodium or in natural juices when choosing canned ? Choose olive oil and nuts daily  Plan:   Will provide client-centered nutrition education as part of interdisciplinary care  Monitor and evaluate progress toward nutrition goal with team.   Michaele Offer, MS, RDN, LDN

## 2020-08-02 ENCOUNTER — Encounter (HOSPITAL_COMMUNITY)
Admission: RE | Admit: 2020-08-02 | Discharge: 2020-08-02 | Disposition: A | Discharge status: Home / Self Care | Payer: Medicare HMO | Source: Ambulatory Visit | Attending: Cardiology | Admitting: Cardiology

## 2020-08-02 ENCOUNTER — Other Ambulatory Visit: Payer: Self-pay

## 2020-08-02 DIAGNOSIS — Z9889 Other specified postprocedural states: Secondary | ICD-10-CM

## 2020-08-02 DIAGNOSIS — Z953 Presence of xenogenic heart valve: Secondary | ICD-10-CM

## 2020-08-03 NOTE — Progress Notes (Signed)
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  acetaminophen (TYLENOL) 325 MG tablet, Take 2 tablets (650 mg total) by mouth every 6 (six) hours as needed for mild pain., Disp: , Rfl:  .  albuterol (VENTOLIN HFA) 108 (90 Base) MCG/ACT inhaler, Inhale 1 puff into the lungs every 4 (four) hours as needed for wheezing or shortness of breath. , Disp: , Rfl:  .  aspirin EC 81 MG EC tablet, Take 1 tablet (81 mg total) by mouth daily. Swallow whole., Disp: , Rfl:  .  Cholecalciferol (VITAMIN D3) 1.25 MG (50000 UT) CAPS, Take 50,000 Units by mouth once a week. , Disp: , Rfl:  .  fluticasone (FLONASE) 50 MCG/ACT nasal spray, Place 1 spray into both nostrils daily as needed for rhinitis. , Disp: , Rfl:  .  magnesium oxide (MAG-OX) 400 (241.3 Mg) MG tablet, Take 1 tablet (400 mg total) by mouth 2 (two) times daily., Disp: 30 tablet, Rfl: 0 .  metoprolol tartrate (LOPRESSOR) 25 MG tablet, Take 0.5 tablets (12.5 mg total) by mouth 2 (two) times daily., Disp: 90 tablet, Rfl: 3 .  nicotine (NICODERM CQ - DOSED IN MG/24 HOURS) 21 mg/24hr patch, Place 21 mg onto the skin daily. , Disp: , Rfl:  .  rosuvastatin (CRESTOR) 5 MG tablet, Take 1 tablet (5 mg total) by mouth daily., Disp: 90 tablet, Rfl: 3 .  tamsulosin (FLOMAX) 0.4 MG CAPS capsule, Take 0.4 mg by mouth daily. , Disp: , Rfl:    Past Medical History: Past Medical History:  Diagnosis Date  . Centrilobular emphysema ([place])   . Hyperlipidemia   . Hypertension   . S/P ascending aortic aneurysm repair 04/14/2020  . 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 Severe aortic stenosis   . Thoracic ascending aortic aneurysm ([place])   . Tobacco abuse     Tobacco Use: Social History   Tobacco Use  Smoking Status Current Some Day Smoker  . Packs/day: 0.25  . Years: 50.00  . Pack years: 12.50  . Types: Cigarettes  Smokeless Tobacco Never Used  Tobacco Comment   2 a day    Labs: Recent Review Flowsheet Data    Labs for ITP Cardiac and Pulmonary Rehab Latest Ref Rng & Units 04/14/2020 04/14/2020 04/14/2020 04/14/2020 04/14/2020   Cholestrol 100 - 199 mg/dL - - - - -   LDLCALC 0 - 99 mg/dL - - - - -   HDL >39 mg/dL - - - - -   Trlycerides 0 - 149 mg/dL - - - - -   Hemoglobin A1c 4.8 - 5.6 % - - - - -   PHART 7.350 - 7.450 7.364 - 7.324(L) 7.327(L) 7.322(L)   PCO2ART 32.0 - 48.0 mmHg 45.4 - 45.4 42.8 44.3  HCO3 20.0 - 28.0 mmol/L 25.9 - 24.0 22.3 22.7   TCO2 22 - 32 mmol/L '27 24 25 24 24   ' ACIDBASEDEF 0.0 - 2.0 mmol/L - - 3.0(H) 3.0(H) 3.0(H)   O2SAT % 100.0 - 96.0 97.0 99.0      Capillary Blood Glucose: Lab Results  Component Value Date   GLUCAP 121 (H) 04/16/2020   GLUCAP 116 (H) 04/16/2020   GLUCAP 83 04/16/2020   GLUCAP 93 04/16/2020   GLUCAP 110 (H) 04/16/2020     Exercise Target Goals: Exercise Program Goal: Individual exercise prescription set using results from initial 6 min walk test and THRR while considering  patient's activity barriers and safety.   Exercise Prescription Goal: Initial exercise prescription builds to 30-45 minutes a day of aerobic activity, 2-3 days per week.  Home exercise guidelines will  be given to patient during program as part of exercise prescription that the participant will acknowledge.  Activity Barriers & Risk Stratification:  Activity Barriers & Cardiac Risk Stratification - 07/20/20 1227      Activity Barriers & Cardiac Risk Stratification   Activity Barriers Arthritis    Cardiac Risk Stratification High           6 Minute Walk:  6 Minute Walk    Row Name 07/20/20 1050         6 Minute Walk   Phase Initial     Distance 1180 feet     Walk Time 6 minutes     # of Rest Breaks 0     MPH 2.2     METS 2.5     RPE 11     Perceived Dyspnea  0     VO2 Peak 8.91     Symptoms No     Resting HR 62 bpm     Resting BP 118/74     Resting Oxygen Saturation  100 %     Exercise Oxygen Saturation  during 6 min walk 99 %     Max Ex. HR 90 bpm     Max Ex. BP 126/76     2 Minute Post BP 120/70            Oxygen Initial Assessment:   Oxygen Re-Evaluation:   Oxygen Discharge (Final Oxygen Re-Evaluation):   Initial Exercise Prescription:  Initial Exercise Prescription - 07/20/20 1300      Date of Initial Exercise RX and Referring Provider   Date 07/20/20    Referring Provider Adrian Prows, MD    Expected Discharge Date 09/17/20      NuStep   Level 2    SPM 85    Minutes 30    METs 2      Prescription Details   Frequency (times per week) 3    Duration Progress to 30 minutes of continuous aerobic without signs/symptoms of physical distress      Intensity   THRR 40-80% of Max Heartrate 59-118    Ratings of Perceived Exertion 11-13    Perceived Dyspnea 0-4      Progression   Progression Continue progressive overload as per policy without signs/symptoms or physical distress.      Resistance Training   Training Prescription Yes    Weight 3 lbs    Reps 10-15           Perform Capillary Blood Glucose checks as needed.  Exercise Prescription Changes:  Exercise Prescription Changes    Row Name 07/26/20 1400  Response to  Exercise   Blood Pressure (Admit) 120/70       Blood Pressure (Exercise) 126/72       Blood Pressure (Exit) 127/80       Heart Rate (Admit) 63 bpm       Heart Rate (Exercise) 69 bpm       Heart Rate (Exit) 56 bpm       Rating of Perceived Exertion (Exercise) 11       Perceived Dyspnea (Exercise) 0       Symptoms None       Comments Pt's first day of exercise in the CRP2 program.        Duration Progress to 30 minutes of  aerobic without signs/symptoms of physical distress       Intensity THRR unchanged               Progression   Progression Continue to progress workloads to maintain intensity without signs/symptoms of physical distress.       Average METs 1.8               Resistance Training   Training Prescription Yes       Weight 3 lbs       Reps 10-15       Time 10 Minutes               Interval Training   Interval Training No               NuStep   Level 2       SPM 70       Minutes 30       METs 1.8              Exercise Comments:  Exercise Comments    Row Name 07/26/20 1456           Exercise Comments Pt's first day of exercise in the CRP2 program. Pt tolerated the exercise session well.              Exercise Goals and Review:  Exercise Goals    Row Name 07/20/20 1311             Exercise Goals   Increase Physical Activity Yes       Intervention Provide advice, education, support and counseling about physical activity/exercise needs.;Develop an individualized exercise prescription for aerobic and resistive training based on initial evaluation findings, risk stratification, comorbidities and participant's personal goals.       Expected Outcomes Short Term: Attend rehab on a regular basis to increase amount of physical activity.;Long Term: Add in home exercise to make exercise part of routine and to increase amount of physical activity.;Long Term: Exercising regularly at least 3-5 days a week.       Increase Strength and Stamina Yes        Intervention Provide advice, education, support and counseling about physical activity/exercise needs.;Develop an individualized exercise prescription for aerobic and resistive training based on initial evaluation findings, risk stratification, comorbidities and participant's personal goals.       Expected Outcomes Short Term: Increase workloads from initial exercise prescription for resistance, speed, and METs.;Short Term: Perform resistance training exercises routinely during rehab and add in resistance training at home;Long Term: Improve cardiorespiratory fitness, muscular endurance and strength as measured by increased METs and functional capacity (6MWT)       Able to understand and use rate of perceived exertion (RPE) scale Yes  Intervention Provide education and explanation on how to use RPE scale       Expected Outcomes Short Term: Able to use RPE daily in rehab to express subjective intensity level;Long Term:  Able to use RPE to guide intensity level when exercising independently       Knowledge and understanding of Target Heart Rate Range (THRR) Yes       Intervention Provide education and explanation of THRR including how the numbers were predicted and where they are located for reference       Expected Outcomes Short Term: Able to state/look up THRR;Long Term: Able to use THRR to govern intensity when exercising independently;Short Term: Able to use daily as guideline for intensity in rehab       Able to check pulse independently Yes       Intervention Review the importance of being able to check your own pulse for safety during independent exercise;Provide education and demonstration on how to check pulse in carotid and radial arteries.       Expected Outcomes Short Term: Able to explain why pulse checking is important during independent exercise;Long Term: Able to check pulse independently and accurately       Understanding of Exercise Prescription Yes       Intervention Provide  education, explanation, and written materials on patient's individual exercise prescription       Expected Outcomes Short Term: Able to explain program exercise prescription;Long Term: Able to explain home exercise prescription to exercise independently              Exercise Goals Re-Evaluation :  Exercise Goals Re-Evaluation    [place] Name 07/26/20 1454             Exercise Goal Re-Evaluation   Exercise Goals Review Increase Physical Activity;Increase Strength and Stamina;Able to understand and use rate of perceived exertion (RPE) scale;Knowledge and understanding of Target Heart Rate Range (THRR);Understanding of Exercise Prescription       Comments Pt's first day of exercise in the CRP2 program. Pt tolerated the session well and understands the RPE scale, THRR, and exercise Rx.       Expected Outcomes Will continue to monitor and progress pt workloads as tolerated.              Discharge Exercise Prescription (Final Exercise Prescription Changes):  Exercise Prescription Changes - 07/26/20 1400      Response to Exercise   Blood Pressure (Admit) 120/70    Blood Pressure (Exercise) 126/72    Blood Pressure (Exit) 127/80    Heart Rate (Admit) 63 bpm    Heart Rate (Exercise) 69 bpm    Heart Rate (Exit) 56 bpm    Rating of Perceived Exertion (Exercise) 11    Perceived Dyspnea (Exercise) 0    Symptoms None    Comments Pt's first day of exercise in the CRP2 program.     Duration Progress to 30 minutes of  aerobic without signs/symptoms of physical distress    Intensity THRR unchanged      Progression   Progression Continue to progress workloads to maintain intensity without signs/symptoms of physical distress.    Average METs 1.8      Resistance Training   Training Prescription Yes    Weight 3 lbs    Reps 10-15    Time 10 Minutes      Interval Training   Interval Training No      NuStep   Level 2    SPM 70  Minutes 30    METs 1.8           Nutrition:  Target  Goals: Understanding of nutrition guidelines, daily intake of sodium <1562m, cholesterol <2061m calories 30% from fat and 7% or less from saturated fats, daily to have 5 or more servings of fruits and vegetables.  Biometrics:  Pre Biometrics - 07/20/20 1220      Pre Biometrics   Waist Circumference 36 inches    Hip Circumference 38 inches    Waist to Hip Ratio 0.95 %    Triceps Skinfold 9 mm    % Body Fat 22.8 %    Grip Strength 38 kg    Flexibility 10 in    Single Leg Stand 30 seconds            Nutrition Therapy Plan and Nutrition Goals:  Nutrition Therapy & Goals - 07/30/20 1517      Nutrition Therapy   Diet TLC      Personal Nutrition Goals   Nutrition Goal Pt to build a healthy plate including vegetables, fruits, whole grains, and low-fat dairy products in a heart healthy meal plan.    Personal Goal #2 Buy frozen or canned fruits and veggies    Personal Goal #3 Choose Low sodium or in natural juices when choosing canned    Personal Goal #4 Choose olive oil and nuts daily      Intervention Plan   Intervention Prescribe, educate and counsel regarding individualized specific dietary modifications aiming towards targeted core components such as weight, hypertension, lipid management, diabetes, heart failure and other comorbidities.;Nutrition handout(s) given to patient.    Expected Outcomes Short Term Goal: Understand basic principles of dietary content, such as calories, fat, sodium, cholesterol and nutrients.           Nutrition Assessments:  MEDIFICTS Score Key:  ?70 Need to make dietary changes   40-70 Heart Healthy Diet  ? 40 Therapeutic Level Cholesterol Diet   Flowsheet Row CARDIAC REHAB [facility]ASE [facility] [facility] [place] [facility]     Picture Your Plate Scores:  <4<76nhealthy dietary pattern with much room for improvement.  41-50 Dietary pattern unlikely to meet  recommendations for good health and room for improvement.  51-60 More healthful dietary pattern, with some room for improvement.   >60 Healthy dietary pattern, although there may be some specific behaviors that could be improved.    Nutrition Goals Re-Evaluation:  Nutrition Goals Re-Evaluation    [place] 07/30/20 1518             Goals   Current Weight 157 lb (71.2 kg)       Nutrition Goal Pt to build a healthy plate including vegetables, fruits, whole grains, and low-fat dairy products in a heart healthy meal plan.               Personal Goal #2 Re-Evaluation   Personal Goal #2 Buy frozen or canned fruits and veggies               Personal Goal #3 Re-Evaluation   Personal Goal #3 Choose Low sodium or in natural juices when choosing canned               Personal Goal #4 Re-Evaluation   Personal Goal #4 Choose olive oil and nuts daily              Nutrition Goals Re-Evaluation:  Nutrition Goals Re-Evaluation    [place] Name 07/30/20 1518             Goals   Current Weight 157 lb (71.2 kg)       Nutrition Goal Pt to build a healthy plate including vegetables, fruits, whole grains, and low-fat dairy products in a heart healthy meal plan.               Personal Goal #2 Re-Evaluation   Personal Goal #2 Buy frozen or canned fruits and veggies               Personal Goal #3 Re-Evaluation   Personal Goal #3 Choose Low sodium or in natural juices when choosing canned               Personal Goal #4 Re-Evaluation   Personal Goal #4 Choose olive oil and nuts daily              Nutrition Goals Discharge (Final Nutrition Goals Re-Evaluation):  Nutrition Goals Re-Evaluation - 07/30/20 1518      Goals   Current Weight 157 lb (71.2 kg)    Nutrition Goal Pt to build a healthy plate including vegetables, fruits, whole grains, and low-fat dairy products in a heart healthy meal plan.      Personal Goal #2 Re-Evaluation   Personal Goal #2 Buy frozen or canned fruits and  veggies      Personal Goal #3 Re-Evaluation   Personal Goal #3 Choose Low sodium or in natural juices when choosing canned      Personal Goal #4 Re-Evaluation   Personal Goal #4 Choose olive oil and nuts daily           Psychosocial: Target Goals: Acknowledge presence or absence of significant depression and/or stress, maximize coping skills, provide positive support system. Participant is able to verbalize types and ability to use techniques and skills needed for reducing stress and depression.  Initial Review & Psychosocial Screening:  Initial Psych Review & Screening - 07/20/20 1331      Initial Review   Current issues with None Identified      Family Dynamics   Good Support System? Yes   Carley lives with his friend and has two children for support     Barriers   Psychosocial barriers to participate in program There are no identifiable barriers or psychosocial needs.      Screening Interventions   Interventions Encouraged to exercise           Quality of Life Scores:  Quality of Life - 07/20/20 1222      Quality of Life   Select Quality of Life      Quality of Life Scores   Health/Function Pre 24.4 %    Socioeconomic Pre 27.43 %    Psych/Spiritual Pre 30 %    Family Pre 30 %    GLOBAL Pre 27 %          Scores of 19 and below usually indicate a poorer quality of life in these areas.  A difference of  2-3 points is a clinically meaningful difference.  A difference of 2-3 points in the total score of the Quality of Life Index has been associated with significant improvement in overall quality of life, self-image, physical symptoms, and general health in studies assessing change in quality of life.  PHQ-9: Recent Review Flowsheet Data    Depression screen [facility] 2/9 07/20/2020 07/20/2020   Decreased Interest 0 0  Down, Depressed, Hopeless 0 0   PHQ - 2 Score 0 0     Interpretation of Total Score  Total Score Depression Severity:  1-4 = Minimal depression,  5-9 = Mild depression, 10-14 = Moderate depression, 15-19 = Moderately severe depression, 20-27 = Severe depression   Psychosocial Evaluation and Intervention:  Psychosocial Evaluation - 07/26/20 1537      Psychosocial Evaluation & Interventions   Interventions Encouraged to exercise with the program and follow exercise prescription    Comments Mr. Krummel denies psychosocial barriers to participation in cardiac rehab. He has a positive attitude and outlook. He is active and enjoys racing (cars) and playing with his dog. He acknolodges a strong support system of family and friends. No interventions needed at this time.    Expected Outcomes Patient will remain free from psychosocial barriers to participation in CR and self health management.    Continue Psychosocial Services  No Follow up required           Psychosocial Re-Evaluation:  Psychosocial Re-Evaluation    [place] Name 07/26/20 1539 08/03/20 1045           Psychosocial Re-Evaluation   Current issues with None Identified None Identified      Comments Mr. Delfin denies psychosocial barriers to participation in cardiac rehab. He has a positive attitude and outlook. He is active and enjoys racing (cars) and playing with his dog. He acknolodges a strong support system of family and friends. No interventions needed at this time. Mr. Ruffins denies psychosocial barriers to participation in cardiac rehab. He has a positive attitude and outlook. He is active and enjoys racing (cars) and playing with his dog. He acknolodges a strong support system of family and friends. No interventions needed at this time.      Expected Outcomes Patient will remain free from psychosocial barriers to participation in CR and self health management. Patient will remain free from psychosocial barriers to participation in CR and self health management.      Interventions Encouraged to attend Cardiac Rehabilitation for the exercise Encouraged to attend Cardiac  Rehabilitation for the exercise      Continue Psychosocial Services  No Follow up required No Follow up required             Psychosocial Discharge (Final Psychosocial Re-Evaluation):  Psychosocial Re-Evaluation - 08/03/20 1045      Psychosocial Re-Evaluation   Current issues with None Identified    Comments Mr. Rosenberry denies psychosocial barriers to participation in cardiac rehab. He has a positive attitude and outlook. He is active and enjoys racing (cars) and playing with his dog. He acknolodges a strong support system of family and friends. No interventions needed at this time.    Expected Outcomes Patient will remain free from psychosocial barriers to participation in CR and self health management.    Interventions Encouraged to attend Cardiac Rehabilitation for the exercise    Continue Psychosocial Services  No Follow up required           Vocational Rehabilitation: Provide vocational rehab assistance to qualifying candidates.   Vocational Rehab Evaluation & Intervention:  Vocational Rehab - 07/20/20 1337      Initial Vocational Rehab Evaluation & Intervention   Assessment shows need for Vocational Rehabilitation No   Kortland is retired and does not need vocational reahb at this time          Education: Education Goals: Education classes will be provided on a weekly basis, covering required topics.  Participant will state understanding/return demonstration of topics presented.  Learning Barriers/Preferences:  Learning Barriers/Preferences - 07/20/20 1222      Learning Barriers/Preferences   Learning Barriers Sight   Reading glasses   Learning Preferences Group Instruction;Individual Instruction;Skilled Demonstration           Education Topics: Count Your Pulse:  -Group instruction provided by verbal instruction, demonstration, patient participation and written materials to support subject.  Instructors address importance of being able to find your pulse and how  to count your pulse when at home without a heart monitor.  Patients get hands on experience counting their pulse with staff help and individually.   Heart Attack, Angina, and Risk Factor Modification:  -Group instruction provided by verbal instruction, video, and written materials to support subject.  Instructors address signs and symptoms of angina and heart attacks.    Also discuss risk factors for heart disease and how to make changes to improve heart health risk factors.   Functional Fitness:  -Group instruction provided by verbal instruction, demonstration, patient participation, and written materials to support subject.  Instructors address safety measures for doing things around the house.  Discuss how to get up and down off the floor, how to pick things up properly, how to safely get out of a chair without assistance, and balance training.   Meditation and Mindfulness:  -Group instruction provided by verbal instruction, patient participation, and written materials to support subject.  Instructor addresses importance of mindfulness and meditation practice to help reduce stress and improve awareness.  Instructor also leads participants through a meditation exercise.    Stretching for Flexibility and Mobility:  -Group instruction provided by verbal instruction, patient participation, and written materials to support subject.  Instructors lead participants through series of stretches that are designed to increase flexibility thus improving mobility.  These stretches are additional exercise for major muscle groups that are typically performed during regular warm up and cool down.   Hands Only CPR:  -Group verbal, video, and participation provides a basic overview of AHA guidelines for community CPR. Role-play of emergencies allow participants the opportunity to practice calling for help and chest compression technique with discussion of AED use.   Hypertension: -Group verbal and written  instruction that provides a basic overview of hypertension including the most recent diagnostic guidelines, risk factor reduction with self-care instructions and medication management.    Nutrition I class: Heart Healthy Eating:  -Group instruction provided by PowerPoint slides, verbal discussion, and written materials to support subject matter. The instructor gives an explanation and review of the Therapeutic Lifestyle Changes diet recommendations, which includes a discussion on lipid goals, dietary fat, sodium, fiber, plant stanol/sterol esters, sugar, and the components of a well-balanced, healthy diet.   Nutrition II class: Lifestyle Skills:  -Group instruction provided by PowerPoint slides, verbal discussion, and written materials to support subject matter. The instructor gives an explanation and review of label reading, grocery shopping for heart health, heart healthy recipe modifications, and ways to make healthier choices when eating out.   Diabetes Question & Answer:  -Group instruction provided by PowerPoint slides, verbal discussion, and written materials to support subject matter. The instructor gives an explanation and review of diabetes co-morbidities, pre- and post-prandial blood glucose goals, pre-exercise blood glucose goals, signs, symptoms, and treatment of hypoglycemia and hyperglycemia, and foot care basics.   Diabetes Blitz:  -Group instruction provided by PowerPoint slides, verbal discussion, and written materials to support subject matter. The instructor gives an explanation and review  of the physiology behind type 1 and type 2 diabetes, diabetes medications and rational behind using different medications, pre- and post-prandial blood glucose recommendations and Hemoglobin A1c goals, diabetes diet, and exercise including blood glucose guidelines for exercising safely.    Portion Distortion:  -Group instruction provided by PowerPoint slides, verbal discussion, written  materials, and food models to support subject matter. The instructor gives an explanation of serving size versus portion size, changes in portions sizes over the last 20 years, and what consists of a serving from each food group.   Stress Management:  -Group instruction provided by verbal instruction, video, and written materials to support subject matter.  Instructors review role of stress in heart disease and how to cope with stress positively.     Exercising on Your Own:  -Group instruction provided by verbal instruction, power point, and written materials to support subject.  Instructors discuss benefits of exercise, components of exercise, frequency and intensity of exercise, and end points for exercise.  Also discuss use of nitroglycerin and activating EMS.  Review options of places to exercise outside of rehab.  Review guidelines for sex with heart disease.   Cardiac Drugs I:  -Group instruction provided by verbal instruction and written materials to support subject.  Instructor reviews cardiac drug classes: antiplatelets, anticoagulants, beta blockers, and statins.  Instructor discusses reasons, side effects, and lifestyle considerations for each drug class.   Cardiac Drugs II:  -Group instruction provided by verbal instruction and written materials to support subject.  Instructor reviews cardiac drug classes: angiotensin converting enzyme inhibitors (ACE-I), angiotensin II receptor blockers (ARBs), nitrates, and calcium channel blockers.  Instructor discusses reasons, side effects, and lifestyle considerations for each drug class.   Anatomy and Physiology of the Circulatory System:  Group verbal and written instruction and models provide basic cardiac anatomy and physiology, with the coronary electrical and arterial systems. Review of: AMI, Angina, Valve disease, Heart Failure, Peripheral Artery Disease, Cardiac Arrhythmia, Pacemakers, and the ICD.   Other Education:  -Group or  individual verbal, written, or video instructions that support the educational goals of the cardiac rehab program.   Holiday Eating Survival Tips:  -Group instruction provided by PowerPoint slides, verbal discussion, and written materials to support subject matter. The instructor gives patients tips, tricks, and techniques to help them not only survive but enjoy the holidays despite the onslaught of food that accompanies the holidays.   Knowledge Questionnaire Score:  Knowledge Questionnaire Score - 07/20/20 1223      Knowledge Questionnaire Score   Pre Score 23/28           Core Components/Risk Factors/Patient Goals at Admission:  Personal Goals and Risk Factors at Admission - 07/20/20 1224      Core Components/Risk Factors/Patient Goals on Admission    Weight Management Weight Maintenance    Hypertension Yes    Intervention Provide education on lifestyle modifcations including regular physical activity/exercise, weight management, moderate sodium restriction and increased consumption of fresh fruit, vegetables, and low fat dairy, alcohol moderation, and smoking cessation.;Monitor prescription use compliance.    Expected Outcomes Short Term: Continued assessment and intervention until BP is < 140/23m HG in hypertensive participants. < 130/83mHG in hypertensive participants with diabetes, heart failure or chronic kidney disease.;Long Term: Maintenance of blood pressure at goal levels.    Lipids Yes    Intervention Provide education and support for participant on nutrition & aerobic/resistive exercise along with prescribed medications to achieve LDL <7036mHDL >84m52m  Expected  Outcomes Short Term: Participant states understanding of desired cholesterol values and is compliant with medications prescribed. Participant is following exercise prescription and nutrition guidelines.;Long Term: Cholesterol controlled with medications as prescribed, with individualized exercise RX and with  personalized nutrition plan. Value goals: LDL < 31m, HDL > 40 mg.           Core Components/Risk Factors/Patient Goals Review:   Goals and Risk Factor Review    Row Name 07/26/20 1540 08/03/20 1046           Core Components/Risk Factors/Patient Goals Review   Personal Goals Review Weight Management/Obesity;Hypertension;Lipids Weight Management/Obesity;Hypertension;Lipids;Tobacco Cessation      Review Ms. Seal has multiple CAD risk factors. He is taking his medications as prescribed to manage his lipids and hypertension. He will meet with RD to discuss heart healthy, low sodium diet over the next few CR sessions. BP remains WNL at rest and during exertion. Expect to see progression towards CAD risk factor management goals over the next 30 days. Ms. PMcpartlandhas multiple CAD risk factors. He is taking his medications as prescribed to manage his lipids and hypertension. He has met with RD to discuss heart healthy, low sodium diet. BP remains WNL at rest and during exertion. Continues to smoke 3-4 cigarettes daily. He has been provided counseling however is not ready to quit at this time. Will continue to assess for readiness to quit. Weight remains stable at 70.3, admission weight 69.7.  Expect to see progression towards CAD risk factor management goals over the next 30 days.      Expected Outcomes Patient will continue to participate in CR for risk factor modification. Patient will continue to participate in CR for risk factor modification.             Core Components/Risk Factors/Patient Goals at Discharge (Final Review):   Goals and Risk Factor Review - 08/03/20 1046      Core Components/Risk Factors/Patient Goals Review   Personal Goals Review Weight Management/Obesity;Hypertension;Lipids;Tobacco Cessation    Review Ms. Pasko has multiple CAD risk factors. He is taking his medications as prescribed to manage his lipids and hypertension. He has met with RD to discuss heart healthy,  low sodium diet. BP remains WNL at rest and during exertion. Continues to smoke 3-4 cigarettes daily. He has been provided counseling however is not ready to quit at this time. Will continue to assess for readiness to quit. Weight remains stable at 70.3, admission weight 69.7.  Expect to see progression towards CAD risk factor management goals over the next 30 days.    Expected Outcomes Patient will continue to participate in CR for risk factor modification.           ITP Comments:  ITP Comments    Row Name 07/20/20 1014 07/26/20 1534 08/03/20 1035       ITP Comments Dr TFransico HimMD, Medical Director Mr. PSchneckcompleted his first cardiac rehab exercise session today and tolerated well. Worked to an RPE of 11 with 1.8 mets. Strength training and stretches completed. VSS. Denied complaints. Mr. PKoellergoals are to increase his energy and continue to be active and enjoy family and friends. Mr. PCartlidgehas completed 4 cardiac rehab exercise sessions since admission. He is tolerating exercise well. VSS and WNL. Works to an RPE of 11 however mets remain lower than walk test mets. He is averaging 1.9 exercising 30 min on the nustep; met 2.5 on 6 min walk test. He has been encouraged to increase his  SPM by RN and EP. His goals are to increase his energy and continue to remain active with his family. Continues to need encouragement. Hope to see progression towards goals over the next 30 days. Will continue to reinforce need for working to an RPE of 11-13 inorder to increase stamina and strength. He continues to smoke 3-4 cigarettes daily. Ongoing counseling provided.            Comments: see ITP and RF/Goal Comments

## 2020-08-04 ENCOUNTER — Encounter (HOSPITAL_COMMUNITY)
Admission: RE | Admit: 2020-08-04 | Discharge: 2020-08-04 | Disposition: A | Discharge status: Home / Self Care | Payer: Medicare HMO | Source: Ambulatory Visit | Attending: Cardiology | Admitting: Cardiology

## 2020-08-04 ENCOUNTER — Other Ambulatory Visit: Payer: Self-pay

## 2020-08-04 DIAGNOSIS — Z8679 Personal history of other diseases of the circulatory system: Secondary | ICD-10-CM

## 2020-08-04 DIAGNOSIS — Z953 Presence of xenogenic heart valve: Secondary | ICD-10-CM

## 2020-08-04 DIAGNOSIS — Z9889 Other specified postprocedural states: Secondary | ICD-10-CM | POA: Diagnosis not present

## 2020-08-05 ENCOUNTER — Other Ambulatory Visit: Payer: Medicare HMO

## 2020-08-06 ENCOUNTER — Other Ambulatory Visit: Payer: Self-pay

## 2020-08-06 ENCOUNTER — Encounter (HOSPITAL_COMMUNITY)
Admission: RE | Admit: 2020-08-06 | Discharge: 2020-08-06 | Disposition: A | Discharge status: Home / Self Care | Payer: Medicare HMO | Source: Ambulatory Visit | Attending: Cardiology | Admitting: Cardiology

## 2020-08-06 DIAGNOSIS — Z9889 Other specified postprocedural states: Secondary | ICD-10-CM | POA: Diagnosis not present

## 2020-08-06 DIAGNOSIS — Z953 Presence of xenogenic heart valve: Secondary | ICD-10-CM

## 2020-08-09 ENCOUNTER — Encounter (HOSPITAL_COMMUNITY): Payer: Medicare HMO

## 2020-08-09 ENCOUNTER — Telehealth (HOSPITAL_COMMUNITY): Payer: Self-pay | Admitting: Family Medicine

## 2020-08-09 ENCOUNTER — Other Ambulatory Visit: Payer: Medicare HMO

## 2020-08-11 ENCOUNTER — Encounter (HOSPITAL_COMMUNITY): Payer: Medicare HMO

## 2020-08-11 ENCOUNTER — Telehealth (HOSPITAL_COMMUNITY): Payer: Self-pay | Admitting: *Deleted

## 2020-08-11 NOTE — Telephone Encounter (Signed)
Spoke with Michaeljames he getting tested for COVID 19 tomorrow through the health department and currently has no symptoms. Asked patient to bring a copy of his COVID 19 test prior to returning to exercise. Patient states understanding. Will follow up with the patient next week. Keddrick will not exercise today.Barnet Pall, RN,BSN 08/11/2020 10:50 AM

## 2020-08-12 ENCOUNTER — Other Ambulatory Visit: Payer: Self-pay

## 2020-08-12 DIAGNOSIS — Z20822 Contact with and (suspected) exposure to covid-19: Secondary | ICD-10-CM

## 2020-08-14 LAB — NOVEL CORONAVIRUS, NAA: SARS-CoV-2, NAA: DETECTED — AB

## 2020-08-14 LAB — SARS-COV-2, NAA 2 DAY TAT

## 2020-08-16 ENCOUNTER — Telehealth (HOSPITAL_COMMUNITY): Payer: Self-pay

## 2020-08-16 ENCOUNTER — Encounter (HOSPITAL_COMMUNITY): Payer: Medicare HMO

## 2020-08-16 NOTE — Telephone Encounter (Signed)
Cardiac Rehab Note:  Incoming telephone call from Sanjuana Letters as requested. Mr. Letizia states he received a positive covid test 08/12/20. Someone in his household is positive for Covid-19. Patient states he is fully vaccinated and asymptomatic from the virus. He is instructed to remain absent from cardiac rehab for 10 days post positive test if he remains asymptomatic. This would allow him to return to CR 08/24/19. Patient will keep CR staff updated if any changes in his health should occur.  Ariz Terrones E. Suzie Portela RN, BSN [place]. [facility]  Cardiac and Pulmonary Rehabilitation Phone: [phone] Fax: [phone]

## 2020-08-18 ENCOUNTER — Encounter (HOSPITAL_COMMUNITY): Payer: Medicare HMO

## 2020-08-23 ENCOUNTER — Encounter (HOSPITAL_COMMUNITY): Payer: Medicare HMO

## 2020-08-25 ENCOUNTER — Other Ambulatory Visit: Payer: Self-pay

## 2020-08-25 ENCOUNTER — Encounter (HOSPITAL_COMMUNITY)
Admission: RE | Admit: 2020-08-25 | Discharge: 2020-08-25 | Disposition: A | Discharge status: Home / Self Care | Payer: Medicare HMO | Source: Ambulatory Visit | Attending: Cardiology | Admitting: Cardiology

## 2020-08-25 DIAGNOSIS — Z952 Presence of prosthetic heart valve: Secondary | ICD-10-CM | POA: Diagnosis not present

## 2020-08-27 ENCOUNTER — Encounter (HOSPITAL_COMMUNITY)
Admission: RE | Admit: 2020-08-27 | Discharge: 2020-08-27 | Disposition: A | Discharge status: Home / Self Care | Payer: Medicare HMO | Source: Ambulatory Visit | Attending: Cardiology | Admitting: Cardiology

## 2020-08-27 ENCOUNTER — Other Ambulatory Visit: Payer: Self-pay

## 2020-08-27 DIAGNOSIS — Z953 Presence of xenogenic heart valve: Secondary | ICD-10-CM

## 2020-08-27 DIAGNOSIS — Z952 Presence of prosthetic heart valve: Secondary | ICD-10-CM | POA: Diagnosis not present

## 2020-08-27 DIAGNOSIS — Z8679 Personal history of other diseases of the circulatory system: Secondary | ICD-10-CM

## 2020-08-27 NOTE — Progress Notes (Signed)
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  acetaminophen (TYLENOL) 325 MG tablet, Take 2 tablets (650 mg total) by mouth every 6 (six) hours as needed for mild pain., Disp: , Rfl:  .  albuterol (VENTOLIN HFA) 108 (90 Base) MCG/ACT inhaler, Inhale 1 puff into the lungs every 4 (four) hours as needed for wheezing or shortness of breath. , Disp: , Rfl:  .  aspirin EC 81 MG EC tablet, Take 1 tablet (81 mg total) by mouth daily. Swallow whole., Disp: , Rfl:  .  Cholecalciferol (VITAMIN D3) 1.25 MG (50000 UT) CAPS, Take 50,000 Units by mouth once a week. , Disp: , Rfl:  .  fluticasone (FLONASE) 50 MCG/ACT nasal spray, Place 1 spray into both nostrils daily as needed for rhinitis. , Disp: , Rfl:  .  magnesium oxide (MAG-OX) 400 (241.3 Mg) MG tablet, Take 1 tablet (400 mg total) by mouth 2 (two) times daily., Disp: 30 tablet, Rfl: 0 .  metoprolol tartrate (LOPRESSOR) 25 MG tablet, Take 0.5 tablets (12.5 mg total) by mouth 2 (two) times daily., Disp: 90 tablet, Rfl: 3 .  nicotine (NICODERM CQ - DOSED IN MG/24 HOURS) 21 mg/24hr patch, Place 21 mg onto the skin daily. , Disp: , Rfl:  .  rosuvastatin (CRESTOR) 5 MG tablet, Take 1 tablet (5 mg total) by mouth daily., Disp: 90 tablet, Rfl: 3 .  tamsulosin (FLOMAX) 0.4 MG CAPS capsule, Take 0.4 mg by mouth daily. , Disp: , Rfl:    Past Medical History: Past Medical History:  Diagnosis Date  . Centrilobular emphysema (HCC)   . Hyperlipidemia   . Hypertension   . S/P ascending aortic aneurysm repair 04/14/2020  . 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 Severe aortic stenosis   . Thoracic ascending aortic aneurysm (HCC)   . Tobacco abuse     Tobacco Use: Social History   Tobacco Use  Smoking Status Current Some Day Smoker  . Packs/day: 0.25  . Years: 50.00  . Pack years: 12.50  . Types: Cigarettes  Smokeless Tobacco Never Used  Tobacco Comment   2 a day    Labs: Recent Review Flowsheet Data    Labs for ITP Cardiac and Pulmonary Rehab Latest Ref Rng & Units 04/14/2020 04/14/2020 04/14/2020 04/14/2020 04/14/2020   Cholestrol 100 - 199 mg/dL - - - - -   LDLCALC 0 - 99 mg/dL - - - - -   HDL >40 mg/dL - - - - -   Trlycerides 0 - 149 mg/dL - - - - -   Hemoglobin A1c 4.8 - 5.6 % - - - - -   PHART 7.350 - 7.450 7.364 - 7.324(L) 7.327(L) 7.322(L)   PCO2ART 32.0 - 48.0 mmHg 45.4 - 45.4 42.8 44.3  HCO3 20.0 - 28.0 mmol/L 25.9 - 24.0 22.3 22.7   TCO2 22 - 32 mmol/L 27 24 25 24 24    ACIDBASEDEF 0.0 - 2.0 mmol/L - - 3.0(H) 3.0(H) 3.0(H)   O2SAT % 100.0 - 96.0 97.0 99.0      Capillary Blood Glucose: Lab Results  Component Value Date   GLUCAP 121 (H) 04/16/2020   GLUCAP 116 (H) 04/16/2020   GLUCAP 83 04/16/2020   GLUCAP 93 04/16/2020   GLUCAP 110 (H) 04/16/2020     Exercise Target Goals: Exercise Program Goal: Individual exercise prescription set using results from initial 6 min walk test and THRR while considering  patient's activity barriers and safety.   Exercise Prescription Goal: Starting with aerobic activity 30 plus minutes a day, 3 days per week for initial exercise prescription. Provide home exercise  prescription and guidelines that participant acknowledges understanding prior to discharge.  Activity Barriers & Risk Stratification:  Activity Barriers & Cardiac Risk Stratification - 07/20/20 1227      Activity Barriers & Cardiac Risk Stratification   Activity Barriers Arthritis    Cardiac Risk Stratification High           6 Minute Walk:  6 Minute Walk    Row Name 07/20/20 1050         6 Minute Walk   Phase Initial     Distance 1180 feet     Walk Time 6 minutes     # of Rest Breaks 0     MPH 2.2     METS 2.5     RPE 11     Perceived Dyspnea  0     VO2 Peak 8.91     Symptoms No     Resting HR 62 bpm     Resting BP 118/74     Resting Oxygen Saturation  100 %     Exercise Oxygen Saturation  during 6 min walk 99 %     Max Ex. HR 90 bpm     Max Ex. BP 126/76     2 Minute Post BP 120/70            Oxygen Initial Assessment:   Oxygen Re-Evaluation:   Oxygen Discharge (Final Oxygen Re-Evaluation):   Initial Exercise Prescription:  Initial Exercise Prescription - 07/20/20 1300      Date of Initial Exercise RX and Referring Provider   Date 07/20/20    Referring Provider Yates Decamp, MD    Expected Discharge Date 09/17/20      NuStep   Level 2    SPM 85    Minutes 30    METs 2      Prescription Details   Frequency (times per week) 3    Duration Progress to 30 minutes of continuous aerobic without signs/symptoms of physical distress      Intensity   THRR 40-80% of Max Heartrate 59-118    Ratings of Perceived Exertion 11-13    Perceived Dyspnea 0-4      Progression   Progression Continue progressive overload as per policy without signs/symptoms or physical distress.      Resistance Training   Training Prescription Yes    Weight 3 lbs    Reps 10-15           Perform Capillary Blood Glucose checks as needed.  Exercise Prescription Changes:   Exercise Prescription Changes    Row Name 07/26/20 1400 08/25/20 1440           Response  to  Exercise   Blood Pressure (Admit) 120/70 124/84      Blood Pressure (Exercise) 126/72 128/76      Blood Pressure (Exit) 127/80 120/70      Heart Rate (Admit) 63 bpm 69 bpm      Heart Rate (Exercise) 69 bpm 71 bpm      Heart Rate (Exit) 56 bpm 69 bpm      Rating of Perceived Exertion (Exercise) 11 11      Perceived Dyspnea (Exercise) 0 --      Symptoms None None      Comments Pt's first day of exercise in the CRP2 program.  Riewed METs/Goals      Duration Progress to 30 minutes of  aerobic without signs/symptoms of physical distress Progress to 30 minutes of  aerobic without signs/symptoms of physical distress      Intensity THRR unchanged THRR unchanged             Progression   Progression Continue to progress workloads to maintain intensity without signs/symptoms of physical distress. Continue to progress workloads to maintain intensity without signs/symptoms of physical distress.      Average METs 1.8 2.3             Resistance Training   Training Prescription Yes No      Weight 3 lbs No weights done on Wednesday      Reps 10-15 --      Time 10 Minutes --             Interval Training   Interval Training No No             NuStep   Level 2 3      SPM 70 80      Minutes 30 30      METs 1.8 2.3             Exercise Comments:   Exercise Comments    Row Name 07/26/20 1456 08/25/20 1440         Exercise Comments Pt's first day of exercise in the CRP2 program. Pt tolerated the exercise session well. Reviewed METs and goals. Pt returning after absence due to COVID-19 exposure. Pt is eager to resume his exercise. Will monitor and progress as tolerated.             Exercise Goals and Review:   Exercise Goals    Row Name 07/20/20 1311             Exercise Goals   Increase Physical Activity Yes       Intervention Provide advice, education, support and counseling about physical activity/exercise needs.;Develop an individualized exercise prescription for aerobic  and resistive training based on initial evaluation findings, risk stratification, comorbidities and participant's personal goals.       Expected Outcomes Short Term: Attend rehab on a regular basis to increase amount of physical activity.;Long Term: Add in home exercise to make exercise part of routine and to increase amount of physical activity.;Long Term: Exercising regularly at least 3-5 days a week.       Increase Strength and Stamina Yes       Intervention Provide advice, education, support and counseling about physical activity/exercise needs.;Develop an individualized exercise prescription for aerobic and resistive training based on initial evaluation findings, risk stratification, comorbidities and participant's personal goals.       Expected Outcomes Short Term: Increase workloads from initial exercise prescription for resistance, speed, and METs.;Short Term: Perform resistance  training exercises routinely during rehab and add in resistance training at home;Long Term: Improve cardiorespiratory fitness, muscular endurance and strength as measured by increased METs and functional capacity ( )       Able to understand and use rate of perceived exertion (RPE) scale Yes       Intervention Provide education and explanation on how to use RPE scale       Expected Outcomes Short Term: Able to use RPE daily in rehab to express subjective intensity level;Long Term:  Able to use RPE to guide intensity level when exercising independently       Knowledge and understanding of Target Heart Rate Range (THRR) Yes       Intervention Provide education and explanation of THRR including how the numbers were predicted and where they are located for reference       Expected Outcomes Short Term: Able to state/look up THRR;Long Term: Able to use THRR to govern intensity when exercising independently;Short Term: Able to use daily as guideline for intensity in rehab       Able to check pulse independently Yes        Intervention Review the importance of being able to check your own pulse for safety during independent exercise;Provide education and demonstration on how to check pulse in carotid and radial arteries.       Expected Outcomes Short Term: Able to explain why pulse checking is important during independent exercise;Long Term: Able to check pulse independently and accurately       Understanding of Exercise Prescription Yes       Intervention Provide education, explanation, and written materials on patient's individual exercise prescription       Expected Outcomes Short Term: Able to explain program exercise prescription;Long Term: Able to explain home exercise prescription to exercise independently              Exercise Goals Re-Evaluation :  Exercise Goals Re-Evaluation    Row Name 07/26/20 1454 08/25/20 1440           Exercise Goal Re-Evaluation   Exercise Goals Review Increase Physical Activity;Increase Strength and Stamina;Able to understand and use rate of perceived exertion (RPE) scale;Knowledge and understanding of Target Heart Rate Range (THRR);Understanding of Exercise Prescription Increase Physical Activity;Increase Strength and Stamina;Able to understand and use rate of perceived exertion (RPE) scale;Knowledge and understanding of Target Heart Rate Range (THRR);Understanding of Exercise Prescription      Comments Pt's first day of exercise in the CRP2 program. Pt tolerated the session well and understands the RPE scale, THRR, and exercise Rx. Reviewed METs and Goals. Pt has not been here for several weeks due to COVID-19 exposure. Pt is eager to contiune his CRP2 program.      Expected Outcomes Will continue to monitor and progress pt workloads as tolerated. Will continue to monitor and progress pt workloads as tolerated.              Discharge Exercise Prescription (Final Exercise Prescription Changes):  Exercise Prescription Changes - 08/25/20 1440      Response to Exercise    Blood Pressure (Admit) 124/84    Blood Pressure (Exercise) 128/76    Blood Pressure (Exit) 120/70    Heart Rate (Admit) 69 bpm    Heart Rate (Exercise) 71 bpm    Heart Rate (Exit) 69 bpm    Rating of Perceived Exertion (Exercise) 11    Symptoms None    Comments Riewed METs/Goals    Duration Progress to 30  minutes of  aerobic without signs/symptoms of physical distress    Intensity THRR unchanged      Progression   Progression Continue to progress workloads to maintain intensity without signs/symptoms of physical distress.    Average METs 2.3      Resistance Training   Training Prescription No    Weight No weights done on Wednesday      Interval Training   Interval Training No      NuStep   Level 3    SPM 80    Minutes 30    METs 2.3           Nutrition:  Target Goals: Understanding of nutrition guidelines, daily intake of sodium 1500mg , cholesterol 200mg , calories 30% from fat and 7% or less from saturated fats, daily to have 5 or more servings of fruits and vegetables.  Biometrics:  Pre Biometrics - 07/20/20 1220      Pre Biometrics   Waist Circumference 36 inches    Hip Circumference 38 inches    Waist to Hip Ratio 0.95 %    Triceps Skinfold 9 mm    % Body Fat 22.8 %    Grip Strength 38 kg    Flexibility 10 in    Single Leg Stand 30 seconds            Nutrition Therapy Plan and Nutrition Goals:  Nutrition Therapy & Goals - 07/30/20 1517      Nutrition Therapy   Diet TLC      Personal Nutrition Goals   Nutrition Goal Pt to build a healthy plate including vegetables, fruits, whole grains, and low-fat dairy products in a heart healthy meal plan.    Personal Goal #2 Buy frozen or canned fruits and veggies    Personal Goal #3 Choose Low sodium or in natural juices when choosing canned    Personal Goal #4 Choose olive oil and nuts daily      Intervention Plan   Intervention Prescribe, educate and counsel regarding individualized specific dietary  modifications aiming towards targeted core components such as weight, hypertension, lipid management, diabetes, heart failure and other comorbidities.;Nutrition handout(s) given to patient.    Expected Outcomes Short Term Goal: Understand basic principles of dietary content, such as calories, fat, sodium, cholesterol and nutrients.           Nutrition Assessments:  MEDIFICTS Score Key:  ?70 Need to make dietary changes   40-70 Heart Healthy Diet  ? 40 Therapeutic Level Cholesterol Diet  Flowsheet Row CARDIAC REHAB PHASE II EXERCISE from 07/30/2020 in [facility] CARDIAC REHAB  Picture Your Plate Total Score on Admission 33     Picture Your Plate Scores:  <86 Unhealthy dietary pattern with much room for improvement.  41-50 Dietary pattern unlikely to meet recommendations for good health and room for improvement.  51-60 More healthful dietary pattern, with some room for improvement.   >60 Healthy dietary pattern, although there may be some specific behaviors that could be improved.    Nutrition Goals Re-Evaluation:  Nutrition Goals Re-Evaluation    Row Name 07/30/20 1518 08/30/20 1433           Goals   Current Weight 157 lb (71.2 kg) 153 lb (69.4 kg)      Nutrition Goal Pt to build a healthy plate including vegetables, fruits, whole grains, and low-fat dairy products in a heart healthy meal plan. Pt to build a healthy plate including vegetables, fruits, whole grains, and low-fat dairy  products in a heart healthy meal plan.      Comment -- Pt has been trying to choose more low sodium canned foods. Reviewed label reading education.             Personal Goal #2 Re-Evaluation   Personal Goal #2 Buy frozen or canned fruits and veggies Buy frozen or canned fruits and veggies             Personal Goal #3 Re-Evaluation   Personal Goal #3 Choose Low sodium or in natural juices when choosing canned Choose Low sodium or in natural juices when choosing canned              Personal Goal #4 Re-Evaluation   Personal Goal #4 Choose olive oil and nuts daily Choose olive oil and nuts daily             Nutrition Goals Discharge (Final Nutrition Goals Re-Evaluation):  Nutrition Goals Re-Evaluation - 08/30/20 1433      Goals   Current Weight 153 lb (69.4 kg)    Nutrition Goal Pt to build a healthy plate including vegetables, fruits, whole grains, and low-fat dairy products in a heart healthy meal plan.    Comment Pt has been trying to choose more low sodium canned foods. Reviewed label reading education.      Personal Goal #2 Re-Evaluation   Personal Goal #2 Buy frozen or canned fruits and veggies      Personal Goal #3 Re-Evaluation   Personal Goal #3 Choose Low sodium or in natural juices when choosing canned      Personal Goal #4 Re-Evaluation   Personal Goal #4 Choose olive oil and nuts daily           Psychosocial: Target Goals: Acknowledge presence or absence of significant depression and/or stress, maximize coping skills, provide positive support system. Participant is able to verbalize types and ability to use techniques and skills needed for reducing stress and depression.  Initial Review & Psychosocial Screening:  Initial Psych Review & Screening - 07/20/20 1331      Initial Review   Current issues with None Identified      Family Dynamics   Good Support System? Yes   Rishi lives with his friend and has two children for support     Barriers   Psychosocial barriers to participate in program There are no identifiable barriers or psychosocial needs.      Screening Interventions   Interventions Encouraged to exercise           Quality of Life Scores:  Quality of Life - 07/20/20 1222      Quality of Life   Select Quality of Life      Quality of Life Scores   Health/Function Pre 24.4 %    Socioeconomic Pre 27.43 %    Psych/Spiritual Pre 30 %    Family Pre 30 %    GLOBAL Pre 27 %          Scores of 19 and below  usually indicate a poorer quality of life in these areas.  A difference of  2-3 points is a clinically meaningful difference.  A difference of 2-3 points in the total score of the Quality of Life Index has been associated with significant improvement in overall quality of life, self-image, physical symptoms, and general health in studies assessing change in quality of life.  PHQ-9: Recent Review Flowsheet Data    Depression screen [facility] 2/9 07/20/2020 07/20/2020   Decreased  Interest 0 0   Down, Depressed, Hopeless 0 0   PHQ - 2 Score 0 0     Interpretation of Total Score  Total Score Depression Severity:  1-4 = Minimal depression, 5-9 = Mild depression, 10-14 = Moderate depression, 15-19 = Moderately severe depression, 20-27 = Severe depression   Psychosocial Evaluation and Intervention:  Psychosocial Evaluation - 07/26/20 1537      Psychosocial Evaluation & Interventions   Interventions Encouraged to exercise with the program and follow exercise prescription    Comments Mr. Strommer denies psychosocial barriers to participation in cardiac rehab. He has a positive attitude and outlook. He is active and enjoys racing (cars) and playing with his dog. He acknolodges a strong support system of family and friends. No interventions needed at this time.    Expected Outcomes Patient will remain free from psychosocial barriers to participation in CR and self health management.    Continue Psychosocial Services  No Follow up required           Psychosocial Re-Evaluation:  Psychosocial Re-Evaluation    Row Name 07/26/20 1539 08/03/20 1045 08/27/20 1532         Psychosocial Re-Evaluation   Current issues with None Identified None Identified None Identified     Comments Mr. Pierman denies psychosocial barriers to participation in cardiac rehab. He has a positive attitude and outlook. He is active and enjoys racing (cars) and playing with his dog. He acknolodges a strong support system of family  and friends. No interventions needed at this time. Mr. Prante denies psychosocial barriers to participation in cardiac rehab. He has a positive attitude and outlook. He is active and enjoys racing (cars) and playing with his dog. He acknolodges a strong support system of family and friends. No interventions needed at this time. --     Expected Outcomes Patient will remain free from psychosocial barriers to participation in CR and self health management. Patient will remain free from psychosocial barriers to participation in CR and self health management. --     Interventions Encouraged to attend Cardiac Rehabilitation for the exercise Encouraged to attend Cardiac Rehabilitation for the exercise Encouraged to attend Cardiac Rehabilitation for the exercise     Continue Psychosocial Services  No Follow up required No Follow up required No Follow up required            Psychosocial Discharge (Final Psychosocial Re-Evaluation):  Psychosocial Re-Evaluation - 08/27/20 1532      Psychosocial Re-Evaluation   Current issues with None Identified    Interventions Encouraged to attend Cardiac Rehabilitation for the exercise    Continue Psychosocial Services  No Follow up required           Vocational Rehabilitation: Provide vocational rehab assistance to qualifying candidates.   Vocational Rehab Evaluation & Intervention:  Vocational Rehab - 07/20/20 1337      Initial Vocational Rehab Evaluation & Intervention   Assessment shows need for Vocational Rehabilitation No   Darious is retired and does not need vocational reahb at this time          Education: Education Goals: Education classes will be provided on a weekly basis, covering required topics. Participant will state understanding/return demonstration of topics presented.  Learning Barriers/Preferences:  Learning Barriers/Preferences - 07/20/20 1222      Learning Barriers/Preferences   Learning Barriers Sight   Reading glasses    Learning Preferences Group Instruction;Individual Instruction;Skilled Demonstration           Education Topics:  Hypertension, Hypertension Reduction -Define heart disease and high blood pressure. Discus how high blood pressure affects the body and ways to reduce high blood pressure.   Exercise and Your Heart -Discuss why it is important to exercise, the FITT principles of exercise, normal and abnormal responses to exercise, and how to exercise safely.   Angina -Discuss definition of angina, causes of angina, treatment of angina, and how to decrease risk of having angina.   Cardiac Medications -Review what the following cardiac medications are used for, how they affect the body, and side effects that may occur when taking the medications.  Medications include Aspirin, Beta blockers, calcium channel blockers, ACE Inhibitors, angiotensin receptor blockers, diuretics, digoxin, and antihyperlipidemics.   Congestive Heart Failure -Discuss the definition of CHF, how to live with CHF, the signs and symptoms of CHF, and how keep track of weight and sodium intake.   Heart Disease and Intimacy -Discus the effect sexual activity has on the heart, how changes occur during intimacy as we age, and safety during sexual activity.   Smoking Cessation / COPD -Discuss different methods to quit smoking, the health benefits of quitting smoking, and the definition of COPD.   Nutrition I: Fats -Discuss the types of cholesterol, what cholesterol does to the heart, and how cholesterol levels can be controlled.   Nutrition II: Labels -Discuss the different components of food labels and how to read food label   Heart Parts/Heart Disease and PAD -Discuss the anatomy of the heart, the pathway of blood circulation through the heart, and these are affected by heart disease.   Stress I: Signs and Symptoms -Discuss the causes of stress, how stress may lead to anxiety and depression, and ways to limit  stress.   Stress II: Relaxation -Discuss different types of relaxation techniques to limit stress.   Warning Signs of Stroke / TIA -Discuss definition of a stroke, what the signs and symptoms are of a stroke, and how to identify when someone is having stroke.   Knowledge Questionnaire Score:  Knowledge Questionnaire Score - 07/20/20 1223      Knowledge Questionnaire Score   Pre Score 23/28           Core Components/Risk Factors/Patient Goals at Admission:  Personal Goals and Risk Factors at Admission - 07/20/20 1224      Core Components/Risk Factors/Patient Goals on Admission    Weight Management Weight Maintenance    Hypertension Yes    Intervention Provide education on lifestyle modifcations including regular physical activity/exercise, weight management, moderate sodium restriction and increased consumption of fresh fruit, vegetables, and low fat dairy, alcohol moderation, and smoking cessation.;Monitor prescription use compliance.    Expected Outcomes Short Term: Continued assessment and intervention until BP is < 140/39mm HG in hypertensive participants. < 130/32mm HG in hypertensive participants with diabetes, heart failure or chronic kidney disease.;Long Term: Maintenance of blood pressure at goal levels.    Lipids Yes    Intervention Provide education and support for participant on nutrition & aerobic/resistive exercise along with prescribed medications to achieve LDL 70mg , HDL >40mg .    Expected Outcomes Short Term: Participant states understanding of desired cholesterol values and is compliant with medications prescribed. Participant is following exercise prescription and nutrition guidelines.;Long Term: Cholesterol controlled with medications as prescribed, with individualized exercise RX and with personalized nutrition plan. Value goals: LDL < 70mg , HDL > 40 mg.           Core Components/Risk Factors/Patient Goals Review:   Goals and Risk Factor  Review    Row Name  07/26/20 1540 08/03/20 1046 08/27/20 1532         Core Components/Risk Factors/Patient Goals Review   Personal Goals Review Weight Management/Obesity;Hypertension;Lipids Weight Management/Obesity;Hypertension;Lipids;Tobacco Cessation Weight Management/Obesity;Hypertension;Lipids;Tobacco Cessation     Review Ms. Probus has multiple CAD risk factors. He is taking his medications as prescribed to manage his lipids and hypertension. He will meet with RD to discuss heart healthy, low sodium diet over the next few CR sessions. BP remains WNL at rest and during exertion. Expect to see progression towards CAD risk factor management goals over the next 30 days. Ms. Bores has multiple CAD risk factors. He is taking his medications as prescribed to manage his lipids and hypertension. He has met with RD to discuss heart healthy, low sodium diet. BP remains WNL at rest and during exertion. Continues to smoke 3-4 cigarettes daily. He has been provided counseling however is not ready to quit at this time. Will continue to assess for readiness to quit. Weight remains stable at 70.3, admission weight 69.7.  Expect to see progression towards CAD risk factor management goals over the next 30 days. Iram has been doing well with exercise. Leonel's vitals signs and weights have been stable. Quiton ontinues to smoke and is not interested in quitting at this time     Expected Outcomes Patient will continue to participate in CR for risk factor modification. Patient will continue to participate in CR for risk factor modification. Patient will continue to participate in CR for risk factor modification.            Core Components/Risk Factors/Patient Goals at Discharge (Final Review):   Goals and Risk Factor Review - 08/27/20 1532      Core Components/Risk Factors/Patient Goals Review   Personal Goals Review Weight Management/Obesity;Hypertension;Lipids;Tobacco Cessation    Review Alfie has been doing well with  exercise. Tyion's vitals signs and weights have been stable. Damine ontinues to smoke and is not interested in quitting at this time    Expected Outcomes Patient will continue to participate in CR for risk factor modification.           ITP Comments:  ITP Comments    Row Name 07/20/20 1014 07/26/20 1534 08/03/20 1035 08/27/20 1530     ITP Comments Dr Armanda Magic MD, Medical Director Mr. Studzinski completed his first cardiac rehab exercise session today and tolerated well. Worked to an RPE of 11 with 1.8 mets. Strength training and stretches completed. VSS. Denied complaints. Mr. Stengel goals are to increase his energy and continue to be active and enjoy family and friends. Mr. Rusco has completed 4 cardiac rehab exercise sessions since admission. He is tolerating exercise well. VSS and WNL. Works to an RPE of 11 however mets remain lower than walk test mets. He is averaging 1.9 exercising 30 min on the nustep; met 2.5 on 6 min walk test. He has been encouraged to increase his SPM by RN and EP. His goals are to increase his energy and continue to remain active with his family. Continues to need encouragement. Hope to see progression towards goals over the next 30 days. Will continue to reinforce need for working to an RPE of 11-13 inorder to increase stamina and strength. He continues to smoke 3-4 cigarettes daily. Ongoing counseling provided. 30 Day ITP Review. Gaylan has good attendance and participation in phase 2 cardiac rehab           Comments: See ITP comments.Gladstone Lighter, RN,BSN 08/31/2020  3:24 PM

## 2020-08-30 ENCOUNTER — Encounter (HOSPITAL_COMMUNITY)
Admission: RE | Admit: 2020-08-30 | Discharge: 2020-08-30 | Disposition: A | Discharge status: Home / Self Care | Payer: Medicare HMO | Source: Ambulatory Visit | Attending: Cardiology | Admitting: Cardiology

## 2020-08-30 ENCOUNTER — Other Ambulatory Visit: Payer: Self-pay

## 2020-08-30 DIAGNOSIS — Z8679 Personal history of other diseases of the circulatory system: Secondary | ICD-10-CM

## 2020-08-30 DIAGNOSIS — Z953 Presence of xenogenic heart valve: Secondary | ICD-10-CM

## 2020-08-30 DIAGNOSIS — Z952 Presence of prosthetic heart valve: Secondary | ICD-10-CM | POA: Diagnosis not present

## 2020-08-30 NOTE — Progress Notes (Addendum)
Nutrition Note  Reviewed label reading education. Pt has started incorporating more canned vegetables. He is looking for low sodium options. He plans to start reading the labels in his pantry to determine lower sodium convenience options. Pt had no further questions.   Will continue to monitor pt during cardiac rehab.  Michaele Offer, MS, RDN, LDN

## 2020-08-31 ENCOUNTER — Ambulatory Visit: Payer: Medicare HMO

## 2020-08-31 DIAGNOSIS — R0609 Other forms of dyspnea: Secondary | ICD-10-CM

## 2020-08-31 DIAGNOSIS — Z9889 Other specified postprocedural states: Secondary | ICD-10-CM

## 2020-08-31 DIAGNOSIS — Z953 Presence of xenogenic heart valve: Secondary | ICD-10-CM

## 2020-08-31 DIAGNOSIS — Z8679 Personal history of other diseases of the circulatory system: Secondary | ICD-10-CM

## 2020-08-31 DIAGNOSIS — R0600 Dyspnea, unspecified: Secondary | ICD-10-CM

## 2020-09-01 ENCOUNTER — Other Ambulatory Visit: Payer: Self-pay

## 2020-09-01 ENCOUNTER — Encounter (HOSPITAL_COMMUNITY)
Admission: RE | Admit: 2020-09-01 | Discharge: 2020-09-01 | Disposition: A | Discharge status: Home / Self Care | Payer: Medicare HMO | Source: Ambulatory Visit | Attending: Cardiology | Admitting: Cardiology

## 2020-09-01 DIAGNOSIS — Z952 Presence of prosthetic heart valve: Secondary | ICD-10-CM | POA: Diagnosis not present

## 2020-09-01 DIAGNOSIS — Z9889 Other specified postprocedural states: Secondary | ICD-10-CM

## 2020-09-01 DIAGNOSIS — Z8679 Personal history of other diseases of the circulatory system: Secondary | ICD-10-CM

## 2020-09-01 DIAGNOSIS — Z953 Presence of xenogenic heart valve: Secondary | ICD-10-CM

## 2020-09-03 ENCOUNTER — Other Ambulatory Visit: Payer: Self-pay

## 2020-09-03 ENCOUNTER — Encounter (HOSPITAL_COMMUNITY)
Admission: RE | Admit: 2020-09-03 | Discharge: 2020-09-03 | Disposition: A | Discharge status: Home / Self Care | Payer: Medicare HMO | Source: Ambulatory Visit | Attending: Cardiology | Admitting: Cardiology

## 2020-09-03 DIAGNOSIS — Z953 Presence of xenogenic heart valve: Secondary | ICD-10-CM

## 2020-09-03 DIAGNOSIS — Z952 Presence of prosthetic heart valve: Secondary | ICD-10-CM | POA: Diagnosis not present

## 2020-09-06 ENCOUNTER — Encounter (HOSPITAL_COMMUNITY): Payer: Medicare HMO

## 2020-09-07 NOTE — Progress Notes (Signed)
Reviewed home exercise Rx today with patient. Pt is not currently doing any exercise at home. Discussed with patient adding 2-3 days/week of walking at home to supplement his exercise here in the CRP2 program. Encouraged warm-up, cool-down and stretching. Reviewed THRR of 59-118 and keeping exercise between 11-13 on the RPE scale. Fluids were encouraged before, during and after exercise. We discussed safety parameters for temperature and humidity for exercising outdoors. Reviewed S/S that would require patient to terminate exercise and when to call 911 vs MD. Pt verbalized understanding of the home exercise Rx and was provided a copy.   Lesly Rubenstein MS, ACSM-EP-C, CCRP

## 2020-09-08 ENCOUNTER — Encounter (HOSPITAL_COMMUNITY)
Admission: RE | Admit: 2020-09-08 | Discharge: 2020-09-08 | Disposition: A | Discharge status: Home / Self Care | Payer: Medicare HMO | Source: Ambulatory Visit | Attending: Cardiology | Admitting: Cardiology

## 2020-09-08 ENCOUNTER — Other Ambulatory Visit: Payer: Self-pay

## 2020-09-08 VITALS — Ht 66.25 in | Wt 157.0 lb

## 2020-09-08 DIAGNOSIS — Z952 Presence of prosthetic heart valve: Secondary | ICD-10-CM | POA: Diagnosis not present

## 2020-09-08 DIAGNOSIS — Z9889 Other specified postprocedural states: Secondary | ICD-10-CM

## 2020-09-08 DIAGNOSIS — Z8679 Personal history of other diseases of the circulatory system: Secondary | ICD-10-CM

## 2020-09-08 DIAGNOSIS — Z953 Presence of xenogenic heart valve: Secondary | ICD-10-CM

## 2020-09-10 ENCOUNTER — Encounter (HOSPITAL_COMMUNITY)
Admission: RE | Admit: 2020-09-10 | Discharge: 2020-09-10 | Disposition: A | Discharge status: Home / Self Care | Payer: Medicare HMO | Source: Ambulatory Visit | Attending: Cardiology | Admitting: Cardiology

## 2020-09-10 ENCOUNTER — Other Ambulatory Visit: Payer: Self-pay

## 2020-09-10 DIAGNOSIS — Z8679 Personal history of other diseases of the circulatory system: Secondary | ICD-10-CM

## 2020-09-10 DIAGNOSIS — Z953 Presence of xenogenic heart valve: Secondary | ICD-10-CM

## 2020-09-10 DIAGNOSIS — Z952 Presence of prosthetic heart valve: Secondary | ICD-10-CM | POA: Diagnosis not present

## 2020-09-13 ENCOUNTER — Other Ambulatory Visit: Payer: Self-pay

## 2020-09-13 ENCOUNTER — Encounter (HOSPITAL_COMMUNITY)
Admission: RE | Admit: 2020-09-13 | Discharge: 2020-09-13 | Disposition: A | Discharge status: Home / Self Care | Payer: Medicare HMO | Source: Ambulatory Visit | Attending: Cardiology | Admitting: Cardiology

## 2020-09-13 DIAGNOSIS — Z952 Presence of prosthetic heart valve: Secondary | ICD-10-CM | POA: Diagnosis not present

## 2020-09-13 DIAGNOSIS — Z8679 Personal history of other diseases of the circulatory system: Secondary | ICD-10-CM

## 2020-09-13 DIAGNOSIS — Z953 Presence of xenogenic heart valve: Secondary | ICD-10-CM

## 2020-09-15 ENCOUNTER — Other Ambulatory Visit: Payer: Self-pay

## 2020-09-15 ENCOUNTER — Encounter (HOSPITAL_COMMUNITY)
Admission: RE | Admit: 2020-09-15 | Discharge: 2020-09-15 | Disposition: A | Discharge status: Home / Self Care | Payer: Medicare HMO | Source: Ambulatory Visit | Attending: Cardiology | Admitting: Cardiology

## 2020-09-15 DIAGNOSIS — Z9889 Other specified postprocedural states: Secondary | ICD-10-CM

## 2020-09-15 DIAGNOSIS — Z8679 Personal history of other diseases of the circulatory system: Secondary | ICD-10-CM

## 2020-09-15 DIAGNOSIS — Z952 Presence of prosthetic heart valve: Secondary | ICD-10-CM | POA: Diagnosis not present

## 2020-09-15 DIAGNOSIS — Z953 Presence of xenogenic heart valve: Secondary | ICD-10-CM

## 2020-09-15 NOTE — Progress Notes (Signed)
Discharge Progress Report  Patient Details  Name: Kurt Allen MRN: [id number] Date of Birth: 02-15-1946 Referring Provider:   Flowsheet Row CARDIAC REHAB PHASE II ORIENTATION from 07/20/2020 in [place]  Referring Provider Adrian Prows, MD       Number of Visits: 16 of 24  Reason for Discharge:  Patient reached a stable level of exercise. Patient independent in their exercise. Patient has met program and personal goals.  Smoking History:  Social History   Tobacco Use  Smoking Status Current Some Day Smoker  . Packs/day: 0.25  . Years: 50.00  . Pack years: 12.50  . Types: Cigarettes  Smokeless Tobacco Never Used  Tobacco Comment   2 a day    Diagnosis:  No diagnosis found.  ADL UCSD:   Initial Exercise Prescription:  Initial Exercise Prescription - 07/20/20 1300      Date of Initial Exercise RX and Referring Provider   Date 07/20/20    Referring Provider Adrian Prows, MD    Expected Discharge Date 09/17/20      NuStep   Level 2    SPM 85    Minutes 30    METs 2      Prescription Details   Frequency (times per week) 3    Duration Progress to 30 minutes of continuous aerobic without signs/symptoms of physical distress      Intensity   THRR 40-80% of Max Heartrate 59-118    Ratings of Perceived Exertion 11-13    Perceived Dyspnea 0-4      Progression   Progression Continue progressive overload as per policy without signs/symptoms or physical distress.      Resistance Training   Training Prescription Yes    Weight 3 lbs    Reps 10-15           Discharge Exercise Prescription (Final Exercise Prescription Changes):  Exercise Prescription Changes - 09/01/20 1400      Response to Exercise   Blood Pressure (Admit) 130/66    Blood Pressure (Exercise) 138/76    Blood Pressure (Exit) 112/56    Heart Rate (Admit) 64 bpm    Heart Rate (Exercise) 72 bpm    Heart Rate (Exit) 65 bpm    Rating of Perceived Exertion  (Exercise) 11    Symptoms None    Comments Reviewed home exercise Rx    Duration Continue with 30 min of aerobic exercise without signs/symptoms of physical distress.    Intensity THRR unchanged      Progression   Progression Continue to progress workloads to maintain intensity without signs/symptoms of physical distress.    Average METs 2.5      Resistance Training   Training Prescription No    Weight No weights done on Wednesday      Interval Training   Interval Training No      NuStep   Level 4    SPM 85    Minutes 30    METs 2.5      Home Exercise Plan   Plans to continue exercise at Home (comment)    Frequency Add 2 additional days to program exercise sessions.    Initial Home Exercises Provided 09/01/20           Functional Capacity:  6 Minute Walk    Row Name 07/20/20 1050 09/08/20 1315       6 Minute Walk   Phase Initial Discharge    Distance 1180 feet 1400 feet  Distance % Change -- 18.64 %    Distance Feet Change -- 220 ft    Walk Time 6 minutes 6 minutes    # of Rest Breaks 0 0    MPH 2.2 2.7    METS 2.5 3.1    RPE 11 11    Perceived Dyspnea  0 0    VO2 Peak 8.91 10.9    Symptoms No No    Resting HR 62 bpm 83 bpm    Resting BP 118/74 136/82    Resting Oxygen Saturation  100 % 100 %    Exercise Oxygen Saturation  during 6 min walk 99 % 99 %    Max Ex. HR 90 bpm 102 bpm    Max Ex. BP 126/76 138/80    2 Minute Post BP 120/70 --           Psychological, QOL, Others - Outcomes: PHQ 2/9: Depression screen [facility] 2/9 07/20/2020 07/20/2020  Decreased Interest 0 0  Down, Depressed, Hopeless 0 0  PHQ - 2 Score 0 0    Quality of Life:  Quality of Life - 09/13/20 1330      Quality of Life Scores   Health/Function Post 24.86 %    Socioeconomic Post 25.71 %    Psych/Spiritual Post 29.29 %    Family Post 30 %    GLOBAL Post 26.76 %           Personal Goals: Goals established at orientation with interventions provided to work toward  goal.  Personal Goals and Risk Factors at Admission - 07/20/20 1224      Core Components/Risk Factors/Patient Goals on Admission    Weight Management Weight Maintenance    Hypertension Yes    Intervention Provide education on lifestyle modifcations including regular physical activity/exercise, weight management, moderate sodium restriction and increased consumption of fresh fruit, vegetables, and low fat dairy, alcohol moderation, and smoking cessation.;Monitor prescription use compliance.    Expected Outcomes Short Term: Continued assessment and intervention until BP is < 140/59m HG in hypertensive participants. < 130/864mHG in hypertensive participants with diabetes, heart failure or chronic kidney disease.;Long Term: Maintenance of blood pressure at goal levels.    Lipids Yes    Intervention Provide education and support for participant on nutrition & aerobic/resistive exercise along with prescribed medications to achieve LDL <7028mHDL >76m77m  Expected Outcomes Short Term: Participant states understanding of desired cholesterol values and is compliant with medications prescribed. Participant is following exercise prescription and nutrition guidelines.;Long Term: Cholesterol controlled with medications as prescribed, with individualized exercise RX and with personalized nutrition plan. Value goals: LDL < 70mg36mL > 40 mg.            Personal Goals Discharge:  Goals and Risk Factor Review    Row Name 07/26/20 1540 08/03/20 1046 08/27/20 1532         Core Components/Risk Factors/Patient Goals Review   Personal Goals Review Weight Management/Obesity;Hypertension;Lipids Weight Management/Obesity;Hypertension;Lipids;Tobacco Cessation Weight Management/Obesity;Hypertension;Lipids;Tobacco Cessation     Review Ms. Mcmasters has multiple CAD risk factors. He is taking his medications as prescribed to manage his lipids and hypertension. He will meet with RD to discuss heart healthy, low sodium  diet over the next few CR sessions. BP remains WNL at rest and during exertion. Expect to see progression towards CAD risk factor management goals over the next 30 days. Ms. PaschHaltermanmultiple CAD risk factors. He is taking his medications as prescribed to manage his lipids  and hypertension. He has met with RD to discuss heart healthy, low sodium diet. BP remains WNL at rest and during exertion. Continues to smoke 3-4 cigarettes daily. He has been provided counseling however is not ready to quit at this time. Will continue to assess for readiness to quit. Weight remains stable at 70.3, admission weight 69.7.  Expect to see progression towards CAD risk factor management goals over the next 30 days. Epimenio has been doing well with exercise. Ignacio's vitals signs and weights have been stable. Obert ontinues to smoke and is not interested in quitting at this time     Expected Outcomes Patient will continue to participate in CR for risk factor modification. Patient will continue to participate in CR for risk factor modification. Patient will continue to participate in CR for risk factor modification.            Exercise Goals and Review:  Exercise Goals    Row Name 07/20/20 1311             Exercise Goals   Increase Physical Activity Yes       Intervention Provide advice, education, support and counseling about physical activity/exercise needs.;Develop an individualized exercise prescription for aerobic and resistive training based on initial evaluation findings, risk stratification, comorbidities and participant's personal goals.       Expected Outcomes Short Term: Attend rehab on a regular basis to increase amount of physical activity.;Long Term: Add in home exercise to make exercise part of routine and to increase amount of physical activity.;Long Term: Exercising regularly at least 3-5 days a week.       Increase Strength and Stamina Yes       Intervention Provide advice, education, support and  counseling about physical activity/exercise needs.;Develop an individualized exercise prescription for aerobic and resistive training based on initial evaluation findings, risk stratification, comorbidities and participant's personal goals.       Expected Outcomes Short Term: Increase workloads from initial exercise prescription for resistance, speed, and METs.;Short Term: Perform resistance training exercises routinely during rehab and add in resistance training at home;Long Term: Improve cardiorespiratory fitness, muscular endurance and strength as measured by increased METs and functional capacity (6MWT)       Able to understand and use rate of perceived exertion (RPE) scale Yes       Intervention Provide education and explanation on how to use RPE scale       Expected Outcomes Short Term: Able to use RPE daily in rehab to express subjective intensity level;Long Term:  Able to use RPE to guide intensity level when exercising independently       Knowledge and understanding of Target Heart Rate Range (THRR) Yes       Intervention Provide education and explanation of THRR including how the numbers were predicted and where they are located for reference       Expected Outcomes Short Term: Able to state/look up THRR;Long Term: Able to use THRR to govern intensity when exercising independently;Short Term: Able to use daily as guideline for intensity in rehab       Able to check pulse independently Yes       Intervention Review the importance of being able to check your own pulse for safety during independent exercise;Provide education and demonstration on how to check pulse in carotid and radial arteries.       Expected Outcomes Short Term: Able to explain why pulse checking is important during independent exercise;Long Term: Able to check pulse independently  and accurately       Understanding of Exercise Prescription Yes       Intervention Provide education, explanation, and written materials on patient's  individual exercise prescription       Expected Outcomes Short Term: Able to explain program exercise prescription;Long Term: Able to explain home exercise prescription to exercise independently              Exercise Goals Re-Evaluation:  Exercise Goals Re-Evaluation    Row Name 07/26/20 1454 08/25/20 1440           Exercise Goal Re-Evaluation   Exercise Goals Review Increase Physical Activity;Increase Strength and Stamina;Able to understand and use rate of perceived exertion (RPE) scale;Knowledge and understanding of Target Heart Rate Range (THRR);Understanding of Exercise Prescription Increase Physical Activity;Increase Strength and Stamina;Able to understand and use rate of perceived exertion (RPE) scale;Knowledge and understanding of Target Heart Rate Range (THRR);Understanding of Exercise Prescription      Comments Pt's first day of exercise in the CRP2 program. Pt tolerated the session well and understands the RPE scale, THRR, and exercise Rx. Reviewed METs and Goals. Pt has not been here for several weeks due to COVID-19 exposure. Pt is eager to contiune his CRP2 program.      Expected Outcomes Will continue to monitor and progress pt workloads as tolerated. Will continue to monitor and progress pt workloads as tolerated.             Nutrition & Weight - Outcomes:  Pre Biometrics - 07/20/20 1220      Pre Biometrics   Waist Circumference 36 inches    Hip Circumference 38 inches    Waist to Hip Ratio 0.95 %    Triceps Skinfold 9 mm    % Body Fat 22.8 %    Grip Strength 38 kg    Flexibility 10 in    Single Leg Stand 30 seconds           Post Biometrics - 09/08/20 1345       Post  Biometrics   Height 5' 6.25" (1.683 m)    Weight 71.2 kg    Waist Circumference 36 inches    Hip Circumference 37.75 inches    Waist to Hip Ratio 0.95 %    BMI (Calculated) 25.14    Triceps Skinfold 10 mm    % Body Fat 23.4 %    Grip Strength 42 kg    Flexibility 10.25 in    Single Leg  Stand 30 seconds           Nutrition:  Nutrition Therapy & Goals - 07/30/20 1517      Nutrition Therapy   Diet TLC      Personal Nutrition Goals   Nutrition Goal Pt to build a healthy plate including vegetables, fruits, whole grains, and low-fat dairy products in a heart healthy meal plan.    Personal Goal #2 Buy frozen or canned fruits and veggies    Personal Goal #3 Choose Low sodium or in natural juices when choosing canned    Personal Goal #4 Choose olive oil and nuts daily      Intervention Plan   Intervention Prescribe, educate and counsel regarding individualized specific dietary modifications aiming towards targeted core components such as weight, hypertension, lipid management, diabetes, heart failure and other comorbidities.;Nutrition handout(s) given to patient.    Expected Outcomes Short Term Goal: Understand basic principles of dietary content, such as calories, fat, sodium, cholesterol and nutrients.  Nutrition Discharge:   Education Questionnaire Score:  Knowledge Questionnaire Score - 09/13/20 1330      Knowledge Questionnaire Score   Post Score 26/28           Mr. Dimarzo graduated from cardiac rehab 09/17/20 after completing 16 of 24 exercise sessions. Giovany continues to smoke but is receptive to ongoing counseling provided in CR and willing to discuss welbutrin with his PCP. He has reduced his smoking to 3-4 cigarettes daily. His goal was to have more energy by program completion. Mr. Coate states he can do more things around his home without feeling tired. He is walking with his dog as weather permits. He improved his stamina and strength as evidenced by his ability to walk an additional 250 feet in 6 min as compared to his entry 6 min walk test. He plans to utilize his silver sneaker benefits post discharge. He continue to take his medications as prescribed and follows a reduced saturated fat diet.Goals reviewed with patient; copy given to  patient.

## 2020-09-17 ENCOUNTER — Encounter (HOSPITAL_COMMUNITY)
Admission: RE | Admit: 2020-09-17 | Discharge: 2020-09-17 | Disposition: A | Discharge status: Home / Self Care | Payer: Medicare HMO | Source: Ambulatory Visit | Attending: Cardiology | Admitting: Cardiology

## 2020-09-17 ENCOUNTER — Other Ambulatory Visit: Payer: Self-pay

## 2020-09-17 DIAGNOSIS — Z952 Presence of prosthetic heart valve: Secondary | ICD-10-CM | POA: Diagnosis not present

## 2020-09-17 DIAGNOSIS — Z8679 Personal history of other diseases of the circulatory system: Secondary | ICD-10-CM

## 2020-09-17 DIAGNOSIS — Z953 Presence of xenogenic heart valve: Secondary | ICD-10-CM

## 2020-11-11 ENCOUNTER — Other Ambulatory Visit: Payer: Self-pay

## 2020-11-11 ENCOUNTER — Encounter: Payer: Self-pay | Admitting: Adult Health

## 2020-11-11 ENCOUNTER — Ambulatory Visit: Payer: Medicare HMO | Admitting: Adult Health

## 2020-11-11 DIAGNOSIS — Z720 Tobacco use: Secondary | ICD-10-CM | POA: Diagnosis not present

## 2020-11-11 DIAGNOSIS — J432 Centrilobular emphysema: Secondary | ICD-10-CM

## 2020-11-11 DIAGNOSIS — Z9189 Other specified personal risk factors, not elsewhere classified: Secondary | ICD-10-CM

## 2020-11-11 DIAGNOSIS — R911 Solitary pulmonary nodule: Secondary | ICD-10-CM | POA: Diagnosis not present

## 2020-11-11 MED ORDER — DOXYCYCLINE HYCLATE 100 MG PO TABS: 100.0000 mg | ORAL_TABLET | Freq: Two times a day (BID) | ORAL | 0 refills | Status: DC

## 2020-11-11 MED ORDER — ALBUTEROL SULFATE HFA 108 (90 BASE) MCG/ACT IN AERS: 1.0000 | INHALATION_SPRAY | RESPIRATORY_TRACT | 5 refills | Status: DC | PRN

## 2020-11-11 NOTE — Assessment & Plan Note (Signed)
Mild COPD flare with URI   Plan  Patient Instructions  Doxycycline 100mg  Twice daily  For 7 days  Mucinex DM Twice daily  For cough/congestion  Albuterol inhaler As needed   Activity as tolerated.  Work on not smoking  Follow up for CT planned in August 2022.  Follow up Dr. Elsworth Soho  In 6 months and As needed   Please contact office for sooner follow up if symptoms do not improve or worsen or seek emergency care

## 2020-11-11 NOTE — Addendum Note (Signed)
Addended by: Vanessa Barbara on: 11/11/2020 04:07 PM   Modules accepted: Orders

## 2020-11-11 NOTE — Assessment & Plan Note (Signed)
Smoking cessation  

## 2020-11-11 NOTE — Progress Notes (Signed)
@Patient  ID: Kurt Allen, male    DOB: [birth year], 75 y.o.   MRN: [id number]  Chief Complaint  Patient presents with  . Follow-up    Referring provider: Timoteo Gaul, FNP  HPI: 75 year old male smoker followed for Emphysema and Lung nodule  Medical history significant for severe aortic valve stenosis and AAA , s/p AVR 03/2020 .   TEST/EVENTS :  PFTs 03/2020-moderate airway obstruction, ratio 54, FEV1 75%, FVC 90%, TLC 1 1 8% and DLCO 62% LDCT 03/2020 >> no nodules noted LDCT 12/2019 >>8.2 mm irregular opacity anterior right lung apex,5.1 cm diameter ascending thoracic aorta with dense calcification of the aortic valve  Echo 12/2019-severe AS, peak gradient 44 mm LHC6/2021 normal coronaries   11/11/2020 Follow up : Emphysema and Lung nodule  Patient presents for a 71-month follow-up.  Patient has underlying emphysema.  Is an active smoker. Complains of 1.5 weeks of cough, congestion, and thick mucus .  Has been using otc cold meds without much relief.  Otherwise doing well. Tries to stay active. Still smoking but has cut back.  No chest pain, orthopnea or edema.    Has AVR in 03/2020 . Says he did well. Finished cardiac rehab.   No Known Allergies  Immunization History  Administered Date(s) Administered  . Fluad Quad(high Dose 65+) 05/13/2020  . Influenza-Unspecified 07/08/2019  . PFIZER(Purple Top)SARS-COV-2 Vaccination 10/14/2019, 11/04/2019, 08/05/2020    Past Medical History:  Diagnosis Date  . Centrilobular emphysema ([place])   . Hyperlipidemia   . Hypertension   . S/P ascending aortic aneurysm repair 04/14/2020  . 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 Severe aortic stenosis   . Thoracic ascending aortic aneurysm ([place])   . Tobacco abuse     Tobacco  History: Social History   Tobacco Use  Smoking Status Current Some Day Smoker  . Packs/day: 0.25  . Years: 50.00  . Pack years: 12.50  . Types: Cigarettes  Smokeless Tobacco Never Used  Tobacco Comment   smokes 2-3 day late at night   Ready to quit: Yes Counseling given: Yes Comment: smokes 2-3 day late at night   Outpatient Medications Prior to Visit  Medication Sig Dispense Refill  . acetaminophen (TYLENOL) 325 MG tablet Take 2 tablets (650 mg total) by mouth every 6 (six) hours as needed for mild pain.    Marland Kitchen albuterol (VENTOLIN HFA) 108 (90 Base) MCG/ACT inhaler Inhale 1 puff into the lungs every 4 (four) hours as needed for wheezing or shortness of breath.     Marland Kitchen aspirin EC 81 MG EC tablet Take 1 tablet (81 mg total) by mouth daily. Swallow whole.    . Cholecalciferol (VITAMIN D3) 1.25 MG (50000 UT) CAPS Take 50,000 Units by mouth once a week.     . fluticasone (FLONASE) 50 MCG/ACT nasal spray Place 1 spray into both nostrils daily as needed for rhinitis.    . magnesium oxide (MAG-OX) 400 (241.3 Mg) MG tablet Take 1 tablet (400 mg total) by mouth 2 (two) times daily. 30 tablet 0  . metoprolol tartrate (LOPRESSOR) 25 MG tablet Take 0.5 tablets (12.5 mg total) by mouth 2 (two) times daily. 90 tablet 3  . nicotine (NICODERM CQ - DOSED IN MG/24 HOURS) 21 mg/24hr patch Place 21 mg onto the skin daily.    . rosuvastatin (CRESTOR) 5  MG tablet Take 1 tablet (5 mg total) by mouth daily. 90 tablet 3  . tamsulosin (FLOMAX) 0.4 MG CAPS capsule Take 0.4 mg by mouth daily.      No facility-administered medications prior to visit.     Review of Systems:   Constitutional:   No  weight loss, night sweats,  Fevers, chills,  +fatigue, or  lassitude.  HEENT:   No headaches,  Difficulty swallowing,  Tooth/dental problems, or  Sore throat,                No sneezing, itching, ear ache,  +nasal congestion, post nasal drip,   CV:  No chest pain,  Orthopnea, PND, swelling in lower extremities,  anasarca, dizziness, palpitations, syncope.   GI  No heartburn, indigestion, abdominal pain, nausea, vomiting, diarrhea, change in bowel habits, loss of appetite, bloody stools.   Resp:  No chest wall deformity  Skin: no rash or lesions.  GU: no dysuria, change in color of urine, no urgency or frequency.  No flank pain, no hematuria   MS:  No joint pain or swelling.  No decreased range of motion.  No back pain.    Physical Exam  BP 130/70 (BP Location: Left Arm, Patient Position: Sitting, Cuff Size: Normal)   Pulse 64   Temp 98.4 F (36.9 C) (Temporal)   Ht 5\' 8"  (1.727 m)   Wt 155 lb 12.8 oz (70.7 kg)   SpO2 96%   BMI 23.69 kg/m   GEN: A/Ox3; pleasant , NAD, well nourished    HEENT:  [place]/AT,   NOSE-clear, THROAT-clear, no lesions, no postnasal drip or exudate noted.   NECK:  Supple w/ fair ROM; no JVD; normal carotid impulses w/o bruits; no thyromegaly or nodules palpated; no lymphadenopathy.    RESP  Clear  P & A; w/o, wheezes/ rales/ or rhonchi. no accessory muscle use, no dullness to percussion  CARD:  RRR, no m/r/g, no peripheral edema, pulses intact, no cyanosis or clubbing.  GI:   Soft & nt; nml bowel sounds; no organomegaly or masses detected.   Musco: Warm bil, no deformities or joint swelling noted.   Neuro: alert, no focal deficits noted.    Skin: Warm, no lesions or rashes    Lab Results:   BNP No results found for: BNP  ProBNP No results found for: PROBNP  Imaging: No results found.    PFT Results Latest Ref Rng & Units 04/13/2020  FVC-Pre L 3.98  FVC-Predicted Pre % 100  FVC-Post L 4.00  FVC-Predicted Post % 100  Pre FEV1/FVC % % 54  Post FEV1/FCV % % 53  FEV1-Pre L 2.17  FEV1-Predicted Pre % 75  FEV1-Post L 2.14  DLCO uncorrected ml/min/mmHg 15.01  DLCO UNC% % 62  DLCO corrected ml/min/mmHg 14.97  DLCO COR %Predicted % 62  DLVA Predicted % 64  TLC L 7.85  TLC % Predicted % 118  RV % Predicted % 144    No results found for:  NITRICOXIDE      Assessment & Plan:   Pulmonary nodule less than 1 cm in diameter with moderate to high risk for malignant neoplasm CT scan 03/2020 with stable small nodules /scarring .  LDCT scan planned 03/2021   Centrilobular emphysema ([place]) Mild COPD flare with URI   Plan  Patient Instructions  Doxycycline 100mg  Twice daily  For 7 days  Mucinex DM Twice daily  For cough/congestion  Albuterol inhaler As needed   Activity as tolerated.  Work on  not smoking  Follow up for CT planned in August 2022.  Follow up Dr. Elsworth Soho  In 6 months and As needed   Please contact office for sooner follow up if symptoms do not improve or worsen or seek emergency care       Tobacco abuse Smoking cessation      Rexene Edison, NP 11/11/2020

## 2020-11-11 NOTE — Assessment & Plan Note (Addendum)
CT scan 03/2020 with stable small nodules /scarring .  LDCT scan planned 03/2021

## 2020-11-11 NOTE — Patient Instructions (Signed)
Doxycycline 100mg  Twice daily  For 7 days  Mucinex DM Twice daily  For cough/congestion  Albuterol inhaler As needed   Activity as tolerated.  Work on not smoking  Follow up for CT planned in August 2022.  Follow up Dr. Elsworth Soho  In 6 months and As needed   Please contact office for sooner follow up if symptoms do not improve or worsen or seek emergency care

## 2020-12-09 ENCOUNTER — Ambulatory Visit: Payer: Medicare HMO

## 2020-12-09 ENCOUNTER — Other Ambulatory Visit: Payer: Self-pay

## 2020-12-09 DIAGNOSIS — R0989 Other specified symptoms and signs involving the circulatory and respiratory systems: Secondary | ICD-10-CM

## 2020-12-12 NOTE — Progress Notes (Signed)
Very minimal plaque in the bilateral carotid arteries.  No further studies needed.

## 2020-12-13 ENCOUNTER — Other Ambulatory Visit: Payer: Medicare HMO

## 2020-12-21 ENCOUNTER — Encounter: Payer: Self-pay | Admitting: Thoracic Surgery (Cardiothoracic Vascular Surgery)

## 2021-01-26 ENCOUNTER — Ambulatory Visit: Payer: Medicare HMO | Admitting: Cardiology

## 2021-01-28 ENCOUNTER — Ambulatory Visit: Payer: Medicare HMO | Admitting: Cardiology

## 2021-01-28 ENCOUNTER — Encounter: Payer: Self-pay | Admitting: Cardiology

## 2021-01-28 ENCOUNTER — Other Ambulatory Visit: Payer: Self-pay

## 2021-01-28 VITALS — BP 130/78 | HR 47 | Temp 98.5°F | Resp 16 | Ht 68.0 in | Wt 153.2 lb

## 2021-01-28 DIAGNOSIS — R001 Bradycardia, unspecified: Secondary | ICD-10-CM

## 2021-01-28 DIAGNOSIS — Z953 Presence of xenogenic heart valve: Secondary | ICD-10-CM

## 2021-01-28 DIAGNOSIS — I359 Nonrheumatic aortic valve disorder, unspecified: Secondary | ICD-10-CM

## 2021-01-28 DIAGNOSIS — I10 Essential (primary) hypertension: Secondary | ICD-10-CM

## 2021-01-28 DIAGNOSIS — Z8679 Personal history of other diseases of the circulatory system: Secondary | ICD-10-CM

## 2021-01-28 MED ORDER — LOSARTAN POTASSIUM 25 MG PO TABS: 25.0000 mg | ORAL_TABLET | Freq: Every day | ORAL | 3 refills | Status: DC

## 2021-01-28 NOTE — Progress Notes (Signed)
[ Primary Physician/Referring:  Timoteo Gaul, FNP  Patient ID: Kurt Allen, male    DOB: 1946-04-03, 75 y.o.   MRN: [id number]  Chief Complaint  Patient presents with   AV replacement   aortic root repair   HPI:    Kurt Allen  is a 75 y.o. Caucasian male with hypertension, tobacco use disorder, severe aortic valve stenosis confirmed by cardiac catheterization on 02/17/2020 without significant coronary artery disease, a 5.1 cm ascending aortic aneurysm noted during low-dose CT scan of the chest for lung cancer screening, abnormal lung mass and needs further follow-up.    Patient underwent elective bioprosthetic aortic valve replacement along with aortic root repair on 04/14/2020. He has 100+ year history of tobacco use, started smoking when he is 74 years of age and has been smoking about 2 packs cigarettes a day.  He is presently using nicotine patches that are prescribed and is smoking about 1 cigarette every third day and is trying his best to quit. Except for chronic dyspnea denies palpitations, pain, claudication.   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/2020   25 mm Edwards Konect Resilia stented bovine pericardial valve synthetic root conduit with reimplantation of left main and right coronary arteries   Severe aortic stenosis    Thoracic ascending aortic aneurysm ([facility])    Tobacco abuse    Past Surgical History:  Procedure Laterality Date   AORTIC VALVE REPLACEMENT N/A 04/14/2020   Procedure: AORTIC VALVE REPLACEMENT (AVR);  Surgeon: Rexene Alberts, MD;  Location: [facility];  Service: Open Heart Surgery;  Laterality: N/A;   BENTALL PROCEDURE N/A 04/14/2020   Procedure: BENTALL AORTIC ROOT REPLACEMENT USING KONECT RESILIA 25MM AORTIC VALVED CONDUIT WITH REIMPLANTATION OF  RIGHT AND LEFT CORONARY BUTTONS;  Surgeon: Rexene Alberts, MD;  Location: [facility];  Service: Open Heart Surgery;  Laterality: N/A;   CARDIAC CATHETERIZATION     EYE SURGERY Bilateral    cataracts   RIGHT HEART CATH AND CORONARY ANGIOGRAPHY N/A 02/17/2020   Procedure: RIGHT HEART CATH AND CORONARY ANGIOGRAPHY;  Surgeon: Adrian Prows, MD;  Location: [facility];  Service: Cardiovascular;  Laterality: N/A;   TEE WITHOUT CARDIOVERSION N/A 04/14/2020   Procedure: TRANSESOPHAGEAL ECHOCARDIOGRAM (TEE);  Surgeon: Rexene Alberts, MD;  Location: [facility];  Service: Open Heart Surgery;  Laterality: N/A;   THORACIC AORTIC ANEURYSM REPAIR N/A 04/14/2020   Procedure: THORACIC ASCENDING ANEURYSM REPAIR (AAA);  Surgeon: Rexene Alberts, MD;  Location: [facility];  Service: Open Heart Surgery;  Laterality: N/A;   History reviewed. No pertinent family history.  Social History   Tobacco Use   Smoking status: Some Days    Packs/day: 0.25    Years: 50.00    Pack years: 12.50    Types: Cigarettes   Smokeless tobacco: Never   Tobacco comments:    smokes 2-3 day late at night  Substance Use Topics   Alcohol use: Yes    Alcohol/week: 10.0 standard drinks    Types: 10 Cans of beer per week    Comment: per week   Marital Status: Divorced  ROS  Review of Systems  Cardiovascular:  Positive for dyspnea on exertion. Negative for chest pain and leg swelling.  Gastrointestinal:  Negative for melena.  Objective  Blood pressure 130/78, pulse Marland Kitchen)  47, temperature 98.5 F (36.9 C), temperature source Temporal, resp. rate 16, height _0  (1.727 m), weight 153 lb 3.2 oz (69.5 kg), SpO2 99 %.  Vitals with BMI 01/28/2021 01/28/2021 11/11/2020  Height - _1  _2   Weight - 153 lbs 3 oz 155 lbs 13 oz  BMI - 92.3 30.07  Systolic 622 633 354  Diastolic 78 72 70  Pulse 47 46 64     Physical Exam Constitutional:      Appearance: He is well-developed.  Cardiovascular:     Rate and Rhythm: Bradycardia present.      Pulses:          Carotid pulses are  on the left side with bruit.      Femoral pulses are 2+ on the right side.      Popliteal pulses are 2+ on the right side and 2+ on the left side.       Dorsalis pedis pulses are 1+ on the right side and 2+ on the left side.       Posterior tibial pulses are 2+ on the right side and 2+ on the left side.     Heart sounds: Murmur heard.  Early systolic murmur is present with a grade of 2/6 at the upper right sternal border.    No gallop.     Comments: No pedal edema. No JVD Pulmonary:     Effort: Pulmonary effort is normal.     Breath sounds: Decreased breath sounds (barrel shaped chest with emphysema) present.  Abdominal:     General: Bowel sounds are normal.     Palpations: Abdomen is soft.   Laboratory examination:   Recent Labs    04/18/20 0447 04/19/20 0158 04/20/20 0140  NA 133* 128* 132*  K 3.3* 3.9 3.7  CL 101 90* 92*  CO2 _3 GLUCOSE 97 109* 113*  BUN _4 CREATININE 0.71 0.93 1.03  CALCIUM 6.9* 8.4* 8.5*  GFRNONAA >60 >60 >60  GFRAA >60 >60 >60   CrCl cannot be calculated (Patient's most recent lab result is older than the maximum 21 days allowed.).  CMP Latest Ref Rng & Units 04/20/2020 04/19/2020 04/18/2020  Glucose 70 - 99 mg/dL 113(H) 109(H) 97  BUN 8 - 23 mg/dL _5 Creatinine 0.61 - 1.24 mg/dL 1.03 0.93 0.71  Sodium 135 - 145 mmol/L 132(L) 128(L) 133(L)  Potassium 3.5 - 5.1 mmol/L 3.7 3.9 3.3(L)  Chloride 98 - 111 mmol/L 92(L) 90(L) 101  CO2 22 - 32 mmol/L _6 Calcium 8.9 - 10.3 mg/dL 8.5(L) 8.4(L) 6.9(L)  Total Protein 6.5 - 8.1 g/dL - - -  Total Bilirubin 0.3 - 1.2 mg/dL - - -  Alkaline Phos 38 - 126 U/L - - -  AST 15 - 41 U/L - - -  ALT 0 - 44 U/L - - -   CBC Latest Ref Rng & Units 04/19/2020 04/18/2020 04/17/2020  WBC 4.0 - 10.5 K/uL 4.8 5.0 7.4  Hemoglobin 13.0 - 17.0 g/dL 10.4(L) 9.9(L) 11.0(L)  Hematocrit 39.0 - 52.0 % 31.2(L) 29.3(L) 32.7(L)  Platelets 150 - 400 K/uL 196 163 117(L)    Lipid Panel     Component Value Date/Time   CHOL 133 02/02/2020 0956   TRIG 57 02/02/2020 0956   HDL 69 02/02/2020 0956   LDLCALC 52 02/02/2020 0956   HEMOGLOBIN A1C Lab Results  Component Value Date   HGBA1C 5.3 04/12/2020   MPG 105  04/12/2020   TSH No results for input(s): TSH in the last 8760 hours.  External labs:    10/28/2019: Glucose 99, BUN/Cr 12/1.05. EGFR 71. Na/K 135/4.6. Rest of the CMP normal  H/H 16.4/48.6. MCV 91.2. Platelets 240  HbA1C 5.2%  Chol 179, TG 72, HDL 59, LDL 104  TSH 1.39 normal  Medications and allergies   No Known Allergies   Current Outpatient Medications on File Prior to Visit  Medication Sig Dispense Refill   acetaminophen (TYLENOL) 325 MG tablet Take 2 tablets (650 mg total) by mouth every 6 (six) hours as needed for mild pain.     albuterol (VENTOLIN HFA) 108 (90 Base) MCG/ACT inhaler Inhale 1 puff into the lungs every 4 (four) hours as needed for wheezing or shortness of breath. 18 g 5   aspirin EC 81 MG EC tablet Take 1 tablet (81 mg total) by mouth daily. Swallow whole.     Cholecalciferol (VITAMIN D3) 1.25 MG (50000 UT) CAPS Take 50,000 Units by mouth once a week.      fluticasone (FLONASE) 50 MCG/ACT nasal spray Place 1 spray into both nostrils daily as needed for rhinitis.     magnesium oxide (MAG-OX) 400 (241.3 Mg) MG tablet Take 1 tablet (400 mg total) by mouth 2 (two) times daily. 30 tablet 0   nicotine (NICODERM CQ - DOSED IN MG/24 HOURS) 14 mg/24hr patch Place 14 mg onto the skin daily.     rosuvastatin (CRESTOR) 5 MG tablet Take 1 tablet (5 mg total) by mouth daily. 90 tablet 3   tamsulosin (FLOMAX) 0.4 MG CAPS capsule Take 0.4 mg by mouth daily.      No current facility-administered medications on file prior to visit.    Radiology:   CT Chest Lung 12/25/2019: 1. 8.2 mm irregular opacity anterior right lung apex, potentially scarring. Lung-RADS 4A, suspicious. Follow up low-dose chest CT without contrast in 3  months (please use the following order, "CT CHEST LCS NODULE FOLLOW-UP W/O CM") is recommended.  Alternatively, PET may be considered when there is a solid component 81m or larger. 2. 5.1 cm diameter ascending thoracic aorta with dense calcification of the aortic valve. Aortic stenosis would be a consideration. Ascending thoracic aortic aneurysm. Recommend semi-annual imaging followup by CTA or MRA and referral to cardiothoracic surgery if not already obtained. This recommendation follows 2010 ACCF/AHA/AATS/ACR/ASA/SCA/SCAI/SIR/STS/SVM Guidelines for the Diagnosis and Management of Patients With Thoracic Aortic Disease. Circulation. 2010; 121:: G644-I347   3.  Emphysema (ICD10-J43.9) and Aortic Atherosclerosis.  Cardiac Studies:    Abdominal Aortic Duplex  01/13/2020:  The maximum aorta (sac) diameter is 2.22 cm (mid). Mild ectasia noted.  Diffuse plaque observed in the proximal, mid and distal aorta. Normal flow  velocities noted in the aorta and iliac arteries.  No AAA noted. Consider recheck in 10 years for progression to AAA.  ABI 02/12/2020:  This exam reveals normal perfusion of the lower extremity (Bilateral ABI  1.00) with normal triphasic waveform pattern.   Left and right heart catheterization 02/17/2020: Normal coronary arteries with right dominant circulation. Normal right heart cardiac catheterization, preserved cardiac output and cardiac index at 4.22 and 2.29 respectively.  CT surgery 04/14/2020: ? Sievers type I bicuspid aortic valve with severe aortic stenosis ? Fusiform aneurysmal dilitation of the aortic root and entire ascending thoracic aorta ? Normal left ventricular systolic function.  Procedure:       ? Biological Bentall Aortic Root Replacement  Edwards Konect Resilia stented bovine pericardial tissue valved conduit (size 25 mm, model # [id number], serial # [id number])              Reimplantation of left main and right coronary artery             Replacement  of ascending thoracic aortic aneurysm   Carotid artery duplex 12/09/2020: Duplex suggests stenosis in the right and left internal carotid artery (1-15%) with mild heterogeneous plaque. Antegrade right vertebral artery flow. Antegrade left vertebral artery flow. Compared to the study done on 01/13/2020, no significant change. Further studies when clinically indicated.  Echocardiogram 08/31/2020: Left ventricle cavity is normal in size and wall thickness. Normal global wall motion. Normal LV systolic function with EF 57%. Normal diastolic filling pattern. S/p 25 mm bioprosthetic aortic valve replacement ne since previous transthoracic study 01/13/2020. Valve is well seated without any rocking motion. mean PG 7 mmHg, likely normal. Mild to moderate aortic regurgitation, new since intra-op TEE 04/14/2020. No evidence of pulmonary hypertension.     EKG    No significant change from EKG 05/14/2020: Normal sinus rhythm at rate of 70 bpm, normal axis.  No evidence of ischemia, normal EKG.  EKG 01/07/2020: Normal sinus rhythm at rate of 70 bpm, left atrial enlargement, otherwise normal EKG.    Assessment     ICD-10-CM   1. Aortic valve disorder  I35.9     2. S/P aortic valve replacement with bioprosthetic valve  Z95.3 EKG 12-Lead    3. S/P ascending aortic aneurysm repair  Z98.890 losartan (COZAAR) 25 MG tablet   Z86.79     4. Primary hypertension  I10 losartan (COZAAR) 25 MG tablet    Basic metabolic panel    Basic metabolic panel    5. Sinus bradycardia  R00.1       Meds ordered this encounter  Medications   losartan (COZAAR) 25 MG tablet    Sig: Take 1 tablet (25 mg total) by mouth daily.    Dispense:  30 tablet    Refill:  3    Medications Discontinued During This Encounter  Medication Reason   doxycycline (VIBRA-TABS) 100 MG tablet Error   metoprolol tartrate (LOPRESSOR) 25 MG tablet Side effect (s)   nicotine (NICODERM CQ - DOSED IN MG/24 HOURS) 21 mg/24hr patch Dose change     Recommendations:   Kurt Allen  is a  75 y.o. Caucasian male with hypertension, tobacco use disorder, severe aortic valve stenosis confirmed by cardiac catheterization on 02/17/2020 without significant coronary artery disease, a 5.1 cm ascending aortic aneurysm noted during low-dose CT scan of the chest for lung cancer screening, abnormal lung mass and has f/u by Dr. Elsworth Soho.      Patient underwent elective bioprosthetic aortic valve replacement along with aortic root repair on 04/14/2020. He has 100+ year history of tobacco use, started smoking when he is 75 years of age and has been smoking about 2 packs cigarettes a day.  He is presently using nicotine patches that are prescribed and is smoking about 1 cigarette every third day and is trying his best to quit.  Blood pressure is well controlled and lipids are also well controlled.  I had extensive discussion with the patient regarding complete abstinence from tobacco use, he appears motivated.  We will discontinue metoprolol in view of her underlying marked bradycardia although asymptomatic especially given his age and risk for complete heart block in view of aortic valve disease.  We will add losartan  25 mg daily, obtain BMP in 2 weeks.  I would like to see him back in 3 months for follow-up of bradycardia and hypertension.    Adrian Prows, MD, [facility] 01/28/2021, 11:26 AM Office: [phone]   CC: Lilly Cove, MD

## 2021-02-02 ENCOUNTER — Encounter: Payer: Self-pay | Admitting: Cardiology

## 2021-02-10 LAB — BASIC METABOLIC PANEL
BUN/Creatinine Ratio: 11 (ref 10–24)
BUN: 12 mg/dL (ref 8–27)
CO2: 25 mmol/L (ref 20–29)
Calcium: 9.1 mg/dL (ref 8.6–10.2)
Chloride: 98 mmol/L (ref 96–106)
Creatinine, Ser: 1.14 mg/dL (ref 0.76–1.27)
Glucose: 122 mg/dL — ABNORMAL HIGH (ref 65–99)
Potassium: 4.7 mmol/L (ref 3.5–5.2)
Sodium: 136 mmol/L (ref 134–144)
eGFR: 68 mL/min/{1.73_m2} (ref >59)

## 2021-02-24 ENCOUNTER — Other Ambulatory Visit: Payer: Self-pay | Admitting: Cardiology

## 2021-02-24 DIAGNOSIS — E7800 Pure hypercholesterolemia, unspecified: Secondary | ICD-10-CM

## 2021-03-17 ENCOUNTER — Telehealth: Payer: Self-pay | Admitting: Pulmonary Disease

## 2021-03-17 DIAGNOSIS — Z9189 Other specified personal risk factors, not elsewhere classified: Secondary | ICD-10-CM

## 2021-03-17 NOTE — Telephone Encounter (Signed)
Dr Elsworth Soho, pt calling about scheduling nodule f/u CT for august 2022  The last one was low dose Is that what you want to order this time too? Thanks

## 2021-03-18 NOTE — Telephone Encounter (Signed)
Called and spoke with pt letting him know that Dr. Elsworth Soho said to place the order for the CT to be done and let him know that one of PCCs would call him to let him know when/where he would need to go for the CT. Pt verbalized understanding. Pt already has a f/u scheduled so stated to pt to keep that appt. Nothing further needed.

## 2021-03-18 NOTE — Telephone Encounter (Signed)
Patient is returning phone call. Patient phone number is [phone].

## 2021-03-18 NOTE — Telephone Encounter (Signed)
Rigoberto Noel, MD  Rosana Berger, CMA Caller: Unspecified Wilburn Mylar, 12:41 PM) LDCT chest & Fu after please   I placed order for the LDCT   Called the pt and there was no answer- LMTCB

## 2021-04-04 ENCOUNTER — Telehealth: Payer: Self-pay | Admitting: Pulmonary Disease

## 2021-04-04 NOTE — Telephone Encounter (Signed)
Had previously ordered low-dose CT chest but can change to CT angiogram chest with contrast to also evaluate aorta besides lung nodules Please change order accordingly

## 2021-04-05 ENCOUNTER — Other Ambulatory Visit: Payer: Self-pay | Admitting: Pulmonary Disease

## 2021-04-05 DIAGNOSIS — I779 Disorder of arteries and arterioles, unspecified: Secondary | ICD-10-CM

## 2021-04-05 DIAGNOSIS — Z9189 Other specified personal risk factors, not elsewhere classified: Secondary | ICD-10-CM

## 2021-04-07 ENCOUNTER — Other Ambulatory Visit: Payer: Self-pay

## 2021-04-07 ENCOUNTER — Ambulatory Visit
Admission: RE | Admit: 2021-04-07 | Discharge: 2021-04-07 | Disposition: A | Discharge status: Home / Self Care | Payer: Medicare HMO | Source: Ambulatory Visit | Attending: Pulmonary Disease | Admitting: Pulmonary Disease

## 2021-04-07 DIAGNOSIS — R911 Solitary pulmonary nodule: Secondary | ICD-10-CM

## 2021-04-07 DIAGNOSIS — Z9189 Other specified personal risk factors, not elsewhere classified: Secondary | ICD-10-CM

## 2021-04-07 DIAGNOSIS — I779 Disorder of arteries and arterioles, unspecified: Secondary | ICD-10-CM

## 2021-04-07 MED ORDER — IOPAMIDOL (ISOVUE-370) INJECTION 76%
75.0000 mL | Freq: Once | INTRAVENOUS | Status: AC | PRN
  Administered 2021-04-07: 75 mL via INTRAVENOUS

## 2021-04-07 NOTE — Telephone Encounter (Signed)
Orders had been changed by radiology today and patient has had exam. Nothing further needed.

## 2021-04-11 NOTE — Progress Notes (Signed)
Spoke with pt and notified of results per Dr. Alva. Pt verbalized understanding and denied any questions. 

## 2021-04-18 ENCOUNTER — Ambulatory Visit: Payer: Medicare HMO | Admitting: Thoracic Surgery (Cardiothoracic Vascular Surgery)

## 2021-05-10 ENCOUNTER — Encounter: Payer: Self-pay | Admitting: Pulmonary Disease

## 2021-05-10 ENCOUNTER — Other Ambulatory Visit: Payer: Self-pay

## 2021-05-10 ENCOUNTER — Ambulatory Visit: Payer: Medicare HMO | Admitting: Pulmonary Disease

## 2021-05-10 VITALS — BP 102/80 | HR 73 | Temp 97.9°F | Ht 68.0 in | Wt 150.0 lb

## 2021-05-10 DIAGNOSIS — I712 Thoracic aortic aneurysm, without rupture: Secondary | ICD-10-CM

## 2021-05-10 DIAGNOSIS — J432 Centrilobular emphysema: Secondary | ICD-10-CM

## 2021-05-10 DIAGNOSIS — Z720 Tobacco use: Secondary | ICD-10-CM

## 2021-05-10 DIAGNOSIS — Z23 Encounter for immunization: Secondary | ICD-10-CM | POA: Diagnosis not present

## 2021-05-10 DIAGNOSIS — I7121 Aneurysm of the ascending aorta, without rupture: Secondary | ICD-10-CM

## 2021-05-10 NOTE — Progress Notes (Signed)
   Subjective:    Patient ID: Kurt Allen, male    DOB: [birth year], 75 y.o.   MRN: [id number]  HPI  75 yo smoker for FU of emphysema and lung nodule on low-dose CT.   He started smoking at age 65, maximum of 2 packs/day, more than 100 pack years   PMH - AVR with bioprosthetic valve and TAA repair,  Annual visit. Breathing is okay, only using albuterol on an as-needed basis he gets short of breath when he works outside for long time.  Still wants to avoid maintenance inhaler Last exacerbation 10/2020 required antibiotic and steroids. 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 Also denies presyncope, palpitations, heartburn, abdominal pain, nausea, vomiting, diarrhea or change in bowel or urinary habits, dysuria,hematuria, rash, arthralgias, visual complaints, headache, numbness weakness or ataxia.     Objective:   Physical Exam  Gen. Pleasant, well-nourished, in no distress ENT - no thrush, no pallor/icterus,no post nasal drip Neck: No JVD, no thyromegaly, no carotid bruits Lungs: no use of accessory muscles, no dullness to percussion, clear without rales or rhonchi  Cardiovascular: Rhythm regular, heart sounds  normal, no murmurs or  gallops, no peripheral edema Musculoskeletal: No deformities, no cyanosis or clubbing        Assessment & Plan:

## 2021-05-10 NOTE — Patient Instructions (Signed)
CT angiogram chest for aorta in aug 2023 Albuterol as needed You have to QUIT smoking ! FLu shot

## 2021-05-10 NOTE — Assessment & Plan Note (Signed)
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

## 2021-05-10 NOTE — Assessment & Plan Note (Signed)
Smoking cessation emphasized is the most important intervention for him

## 2021-05-10 NOTE — Assessment & Plan Note (Signed)
Again offered him a maintenance inhaler but he would like to avoid.  He prefers to use albuterol on an as-needed basis

## 2021-05-23 ENCOUNTER — Telehealth: Payer: Self-pay | Admitting: Cardiology

## 2021-05-23 ENCOUNTER — Other Ambulatory Visit: Payer: Self-pay

## 2021-05-23 DIAGNOSIS — Z8679 Personal history of other diseases of the circulatory system: Secondary | ICD-10-CM

## 2021-05-23 DIAGNOSIS — I10 Essential (primary) hypertension: Secondary | ICD-10-CM

## 2021-05-23 MED ORDER — LOSARTAN POTASSIUM 25 MG PO TABS: 25.0000 mg | ORAL_TABLET | Freq: Every day | ORAL | 1 refills | Status: DC

## 2021-05-23 NOTE — Telephone Encounter (Signed)
Patient requesting refill for losartan

## 2021-05-23 NOTE — Telephone Encounter (Signed)
Done

## 2021-06-20 ENCOUNTER — Other Ambulatory Visit: Payer: Self-pay

## 2021-06-20 ENCOUNTER — Ambulatory Visit: Payer: Medicare HMO | Admitting: Cardiology

## 2021-06-20 ENCOUNTER — Encounter: Payer: Self-pay | Admitting: Cardiology

## 2021-06-20 VITALS — BP 111/71 | HR 92 | Temp 97.9°F | Resp 17 | Ht 68.0 in | Wt 150.6 lb

## 2021-06-20 DIAGNOSIS — F17200 Nicotine dependence, unspecified, uncomplicated: Secondary | ICD-10-CM

## 2021-06-20 DIAGNOSIS — E7800 Pure hypercholesterolemia, unspecified: Secondary | ICD-10-CM

## 2021-06-20 DIAGNOSIS — Z8679 Personal history of other diseases of the circulatory system: Secondary | ICD-10-CM

## 2021-06-20 DIAGNOSIS — Z953 Presence of xenogenic heart valve: Secondary | ICD-10-CM

## 2021-06-20 MED ORDER — NICOTINE POLACRILEX 4 MG MT GUM: 4.0000 mg | CHEWING_GUM | OROMUCOSAL | 0 refills | Status: DC | PRN

## 2021-06-20 NOTE — Progress Notes (Signed)
[ Primary Physician/Referring:  Timoteo Gaul, FNP  Patient ID: Kurt Allen, male    DOB: 1946/07/11, 75 y.o.   MRN: [id number]  Chief Complaint  Patient presents with   Bradycardia    3 MONTHS   AORTIC VALVE REPLACEMENT   AORTIC ROOT REPLACEMENT   HPI:    Kurt Allen  is a 75 y.o. Caucasian male with hypertension, tobacco use disorder, severe aortic valve stenosis confirmed by cardiac catheterization on 02/17/2020 without significant coronary artery disease, a 5.1 cm ascending aortic aneurysm noted during low-dose CT scan of the chest for lung cancer screening, history of bioprosthetic aortic valve replacement along with aortic root repair on 04/14/2020, this is a 50-monthoffice visit.    On his last office visit I discontinue metoprolol in view of marked sinus bradycardia and advanced age as a risk factor for heart block especially in view of aortic valve disease.  He is tolerating this and has no specific complaints today.  He still smoking about 5 to 6 cigarettes a day as he is not wearing the patch as it causes him to have itching.  Except for chronic dyspnea denies palpitations, pain, claudication.   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/2020   25 mm Edwards Konect Resilia stented bovine pericardial valve synthetic root conduit with reimplantation of left main and right coronary arteries   Severe aortic stenosis    Thoracic ascending aortic aneurysm    Tobacco abuse    Past Surgical History:  Procedure Laterality Date   AORTIC VALVE REPLACEMENT N/A 04/14/2020   Procedure: AORTIC VALVE REPLACEMENT (AVR);  Surgeon: ORexene Alberts MD;  [facility]  Service: Open Heart Surgery;  Laterality: N/A;   BENTALL PROCEDURE N/A 04/14/2020   Procedure: BENTALL AORTIC ROOT  REPLACEMENT USING KONECT RESILIA 25MM AORTIC VALVED CONDUIT WITH REIMPLANTATION OF RIGHT AND LEFT CORONARY BUTTONS;  Surgeon: ORexene Alberts MD;  [place] [facility]  Service: Open Heart Surgery;  Laterality: N/A;   CARDIAC CATHETERIZATION     EYE SURGERY Bilateral    cataracts   RIGHT HEART CATH AND CORONARY ANGIOGRAPHY N/A 02/17/2020   Procedure: RIGHT HEART CATH AND CORONARY ANGIOGRAPHY;  Surgeon: GAdrian Prows MD;  [facility];  Service: Cardiovascular;  Laterality: N/A;   TEE WITHOUT CARDIOVERSION N/A 04/14/2020   Procedure: TRANSESOPHAGEAL ECHOCARDIOGRAM (TEE);  Surgeon: ORexene Alberts MD;  [facility] [facility]  Service: Open Heart Surgery;  Laterality: N/A;   THORACIC AORTIC ANEURYSM REPAIR N/A 04/14/2020   Procedure: THORACIC ASCENDING ANEURYSM REPAIR (AAA);  Surgeon: ORexene Alberts MD;  [place] [facility]  Service: Open Heart Surgery;  Laterality: N/A;   History reviewed. No pertinent family history.  Social History   Tobacco Use   Smoking status: Some Days    Packs/day: 0.25    Years: 50.00    Pack years: 12.50    Types: Cigarettes   Smokeless tobacco: Never   Tobacco comments:    smokes 2-3 day late at night  Substance Use Topics   Alcohol use: Yes    Alcohol/week: 10.0 standard drinks    Types: 10 Cans of beer per week    Comment: per week   Marital Status: Divorced  ROS  Review of Systems  Cardiovascular:  Positive for dyspnea on  exertion. Negative for chest pain and leg swelling.  Gastrointestinal:  Negative for melena.  Objective  Blood pressure 111/71, pulse 92, temperature 97.9 F (36.6 C), temperature source Temporal, resp. rate 17, height _0  (1.727 m), weight 150 lb 9.6 oz (68.3 kg), SpO2 96 %.  Vitals with BMI 06/20/2021 05/10/2021 01/28/2021  Height _1  _2  -  Weight 150 lbs 10 oz 150 lbs -  BMI 61.9 50.93 -  Systolic 267 124 580  Diastolic 71 80 78  Pulse 92 73 47     Physical Exam Constitutional:      Appearance: He is  well-developed.  Cardiovascular:     Rate and Rhythm: Bradycardia present.     Pulses:          Carotid pulses are  on the left side with bruit.      Femoral pulses are 2+ on the right side.      Popliteal pulses are 2+ on the right side and 2+ on the left side.       Dorsalis pedis pulses are 1+ on the right side and 2+ on the left side.       Posterior tibial pulses are 2+ on the right side and 2+ on the left side.     Heart sounds: Murmur heard.  Early systolic murmur is present with a grade of 2/6 at the upper right sternal border.    No gallop.     Comments: No pedal edema. No JVD Pulmonary:     Effort: Pulmonary effort is normal.     Breath sounds: Decreased breath sounds (barrel shaped chest with emphysema) present.  Abdominal:     General: Bowel sounds are normal.     Palpations: Abdomen is soft.   Laboratory examination:   Recent Labs    02/09/21 1424  NA 136  K 4.7  CL 98  CO2 25  GLUCOSE 122*  BUN 12  CREATININE 1.14  CALCIUM 9.1   CrCl cannot be calculated (Patient's most recent lab result is older than the maximum 21 days allowed.).  CMP Latest Ref Rng & Units 02/09/2021 04/20/2020 04/19/2020  Glucose 65 - 99 mg/dL 122(H) 113(H) 109(H)  BUN 8 - 27 mg/dL _3 Creatinine 0.76 - 1.27 mg/dL 1.14 1.03 0.93  Sodium 134 - 144 mmol/L 136 132(L) 128(L)  Potassium 3.5 - 5.2 mmol/L 4.7 3.7 3.9  Chloride 96 - 106 mmol/L 98 92(L) 90(L)  CO2 20 - 29 mmol/L _4 Calcium 8.6 - 10.2 mg/dL 9.1 8.5(L) 8.4(L)  Total Protein 6.5 - 8.1 g/dL - - -  Total Bilirubin 0.3 - 1.2 mg/dL - - -  Alkaline Phos 38 - 126 U/L - - -  AST 15 - 41 U/L - - -  ALT 0 - 44 U/L - - -   CBC Latest Ref Rng & Units 04/19/2020 04/18/2020 04/17/2020  WBC 4.0 - 10.5 K/uL 4.8 5.0 7.4  Hemoglobin 13.0 - 17.0 g/dL 10.4(L) 9.9(L) 11.0(L)  Hematocrit 39.0 - 52.0 % 31.2(L) 29.3(L) 32.7(L)  Platelets 150 - 400 K/uL 196 163 117(L)   Lipid Panel     Component Value Date/Time   CHOL 133 02/02/2020  0956   TRIG 57 02/02/2020 0956   HDL 69 02/02/2020 0956   LDLCALC 52 02/02/2020 0956   HEMOGLOBIN A1C Lab Results  Component Value Date   HGBA1C 5.3 04/12/2020   MPG 105 04/12/2020   TSH No results for input(s): TSH in the  last 8760 hours.  External labs:    10/28/2019: Glucose 99, BUN/Cr 12/1.05. EGFR 71. Na/K 135/4.6. Rest of the CMP normal  H/H 16.4/48.6. MCV 91.2. Platelets 240  HbA1C 5.2%  Chol 179, TG 72, HDL 59, LDL 104  TSH 1.39 normal  Medications and allergies   No Known Allergies   Current Outpatient Medications on File Prior to Visit  Medication Sig Dispense Refill   acetaminophen (TYLENOL) 325 MG tablet Take 2 tablets (650 mg total) by mouth every 6 (six) hours as needed for mild pain.     albuterol (VENTOLIN HFA) 108 (90 Base) MCG/ACT inhaler Inhale 1 puff into the lungs every 4 (four) hours as needed for wheezing or shortness of breath. 18 g 5   aspirin EC 81 MG EC tablet Take 1 tablet (81 mg total) by mouth daily. Swallow whole.     Cholecalciferol (VITAMIN D3) 1.25 MG (50000 UT) CAPS Take 50,000 Units by mouth once a week.      fluticasone (FLONASE) 50 MCG/ACT nasal spray Place 1 spray into both nostrils daily as needed for rhinitis.     losartan (COZAAR) 25 MG tablet Take 1 tablet (25 mg total) by mouth daily. 90 tablet 1   magnesium oxide (MAG-OX) 400 (241.3 Mg) MG tablet Take 1 tablet (400 mg total) by mouth 2 (two) times daily. 30 tablet 0   rosuvastatin (CRESTOR) 5 MG tablet TAKE 1 TABLET EVERY DAY 90 tablet 3   tamsulosin (FLOMAX) 0.4 MG CAPS capsule Take 0.4 mg by mouth daily.      No current facility-administered medications on file prior to visit.    Radiology:   CT Chest Lung 04/07/2021: 1. Interval AVR and ascending thoracic aortic graft repair without apparent complication. 2. 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 Recommend annual imaging followup by CTA or MRA. This recommendation follows 2010  ACCF/AHA/AATS/ACR/ASA/SCA/SCAI/SIR/STS/SVM Guidelines for the Diagnosis and Management of Patients with Thoracic Aortic Disease. Circulation.2010; 121: C166-A630 3. 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 No new lesions  Cardiac Studies:    Abdominal Aortic Duplex  01/13/2020:  The maximum aorta (sac) diameter is 2.22 cm (mid). Mild ectasia noted.  Diffuse plaque observed in the proximal, mid and distal aorta. Normal flow  velocities noted in the aorta and iliac arteries.  No AAA noted. Consider recheck in 10 years for progression to AAA.  ABI 02/12/2020:  This exam reveals normal perfusion of the lower extremity (Bilateral ABI  1.00) with normal triphasic waveform pattern.   Left and right heart catheterization 02/17/2020: Normal coronary arteries with right dominant circulation. Normal right heart cardiac catheterization, preserved cardiac output and cardiac index at 4.22 and 2.29 respectively.  CT surgery 04/14/2020: ? Sievers type I bicuspid aortic valve with severe aortic stenosis ? Fusiform aneurysmal dilitation of the aortic root and entire ascending thoracic aorta ? Normal left ventricular systolic function.  Procedure:       ? Biological Bentall Aortic Root Replacement             Edwards Konect Resilia stented bovine pericardial tissue valved conduit (size 25 mm, model # [id number], serial # [id number])              Reimplantation of left main and right coronary artery             Replacement of ascending thoracic aortic aneurysm   Carotid artery duplex 12/09/2020: Duplex suggests stenosis in the right and left internal carotid artery (1-15%) with mild heterogeneous  plaque. Antegrade right vertebral artery flow. Antegrade left vertebral artery flow. Compared to the study done on 01/13/2020, no significant change. Further studies when clinically indicated.  Echocardiogram 08/31/2020: Left ventricle cavity is normal in size and wall thickness. Normal global wall  motion. Normal LV systolic function with EF 57%. Normal diastolic filling pattern. S/p 25 mm bioprosthetic aortic valve replacement ne since previous transthoracic study 01/13/2020. Valve is well seated without any rocking motion. mean PG 7 mmHg, likely normal. Mild to moderate aortic regurgitation, new since intra-op TEE 04/14/2020. No evidence of pulmonary hypertension.     EKG   EKG 06/20/2021: Normal sinus rhythm with rate of 90 bpm, rightward axis, no evidence of ischemia.    EKG 01/28/2021: Marked sinus bradycardia at rate of 47 bpm, left atrial enlargement, otherwise normal EKG.  Assessment     ICD-10-CM   1. Tobacco use disorder  F17.200 nicotine polacrilex (NICORETTE) 4 MG gum    2. S/P aortic valve replacement with bioprosthetic valve  Z95.3 EKG 12-Lead    CBC    CMP14+EGFR    3. S/P ascending aortic aneurysm repair  Z98.890    Z86.79     4. Pure hypercholesterolemia  E78.00 LDL cholesterol, direct    Lipid Panel With LDL/HDL Ratio      Meds ordered this encounter  Medications   nicotine polacrilex (NICORETTE) 4 MG gum    Sig: Take 1 each (4 mg total) by mouth as needed for smoking cessation.    Dispense:  100 tablet    Refill:  0     Medications Discontinued During This Encounter  Medication Reason   nicotine (NICODERM CQ - DOSED IN MG/24 HOURS) 14 mg/24hr patch Side effect (s)     Recommendations   Jad Johansson  is a  75 y.o. Caucasian male with hypertension, tobacco use disorder, severe aortic valve stenosis confirmed by cardiac catheterization on 02/17/2020 without significant coronary artery disease, a 5.1 cm ascending aortic aneurysm noted during low-dose CT scan of the chest for lung cancer screening, history of bioprosthetic aortic valve replacement along with aortic root repair on 04/14/2020, this is a 61-monthoffice visit.    .  On his last office visit I discontinue metoprolol in view of marked sinus bradycardia and advanced age as a risk factor  for heart block especially in view of aortic valve disease.  Heart rate is improved.  He is presently tolerating losartan.  We will obtain routine labs including CMP, CBC and lipid profile testing.  Blood pressure is normal, continue losartan for now.  With regard to tobacco use, although his using nicotine patches, he has not been using them regularly as he has significant itching hence I discontinued this and switch him to Nicorette gum.  He will try his best to quit smoking.  I will see him back in 6 months.  He does have a lung mass that is being followed by pulmonary medicine, recent CT scan reviewed, fortunately stable.  With regard to aortic aneurysm and aortic valve replacement, stable with no change in physical exam.  I will see him back in 6 months.   JAdrian Prows MD, F[facility]10/31/2022, 9:49 AM Office: [phone]  CC: CLilly Cove MD

## 2021-06-30 LAB — CBC
Hematocrit: 48.3 % (ref 37.5–51.0)
Hemoglobin: 16.7 g/dL (ref 13.0–17.7)
MCH: 30.8 pg (ref 26.6–33.0)
MCHC: 34.6 g/dL (ref 31.5–35.7)
MCV: 89 fL (ref 79–97)
Platelets: 269 10*3/uL (ref 150–450)
RBC: 5.42 x10E6/uL (ref 4.14–5.80)
RDW: 13.1 % (ref 11.6–15.4)
WBC: 6.5 10*3/uL (ref 3.4–10.8)

## 2021-06-30 LAB — CMP14+EGFR
ALT: 14 IU/L (ref 0–44)
AST: 13 IU/L (ref 0–40)
Albumin/Globulin Ratio: 2.1 (ref 1.2–2.2)
Albumin: 4.5 g/dL (ref 3.7–4.7)
Alkaline Phosphatase: 72 IU/L (ref 44–121)
BUN/Creatinine Ratio: 9 — ABNORMAL LOW (ref 10–24)
BUN: 9 mg/dL (ref 8–27)
Bilirubin Total: 0.5 mg/dL (ref 0.0–1.2)
CO2: 26 mmol/L (ref 20–29)
Calcium: 9.5 mg/dL (ref 8.6–10.2)
Chloride: 100 mmol/L (ref 96–106)
Creatinine, Ser: 1.02 mg/dL (ref 0.76–1.27)
Globulin, Total: 2.1 g/dL (ref 1.5–4.5)
Glucose: 88 mg/dL (ref 70–99)
Potassium: 4.3 mmol/L (ref 3.5–5.2)
Sodium: 140 mmol/L (ref 134–144)
Total Protein: 6.6 g/dL (ref 6.0–8.5)
eGFR: 77 mL/min/{1.73_m2} (ref >59)

## 2021-06-30 LAB — LIPID PANEL WITH LDL/HDL RATIO
Cholesterol, Total: 153 mg/dL (ref 100–199)
HDL: 70 mg/dL (ref >39)
LDL Chol Calc (NIH): 68 mg/dL (ref 0–99)
LDL/HDL Ratio: 1 ratio (ref 0.0–3.6)
Triglycerides: 82 mg/dL (ref 0–149)
VLDL Cholesterol Cal: 15 mg/dL (ref 5–40)

## 2021-06-30 LAB — LDL CHOLESTEROL, DIRECT: LDL Direct: 63 mg/dL (ref 0–99)

## 2021-07-06 NOTE — Progress Notes (Signed)
Called and spoke to pt, pt voiced understanding.

## 2021-11-10 ENCOUNTER — Encounter: Payer: Self-pay | Admitting: Adult Health

## 2021-11-10 ENCOUNTER — Ambulatory Visit: Payer: Medicare HMO | Admitting: Adult Health

## 2021-11-10 ENCOUNTER — Other Ambulatory Visit: Payer: Self-pay

## 2021-11-10 DIAGNOSIS — J432 Centrilobular emphysema: Secondary | ICD-10-CM | POA: Diagnosis not present

## 2021-11-10 DIAGNOSIS — F17210 Nicotine dependence, cigarettes, uncomplicated: Secondary | ICD-10-CM

## 2021-11-10 DIAGNOSIS — Z720 Tobacco use: Secondary | ICD-10-CM

## 2021-11-10 DIAGNOSIS — R911 Solitary pulmonary nodule: Secondary | ICD-10-CM

## 2021-11-10 DIAGNOSIS — J3089 Other allergic rhinitis: Secondary | ICD-10-CM

## 2021-11-10 DIAGNOSIS — Z9189 Other specified personal risk factors, not elsewhere classified: Secondary | ICD-10-CM

## 2021-11-10 DIAGNOSIS — J309 Allergic rhinitis, unspecified: Secondary | ICD-10-CM | POA: Insufficient documentation

## 2021-11-10 NOTE — Assessment & Plan Note (Signed)
Smoking cessation discussed 

## 2021-11-10 NOTE — Patient Instructions (Addendum)
Mucinex DM Twice daily  For cough/congestion  ?Albuterol inhaler As needed   ?Activity as tolerated.  ?Work on not smoking  ?Prevnar 20 today  ?Saline nasal spray As needed   ?Flonase daily as needed.  ?Follow up Dr. Elsworth Soho  In 6 months and As needed   ?Please contact office for sooner follow up if symptoms do not improve or worsen or seek emergency care  ? ?

## 2021-11-10 NOTE — Assessment & Plan Note (Signed)
CT chest 03/221 stable scarring in Right apex .  ?

## 2021-11-10 NOTE — Progress Notes (Signed)
? ?'@Patient'$  ID: Kurt Allen, male    DOB: [birth year], 76 y.o.   MRN: [id number] ? ?Chief Complaint  ?Patient presents with  ? Follow-up  ? ? ?Referring provider: ?Timoteo Gaul, FNP ? ?HPI: ?76 year old male smoker 100-pack-year history followed for emphysema and lung nodule ?Medical history significant for severe aortic stenosis and AAA status post AVR in August 2021 ? ? ?TEST/EVENTS :  ?PFTs 03/2020-moderate airway obstruction, ratio 54, FEV1 75%, FVC 90%, TLC 1 1 8% and DLCO 62% ?LDCT 03/2020 >> no nodules noted ?LDCT 12/2019 >> 8.2 mm irregular opacity anterior right lung apex , 5.1 cm diameter ascending thoracic aorta with dense calcification of the aortic valve ?  ?Echo 12/2019-severe AS, peak gradient 44 mm ?LHC 01/2020 normal coronaries ?  ?11/10/2021 Follow up ; Emphysema and Lung nodule  ?Patient presents for 8-monthfollow-up.  Patient has underlying COPD with emphysema.  He has an active smoker.  Patient says he has been cutting back quite a bit on his smoking.  He is using nicotine patches to help with smoking cessation.  He is down to 3 to 4 cigarettes a day. ?Patient says overall breathing is doing about the same.  He gets short of breath with heavy activities.  Has intermittent cough that is minimally productive.  COVID-vaccine (x4) is up-to-date. Influenza . Is due for Pneumonia vaccine .  ?Patient is not on a maintenance inhaler.  Uses his albuterol as needed. ? ?CT chest April 07, 2021 showed stable AVR and ascending thoracic aortic graft repair, 3.8 cm aortic arch aneurysm, stable pleural-based scarring in the right lung apex ? ?Lives at home with friend. Sedentary lifestyle. Cutting back on smoking .  ?Drives . Shops and household chores.  ?Is thinking about joining the gym to walk and get more active.  ?Weight has been stable. No weight loss. No hemoptysis  ? ? ? ?No Known Allergies ? ?Immunization History  ?Administered Date(s) Administered  ? Fluad Quad(high Dose 65+) 05/13/2020,  05/10/2021  ? Influenza-Unspecified 07/08/2019  ? PFIZER(Purple Top)SARS-COV-2 Vaccination 10/14/2019, 11/04/2019, 08/05/2020  ? ? ?Past Medical History:  ?Diagnosis Date  ? Centrilobular emphysema ([place]   ? Hyperlipidemia   ? Hypertension   ? S/P ascending aortic aneurysm repair 04/14/2020  ? 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 25 mm Edwards Konect Resilia stented bovine pericardial valve synthetic root conduit with reimplantation of left main and right coronary arteries  ? Severe aortic stenosis   ? Thoracic ascending aortic aneurysm   ? Tobacco abuse   ? ? ?Tobacco History: ?Social History  ? ?Tobacco Use  ?Smoking Status Some Days  ? Packs/day: 2.00  ? Years: 50.00  ? Pack years: 100.00  ? Types: Cigarettes  ?Smokeless Tobacco Never  ?Tobacco Comments  ? smokes 1 pack or less in a week.  Does not smoke on a daily basis.  ? ?Ready to quit: No ?Counseling given: Yes ?Tobacco comments: smokes 1 pack or less in a week.  Does not smoke on a daily basis. ? ? ?Outpatient Medications Prior to Visit  ?Medication Sig Dispense Refill  ? acetaminophen (TYLENOL) 325 MG tablet Take 2 tablets (650 mg total) by mouth every 6 (six) hours as needed for mild pain.    ? albuterol (VENTOLIN HFA) 108 (90 Base) MCG/ACT inhaler Inhale 1 puff into the lungs every 4 (four) hours as needed for wheezing or shortness of breath. 18 g 5  ? aspirin EC 81 MG EC  tablet Take 1 tablet (81 mg total) by mouth daily. Swallow whole.    ? Cholecalciferol (VITAMIN D3) 1.25 MG (50000 UT) CAPS Take 50,000 Units by mouth once a week.     ? fluticasone (FLONASE) 50 MCG/ACT nasal spray Place 1 spray into both nostrils daily as needed for rhinitis.    ? magnesium oxide (MAG-OX) 400 (241.3 Mg) MG tablet Take 1 tablet (400 mg total) by mouth 2 (two) times daily. 30 tablet 0  ? nicotine polacrilex (NICORETTE) 4 MG gum Take 1 each (4 mg total) by mouth as needed for smoking cessation. 100 tablet 0  ? rosuvastatin  (CRESTOR) 5 MG tablet TAKE 1 TABLET EVERY DAY 90 tablet 3  ? tamsulosin (FLOMAX) 0.4 MG CAPS capsule Take 0.4 mg by mouth daily.     ? losartan (COZAAR) 25 MG tablet Take 1 tablet (25 mg total) by mouth daily. 90 tablet 1  ? ?No facility-administered medications prior to visit.  ? ? ? ?Review of Systems:  ? ?Constitutional:   No  weight loss, night sweats,  Fevers, chills,  ?+fatigue, or  lassitude. ? ?HEENT:   No headaches,  Difficulty swallowing,  Tooth/dental problems, or  Sore throat,  ?              No sneezing, itching, ear ache, nasal congestion, post nasal drip,  ? ?CV:  No chest pain,  Orthopnea, PND, swelling in lower extremities, anasarca, dizziness, palpitations, syncope.  ? ?GI  No heartburn, indigestion, abdominal pain, nausea, vomiting, diarrhea, change in bowel habits, loss of appetite, bloody stools.  ? ?Resp:  No excess mucus, no productive cough,  No non-productive cough,  No coughing up of blood.  No change in color of mucus.  No wheezing.  No chest wall deformity ? ?Skin: no rash or lesions. ? ?GU: no dysuria, change in color of urine, no urgency or frequency.  No flank pain, no hematuria  ? ?MS:  No joint pain or swelling.  No decreased range of motion.  No back pain. ? ? ? ?Physical Exam ? ?BP 124/70 (BP Location: Left Arm, Patient Position: Sitting, Cuff Size: Normal)   Pulse 78   Temp 98 ?F (36.7 ?C) (Oral)   Ht '5\' 8"'$  (1.727 m)   Wt 152 lb 3.2 oz (69 kg)   SpO2 97%   BMI 23.14 kg/m?  ? ?GEN: A/Ox3; pleasant , NAD, well nourished , elderly  ?  ?HEENT:  [place]/AT,  NOSE-clear, THROAT-clear, no lesions, no postnasal drip or exudate noted.  ? ?NECK:  Supple w/ fair ROM; no JVD; normal carotid impulses w/o bruits; no thyromegaly or nodules palpated; no lymphadenopathy.   ? ?RESP  Clear  P & A; w/o, wheezes/ rales/ or rhonchi. no accessory muscle use, no dullness to percussion ? ?CARD:  RRR, no m/r/g, no peripheral edema, pulses intact, no cyanosis or clubbing. ? ?GI:   Soft & nt; nml bowel  sounds; no organomegaly or masses detected.  ? ?Musco: Warm bil, no deformities or joint swelling noted.  ? ?Neuro: alert, no focal deficits noted.   ? ?Skin: Warm, no lesions or rashes ? ? ? ?Lab Results: ? ?CBC ?   ?Component Value Date/Time  ? WBC 6.5 06/29/2021 1016  ? WBC 4.8 04/19/2020 0158  ? RBC 5.42 06/29/2021 1016  ? RBC 3.40 (L) 04/19/2020 0158  ? HGB 16.7 06/29/2021 1016  ? HCT 48.3 06/29/2021 1016  ? PLT 269 06/29/2021 1016  ? MCV 89 06/29/2021 1016  ?  MCH 30.8 06/29/2021 1016  ? MCH 30.6 04/19/2020 0158  ? MCHC 34.6 06/29/2021 1016  ? MCHC 33.3 04/19/2020 0158  ? RDW 13.1 06/29/2021 1016  ? ? ?BMET ?   ?Component Value Date/Time  ? NA 140 06/29/2021 1016  ? K 4.3 06/29/2021 1016  ? CL 100 06/29/2021 1016  ? CO2 26 06/29/2021 1016  ? GLUCOSE 88 06/29/2021 1016  ? GLUCOSE 113 (H) 04/20/2020 0140  ? BUN 9 06/29/2021 1016  ? CREATININE 1.02 06/29/2021 1016  ? CREATININE 1.04 04/06/2020 1406  ? CALCIUM 9.5 06/29/2021 1016  ? GFRNONAA >60 04/20/2020 0140  ? GFRAA >60 04/20/2020 0140  ? ? ?BNP ?No results found for: BNP ? ?ProBNP ?No results found for: PROBNP ? ?Imaging: ?No results found. ? ? ? ? ?  Latest Ref Rng & Units 04/13/2020  ?  9:52 AM  ?PFT Results  ?FVC-Pre L 3.98    ?FVC-Predicted Pre % 100    ?FVC-Post L 4.00    ?FVC-Predicted Post % 100    ?Pre FEV1/FVC % % 54    ?Post FEV1/FCV % % 53    ?FEV1-Pre L 2.17    ?FEV1-Predicted Pre % 75    ?FEV1-Post L 2.14    ?DLCO uncorrected ml/min/mmHg 15.01    ?DLCO UNC% % 62    ?DLCO corrected ml/min/mmHg 14.97    ?DLCO COR %Predicted % 62    ?DLVA Predicted % 64    ?TLC L 7.85    ?TLC % Predicted % 118    ?RV % Predicted % 144    ? ? ?No results found for: NITRICOXIDE ? ? ? ? ? ?Assessment & Plan:  ? ?Centrilobular emphysema ([place]) ?Compensated on present regimen  ?Needs Prevnar 20 .  ?Flu and Covid utd.  ?Declines maintenance inhaler  ?Use albuterol inhaler As needed   ? ?Plan  ?Patient Instructions  ?Mucinex DM Twice daily  For cough/congestion  ?Albuterol  inhaler As needed   ?Activity as tolerated.  ?Work on not smoking  ?Prevnar 20 today  ?Saline nasal spray As needed   ?Flonase daily as needed.  ?Follow up Dr. Elsworth Soho  In 6 months and As needed   ?Please contact office for

## 2021-11-10 NOTE — Assessment & Plan Note (Signed)
Compensated ,  ?flonase and saline As needed   ? ?

## 2021-11-10 NOTE — Assessment & Plan Note (Signed)
Compensated on present regimen  ?Needs Prevnar 20 .  ?Flu and Covid utd.  ?Declines maintenance inhaler  ?Use albuterol inhaler As needed   ? ?Plan  ?Patient Instructions  ?Mucinex DM Twice daily  For cough/congestion  ?Albuterol inhaler As needed   ?Activity as tolerated.  ?Work on not smoking  ?Prevnar 20 today  ?Saline nasal spray As needed   ?Flonase daily as needed.  ?Follow up Dr. Elsworth Soho  In 6 months and As needed   ?Please contact office for sooner follow up if symptoms do not improve or worsen or seek emergency care  ? ? ' ?

## 2021-11-16 ENCOUNTER — Other Ambulatory Visit: Payer: Self-pay

## 2021-11-16 DIAGNOSIS — I10 Essential (primary) hypertension: Secondary | ICD-10-CM

## 2021-11-16 DIAGNOSIS — Z8679 Personal history of other diseases of the circulatory system: Secondary | ICD-10-CM

## 2021-11-16 MED ORDER — LOSARTAN POTASSIUM 25 MG PO TABS: 25.0000 mg | ORAL_TABLET | Freq: Every day | ORAL | 1 refills | Status: DC

## 2021-12-19 ENCOUNTER — Ambulatory Visit (INDEPENDENT_AMBULATORY_CARE_PROVIDER_SITE_OTHER): Payer: Medicare HMO | Admitting: Cardiology

## 2021-12-19 ENCOUNTER — Encounter: Payer: Self-pay | Admitting: Cardiology

## 2021-12-19 VITALS — BP 110/71 | HR 98 | Temp 97.2°F | Resp 16 | Ht 68.0 in | Wt 150.0 lb

## 2021-12-19 DIAGNOSIS — I10 Essential (primary) hypertension: Secondary | ICD-10-CM

## 2021-12-19 DIAGNOSIS — Z8679 Personal history of other diseases of the circulatory system: Secondary | ICD-10-CM

## 2021-12-19 DIAGNOSIS — Z953 Presence of xenogenic heart valve: Secondary | ICD-10-CM

## 2021-12-19 NOTE — Progress Notes (Signed)
?[ ?Primary Physician/Referring:  No primary care provider on file. ? ?Patient ID: Kurt Allen, male    DOB: [birth year], 76 y.o.   MRN: [id number] ? ?Chief Complaint  ?Patient presents with  ? Hypertension  ? Hyperlipidemia  ? Aortic valve replacement  ? Follow-up  ?  6 month  ? ?HPI:   ? ?Kurt Allen  is a 76 y.o. Caucasian male with hypertension, tobacco use disorder, severe aortic valve stenosis confirmed by cardiac catheterization on 02/17/2020 without significant coronary artery disease, a 5.1 cm ascending aortic aneurysm noted during low-dose CT scan of the chest for lung cancer screening, history of bioprosthetic aortic valve replacement along with aortic root repair on 04/14/2020, this is a 1-monthoffice visit.   ? ?On his last office visit I discontinue metoprolol in view of marked sinus bradycardia and advanced age as a risk factor for heart block especially in view of aortic valve disease.  He is tolerating this and has no specific complaints today.  He still smoking about 5 to 6 cigarettes a day as he is not wearing the patch as it causes him to have itching.  Except for chronic dyspnea denies palpitations, pain, claudication.  ? ?Past Medical History:  ?Diagnosis Date  ? Centrilobular emphysema ([place]   ? Hyperlipidemia   ? Hypertension   ? S/P ascending aortic aneurysm repair 04/14/2020  ? 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 25 mm Edwards Konect Resilia stented bovine pericardial valve synthetic root conduit with reimplantation of left main and right coronary arteries  ? Severe aortic stenosis   ? Thoracic ascending aortic aneurysm [place]   ? Tobacco abuse   ? ?Past Surgical History:  ?Procedure Laterality Date  ? AORTIC VALVE REPLACEMENT N/A 04/14/2020  ? Procedure: AORTIC VALVE REPLACEMENT (AVR);  Surgeon: ORexene Alberts MD;  [facility]  Service: Open Heart Surgery;  Laterality: N/A;  ? BENTALL PROCEDURE N/A 04/14/2020  ? Procedure:  BENTALL AORTIC ROOT REPLACEMENT USING KONECT RESILIA 25MM AORTIC VALVED CONDUIT WITH REIMPLANTATION OF RIGHT AND LEFT CORONARY BUTTONS;  Surgeon: ORexene Alberts MD;  [place] [facility]  Service: Open Heart Surgery;  Laterality: N/A;  ? CARDIAC CATHETERIZATION    ? EYE SURGERY Bilateral   ? cataracts  ? RIGHT HEART CATH AND CORONARY ANGIOGRAPHY N/A 02/17/2020  ? Procedure: RIGHT HEART CATH AND CORONARY ANGIOGRAPHY;  Surgeon: GAdrian Prows MD;  [facility];  Service: Cardiovascular;  Laterality: N/A;  ? TEE WITHOUT CARDIOVERSION N/A 04/14/2020  ? Procedure: TRANSESOPHAGEAL ECHOCARDIOGRAM (TEE);  Surgeon: ORexene Alberts MD;  [place] [facility]  Service: Open Heart Surgery;  Laterality: N/A;  ? THORACIC AORTIC ANEURYSM REPAIR N/A 04/14/2020  ? Procedure: THORACIC ASCENDING ANEURYSM REPAIR (AAA);  Surgeon: ORexene Alberts MD;  [place] [facility]  Service: Open Heart Surgery;  Laterality: N/A;  ? ?History reviewed. No pertinent family history.  ?Social History  ? ?Tobacco Use  ? Smoking status: Some Days  ?  Packs/day: 2.00  ?  Years: 50.00  ?  Pack years: 100.00  ?  Types: Cigarettes  ? Smokeless tobacco: Never  ? Tobacco comments:  ?  smokes 1 pack or less in a week.  Does not smoke on a daily basis.  ?Substance Use Topics  ? Alcohol use: Yes  ?  Alcohol/week: 10.0 standard drinks  ?  Types: 10 Cans of beer per week  ?  Comment: per week  ? ?Marital Status:  Divorced  ?ROS  ?Review of Systems  ?Cardiovascular:  Positive for dyspnea on exertion (Chronic and stable). Negative for chest pain and leg swelling.  ?Gastrointestinal:  Negative for melena.  ?Objective  ?Blood pressure 110/71, pulse 98, temperature (!) 97.2 ?F (36.2 ?C), resp. rate 16, height '5\' 8"'$  (1.727 m), weight 150 lb (68 kg), SpO2 98 %.  ? ?  12/19/2021  ?  1:36 PM 11/10/2021  ?  2:15 PM 06/20/2021  ?  9:16 AM  ?Vitals with BMI  ?Height '5\' 8"'$  '5\' 8"'$  '5\' 8"'$   ?Weight 150 lbs 152 lbs 3 oz 150 lbs 10 oz  ?BMI 22.81 23.15 22.9  ?Systolic 829 562 130   ?Diastolic 71 70 71  ?Pulse 98 78 92  ?  ? Physical Exam ?Constitutional:   ?   Appearance: He is well-developed.  ?Cardiovascular:  ?   Rate and Rhythm: Bradycardia present.  ?   Pulses:     ?     Carotid pulses are  on the left side with bruit. ?     Femoral pulses are 2+ on the right side. ?     Popliteal pulses are 2+ on the right side and 2+ on the left side.  ?     Dorsalis pedis pulses are 1+ on the right side and 2+ on the left side.  ?     Posterior tibial pulses are 2+ on the right side and 2+ on the left side.  ?   Heart sounds: Murmur heard.  ?Early systolic murmur is present with a grade of 1/6 at the upper right sternal border.  ?  No gallop.  ?   Comments: No pedal edema. No JVD ?Pulmonary:  ?   Effort: Pulmonary effort is normal.  ?   Breath sounds: Decreased breath sounds (barrel shaped chest with emphysema) present.  ?Abdominal:  ?   General: Bowel sounds are normal.  ?   Palpations: Abdomen is soft.  ? ?Laboratory examination:  ? ?Recent Labs  ?  02/09/21 ?1424 06/29/21 ?1016  ?NA 136 140  ?K 4.7 4.3  ?CL 98 100  ?CO2 25 26  ?GLUCOSE 122* 88  ?BUN 12 9  ?CREATININE 1.14 1.02  ?CALCIUM 9.1 9.5  ? ?CrCl cannot be calculated (Patient's most recent lab result is older than the maximum 21 days allowed.).  ? ?  Latest Ref Rng & Units 06/29/2021  ? 10:16 AM 02/09/2021  ?  2:24 PM 04/20/2020  ?  1:40 AM  ?CMP  ?Glucose 70 - 99 mg/dL 88   122   113    ?BUN 8 - 27 mg/dL '9   12   12    '$ ?Creatinine 0.76 - 1.27 mg/dL 1.02   1.14   1.03    ?Sodium 134 - 144 mmol/L 140   136   132    ?Potassium 3.5 - 5.2 mmol/L 4.3   4.7   3.7    ?Chloride 96 - 106 mmol/L 100   98   92    ?CO2 20 - 29 mmol/L '26   25   29    '$ ?Calcium 8.6 - 10.2 mg/dL 9.5   9.1   8.5    ?Total Protein 6.0 - 8.5 g/dL 6.6      ?Total Bilirubin 0.0 - 1.2 mg/dL 0.5      ?Alkaline Phos 44 - 121 IU/L 72      ?AST 0 - 40 IU/L 13      ?ALT  0 - 44 IU/L 14      ? ? ?  Latest Ref Rng & Units 06/29/2021  ? 10:16 AM 04/19/2020  ?  1:58 AM 04/18/2020  ?  4:47 AM   ?CBC  ?WBC 3.4 - 10.8 x10E3/uL 6.5   4.8   5.0    ?Hemoglobin 13.0 - 17.7 g/dL 16.7   10.4   9.9    ?Hematocrit 37.5 - 51.0 % 48.3   31.2   29.3    ?Platelets 150 - 450 x10E3/uL 269   196   163    ? ?Lipid Panel  ?   ?Component Value Date/Time  ? CHOL 153 06/29/2021 1016  ? TRIG 82 06/29/2021 1016  ? HDL 70 06/29/2021 1016  ? [place] 68 06/29/2021 1016  ? LDLDIRECT 63 06/29/2021 1016  ? ?HEMOGLOBIN A1C ?Lab Results  ?Component Value Date  ? HGBA1C 5.3 04/12/2020  ? MPG 105 04/12/2020  ? ?TSH ?No results for input(s): TSH in the last 8760 hours. ? ?Medications and allergies  ? ?No Known Allergies  ? ?Current Outpatient Medications:  ?  acetaminophen (TYLENOL) 325 MG tablet, Take 2 tablets (650 mg total) by mouth every 6 (six) hours as needed for mild pain., Disp: , Rfl:  ?  albuterol (VENTOLIN HFA) 108 (90 Base) MCG/ACT inhaler, Inhale 1 puff into the lungs every 4 (four) hours as needed for wheezing or shortness of breath., Disp: 18 g, Rfl: 5 ?  aspirin EC 81 MG EC tablet, Take 1 tablet (81 mg total) by mouth daily. Swallow whole., Disp: , Rfl:  ?  Cholecalciferol (VITAMIN D3) 1.25 MG (50000 UT) CAPS, Take 50,000 Units by mouth once a week. , Disp: , Rfl:  ?  fluticasone (FLONASE) 50 MCG/ACT nasal spray, Place 1 spray into both nostrils daily as needed for rhinitis., Disp: , Rfl:  ?  losartan (COZAAR) 25 MG tablet, Take 1 tablet (25 mg total) by mouth daily., Disp: 90 tablet, Rfl: 1 ?  magnesium oxide (MAG-OX) 400 (241.3 Mg) MG tablet, Take 1 tablet (400 mg total) by mouth 2 (two) times daily., Disp: 30 tablet, Rfl: 0 ?  nicotine (NICODERM CQ - DOSED IN MG/24 HOURS) 14 mg/24hr patch, Place 14 mg onto the skin daily., Disp: , Rfl:  ?  rosuvastatin (CRESTOR) 5 MG tablet, TAKE 1 TABLET EVERY DAY, Disp: 90 tablet, Rfl: 3 ?  tamsulosin (FLOMAX) 0.4 MG CAPS capsule, Take 0.4 mg by mouth daily. , Disp: , Rfl:   ?  ?Radiology:  ? ?CT Chest Lung 04/07/2021: ?1. Interval AVR and ascending thoracic aortic graft repair  without ?apparent complication. ?2. 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 Recommend ?annual imaging followup by CTA or MRA. This recommendation follows ?2010 ACCF/AHA/AATS/ACR/ASA/SCA/SCAI/SIR/STS/SVM

## 2022-02-25 ENCOUNTER — Other Ambulatory Visit: Payer: Self-pay | Admitting: Cardiology

## 2022-02-25 DIAGNOSIS — E7800 Pure hypercholesterolemia, unspecified: Secondary | ICD-10-CM

## 2022-05-15 ENCOUNTER — Other Ambulatory Visit: Payer: Self-pay

## 2022-05-15 ENCOUNTER — Telehealth: Payer: Self-pay | Admitting: Cardiology

## 2022-05-15 DIAGNOSIS — I10 Essential (primary) hypertension: Secondary | ICD-10-CM

## 2022-05-15 DIAGNOSIS — Z8679 Personal history of other diseases of the circulatory system: Secondary | ICD-10-CM

## 2022-05-15 MED ORDER — LOSARTAN POTASSIUM 25 MG PO TABS: 25.0000 mg | ORAL_TABLET | Freq: Every day | ORAL | 1 refills | Status: DC

## 2022-05-15 NOTE — Telephone Encounter (Signed)
Ok done it was send to mail order

## 2022-05-15 NOTE — Telephone Encounter (Signed)
Patient says he needs refill on BP meds. Appt is w/JG on 06/21/22.

## 2022-06-21 ENCOUNTER — Encounter: Payer: Self-pay | Admitting: Cardiology

## 2022-06-21 ENCOUNTER — Ambulatory Visit: Payer: Medicare HMO | Admitting: Cardiology

## 2022-06-21 VITALS — BP 123/69 | HR 56 | Ht 68.0 in | Wt 149.4 lb

## 2022-06-21 DIAGNOSIS — E7800 Pure hypercholesterolemia, unspecified: Secondary | ICD-10-CM

## 2022-06-21 DIAGNOSIS — E559 Vitamin D deficiency, unspecified: Secondary | ICD-10-CM

## 2022-06-21 DIAGNOSIS — I10 Essential (primary) hypertension: Secondary | ICD-10-CM

## 2022-06-21 DIAGNOSIS — Z720 Tobacco use: Secondary | ICD-10-CM

## 2022-06-21 DIAGNOSIS — Z8679 Personal history of other diseases of the circulatory system: Secondary | ICD-10-CM

## 2022-06-21 DIAGNOSIS — Z953 Presence of xenogenic heart valve: Secondary | ICD-10-CM

## 2022-06-21 DIAGNOSIS — T8203XD Leakage of heart valve prosthesis, subsequent encounter: Secondary | ICD-10-CM

## 2022-06-21 MED ORDER — VITAMIN D3 1.25 MG (50000 UT) PO CAPS: 50000.0000 [IU] | ORAL_CAPSULE | ORAL | 0 refills | Status: DC

## 2022-06-21 MED ORDER — LOSARTAN POTASSIUM 25 MG PO TABS: 25.0000 mg | ORAL_TABLET | Freq: Every day | ORAL | 3 refills | Status: DC

## 2022-06-21 MED ORDER — VITAMIN D3 75 MCG (3000 UT) PO TABS: 1.0000 | ORAL_TABLET | Freq: Every day | ORAL | 3 refills | Status: AC

## 2022-06-21 NOTE — Progress Notes (Signed)
[ Primary Physician/Referring:  Patient, No Pcp Per  Patient ID: Kurt Allen, male    DOB: 06/08/1946, 76 y.o.   MRN: [id number]  No chief complaint on file.  HPI:    Kurt Allen  is a 76 y.o. Caucasian male with hypertension, tobacco use disorder, severe aortic valve stenosis confirmed by cardiac catheterization on 02/17/2020 without significant coronary artery disease, a 5.1 cm ascending aortic aneurysm noted during low-dose CT scan of the chest for lung cancer screening, history of bioprosthetic aortic valve replacement along with aortic root repair on 04/14/2020, this is a 6-monthoffice visit.    He still smoking about 5 to 6 cigarettes a day. Except for chronic dyspnea denies palpitations, pain, claudication.   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/2020   25 mm Edwards Konect Resilia stented bovine pericardial valve synthetic root conduit with reimplantation of left main and right coronary arteries   Severe aortic stenosis    Thoracic ascending aortic aneurysm (H[facility])    Tobacco abuse    Past Surgical History:  Procedure Laterality Date   AORTIC VALVE REPLACEMENT N/A 04/14/2020   Procedure: AORTIC VALVE REPLACEMENT (AVR);  Surgeon: ORexene Alberts MD;  [place] [facility]  Service: Open Heart Surgery;  Laterality: N/A;   BENTALL PROCEDURE N/A 04/14/2020   Procedure: BENTALL AORTIC ROOT REPLACEMENT USING KONECT RESILIA 25MM AORTIC VALVED CONDUIT WITH REIMPLANTATION OF RIGHT AND LEFT CORONARY BUTTONS;  Surgeon: ORexene Alberts MD;  [place] [facility]  Service: Open Heart Surgery;  Laterality: N/A;   CARDIAC CATHETERIZATION     EYE SURGERY Bilateral    cataracts   RIGHT HEART CATH AND CORONARY ANGIOGRAPHY N/A 02/17/2020   Procedure: RIGHT HEART CATH AND CORONARY ANGIOGRAPHY;   Surgeon: GAdrian Prows MD;  [facility];  Service: Cardiovascular;  Laterality: N/A;   TEE WITHOUT CARDIOVERSION N/A 04/14/2020   Procedure: TRANSESOPHAGEAL ECHOCARDIOGRAM (TEE);  Surgeon: ORexene Alberts MD;  [place] [facility]  Service: Open Heart Surgery;  Laterality: N/A;   THORACIC AORTIC ANEURYSM REPAIR N/A 04/14/2020   Procedure: THORACIC ASCENDING ANEURYSM REPAIR (AAA);  Surgeon: ORexene Alberts MD;  [place] [facility]  Service: Open Heart Surgery;  Laterality: N/A;   History reviewed. No pertinent family history.  Social History   Tobacco Use   Smoking status: Some Days    Packs/day: 2.00    Years: 50.00    Total pack years: 100.00    Types: Cigarettes   Smokeless tobacco: Never   Tobacco comments:    smokes 1 pack or less in a week.  Does not smoke on a daily basis.  Substance Use Topics   Alcohol use: Yes    Alcohol/week: 10.0 standard drinks of alcohol    Types: 10 Cans of beer per week    Comment: per week   Marital Status: Divorced  ROS  Review of Systems  Cardiovascular:  Positive for dyspnea on exertion (Chronic and stable). Negative for chest pain and leg swelling.  Gastrointestinal:  Negative for melena.   Objective  Blood pressure 123/69, pulse (!) 56, height _0  (1.727 m), weight 149 lb 6.4 oz (67.8 kg), SpO2 97 %.     06/21/2022   12:51 PM 12/19/2021    1:36 PM 11/10/2021    2:15 PM  Vitals with BMI  Height _0  _1  _2   Weight 149 lbs 6 oz 150 lbs 152 lbs 3 oz  BMI 22.72 99.37 16.96  Systolic 789 381 017  Diastolic 69 71 70  Pulse 56 98 78     Physical Exam Constitutional:      Appearance: He is well-developed.  Neck:     Vascular: Carotid bruit (Bilateral) present. No JVD.  Cardiovascular:     Rate and Rhythm: Bradycardia present.     Pulses:          Carotid pulses are  on the left side with bruit.      Femoral pulses are 2+ on the right side.      Popliteal pulses are 2+ on the right side and 2+ on the left side.        Dorsalis pedis pulses are 1+ on the right side and 2+ on the left side.       Posterior tibial pulses are 2+ on the right side and 2+ on the left side.     Heart sounds: Murmur heard.     Early systolic murmur is present with a grade of 3/6 at the upper right sternal border.     No gallop.  Pulmonary:     Effort: Pulmonary effort is normal.     Breath sounds: Decreased breath sounds (barrel shaped chest with emphysema) present.  Abdominal:     General: Bowel sounds are normal.     Palpations: Abdomen is soft.  Musculoskeletal:     Right lower leg: No edema.     Left lower leg: No edema.    Laboratory examination:   Lab Results  Component Value Date   NA 140 06/29/2021   K 4.3 06/29/2021   CO2 26 06/29/2021   GLUCOSE 88 06/29/2021   BUN 9 06/29/2021   CREATININE 1.02 06/29/2021   CALCIUM 9.5 06/29/2021   EGFR 77 06/29/2021   GFRNONAA >60 04/20/2020       Latest Ref Rng & Units 06/29/2021   10:16 AM 02/09/2021    2:24 PM 04/20/2020    1:40 AM  CMP  Glucose 70 - 99 mg/dL 88  122  113   BUN 8 - 27 mg/dL _3 Creatinine 0.76 - 1.27 mg/dL 1.02  1.14  1.03   Sodium 134 - 144 mmol/L 140  136  132   Potassium 3.5 - 5.2 mmol/L 4.3  4.7  3.7   Chloride 96 - 106 mmol/L 100  98  92   CO2 20 - 29 mmol/L _4 Calcium 8.6 - 10.2 mg/dL 9.5  9.1  8.5   Total Protein 6.0 - 8.5 g/dL 6.6     Total Bilirubin 0.0 - 1.2 mg/dL 0.5     Alkaline Phos 44 - 121 IU/L 72     AST 0 - 40 IU/L 13     ALT 0 - 44 IU/L 14         Latest Ref Rng & Units 06/29/2021   10:16 AM 04/19/2020    1:58 AM 04/18/2020    4:47 AM  CBC  WBC 3.4 - 10.8 x10E3/uL 6.5  4.8  5.0   Hemoglobin 13.0 - 17.7 g/dL 16.7  10.4  9.9   Hematocrit 37.5 - 51.0 % 48.3  31.2  29.3   Platelets 150 - 450 x10E3/uL 269  196  163    Lipid Panel  Component Value Date/Time   CHOL 153 06/29/2021 1016   TRIG 82 06/29/2021 1016   HDL 70 06/29/2021 1016   LDLCALC 68 06/29/2021 1016   LDLDIRECT 63 06/29/2021 1016    HEMOGLOBIN A1C Lab Results  Component Value Date   HGBA1C 5.3 04/12/2020   MPG 105 04/12/2020   TSH No results for input(s): "TSH" in the last 8760 hours.  Medications and allergies   No Known Allergies   Current Outpatient Medications:    acetaminophen (TYLENOL) 325 MG tablet, Take 2 tablets (650 mg total) by mouth every 6 (six) hours as needed for mild pain., Disp: , Rfl:    albuterol (VENTOLIN HFA) 108 (90 Base) MCG/ACT inhaler, Inhale 1 puff into the lungs every 4 (four) hours as needed for wheezing or shortness of breath., Disp: 18 g, Rfl: 5   aspirin EC 81 MG EC tablet, Take 1 tablet (81 mg total) by mouth daily. Swallow whole., Disp: , Rfl:    Cholecalciferol (VITAMIN D3) 75 MCG (3000 UT) TABS, Take 1 tablet by mouth daily., Disp: 90 tablet, Rfl: 3   fluticasone (FLONASE) 50 MCG/ACT nasal spray, Place 1 spray into both nostrils daily as needed for rhinitis., Disp: , Rfl:    magnesium oxide (MAG-OX) 400 (241.3 Mg) MG tablet, Take 1 tablet (400 mg total) by mouth 2 (two) times daily., Disp: 30 tablet, Rfl: 0   Multiple Vitamin (MULTIVITAMIN) tablet, Take 1 tablet by mouth daily., Disp: , Rfl:    rosuvastatin (CRESTOR) 5 MG tablet, TAKE 1 TABLET EVERY DAY, Disp: 90 tablet, Rfl: 3   tamsulosin (FLOMAX) 0.4 MG CAPS capsule, Take 0.4 mg by mouth daily. , Disp: , Rfl:    Cholecalciferol (VITAMIN D3) 1.25 MG (50000 UT) CAPS, Take 50,000 Units by mouth once a week., Disp: 6 capsule, Rfl: 0   losartan (COZAAR) 25 MG tablet, Take 1 tablet (25 mg total) by mouth daily., Disp: 90 tablet, Rfl: 3    Radiology:   CT Chest Lung 04/07/2021: 1. Interval AVR and ascending thoracic aortic graft repair without apparent complication. 2. 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 Recommend annual imaging followup by CTA or MRA. This recommendation follows 2010 ACCF/AHA/AATS/ACR/ASA/SCA/SCAI/SIR/STS/SVM Guidelines for the Diagnosis and Management of Patients with Thoracic Aortic  Disease. Circulation.2010; 121: Z001-V494 3. 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 No new lesions  Cardiac Studies:   Abdominal Aortic Duplex  01/13/2020:  The maximum aorta (sac) diameter is 2.22 cm (mid). Mild ectasia noted.  Diffuse plaque observed in the proximal, mid and distal aorta. Normal flow  velocities noted in the aorta and iliac arteries.  No AAA noted. Consider recheck in 10 years for progression to AAA.  ABI 02/12/2020:  This exam reveals normal perfusion of the lower extremity (Bilateral ABI  1.00) with normal triphasic waveform pattern.   Left and right heart catheterization 02/17/2020: Normal coronary arteries with right dominant circulation. Normal right heart cardiac catheterization, preserved cardiac output and cardiac index at 4.22 and 2.29 respectively.  CT surgery 04/14/2020: ? Sievers type I bicuspid aortic valve with severe aortic stenosis ? Fusiform aneurysmal dilitation of the aortic root and entire ascending thoracic aorta ? Normal left ventricular systolic function.  Procedure:       ? Biological Bentall Aortic Root Replacement             Edwards Konect Resilia stented bovine pericardial tissue valved conduit (size 25 mm, model # [id number], serial # [id number])  Reimplantation of left main and right coronary artery             Replacement of ascending thoracic aortic aneurysm   Carotid artery duplex 12/09/2020: Duplex suggests stenosis in the right and left internal carotid artery (1-15%) with mild heterogeneous plaque. Antegrade right vertebral artery flow. Antegrade left vertebral artery flow. Compared to the study done on 01/13/2020, no significant change. Further studies when clinically indicated.  Echocardiogram 08/31/2020: Left ventricle cavity is normal in size and wall thickness. Normal global wall motion. Normal LV systolic function with EF 57%. Normal diastolic filling pattern. S/p 25 mm bioprosthetic aortic valve  replacement ne since previous transthoracic study 01/13/2020. Valve is well seated without any rocking motion. mean PG 7 mmHg, likely normal. Mild to moderate aortic regurgitation, new since intra-op TEE 04/14/2020. No evidence of pulmonary hypertension.  EKG   EKG 06/21/2022: Sinus bradycardia at rate of 50 bpm otherwise normal EKG.  Compared to 12/19/2021, heart rate was 84 bpm.  Assessment     ICD-10-CM   1. Perivalvular leak of prosthetic heart valve, subsequent encounter  T82.03XD PCV ECHOCARDIOGRAM COMPLETE    2. 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 Hypovitaminosis D  E55.9 Cholecalciferol (VITAMIN D3) 1.25 MG (50000 UT) CAPS    Cholecalciferol (VITAMIN D3) 75 MCG (3000 UT) TABS    4. Tobacco abuse  Z72.0     5. Primary hypertension  I10 losartan (COZAAR) 25 MG tablet    6. S/P ascending aortic aneurysm repair  Z98.890 losartan (COZAAR) 25 MG tablet   Z86.79     7. Pure hypercholesterolemia  E78.00       Meds ordered this encounter  Medications   Cholecalciferol (VITAMIN D3) 1.25 MG (50000 UT) CAPS    Sig: Take 50,000 Units by mouth once a week.    Dispense:  6 capsule    Refill:  0   Cholecalciferol (VITAMIN D3) 75 MCG (3000 UT) TABS    Sig: Take 1 tablet by mouth daily.    Dispense:  90 tablet    Refill:  3   losartan (COZAAR) 25 MG tablet    Sig: Take 1 tablet (25 mg total) by mouth daily.    Dispense:  90 tablet    Refill:  3     Medications Discontinued During This Encounter  Medication Reason   Cholecalciferol (VITAMIN D3) 1.25 MG (50000 UT) CAPS    nicotine (NICODERM CQ - DOSED IN MG/24 HOURS) 14 mg/24hr patch Patient Preference   losartan (COZAAR) 25 MG tablet Reorder    Recommendations   Kurt Allen  is a  76 y.o. Caucasian male with hypertension, tobacco use disorder, severe aortic valve stenosis confirmed by cardiac catheterization on 02/17/2020 without  significant coronary artery disease, a 5.1 cm ascending aortic aneurysm noted during low-dose CT scan of the chest for lung cancer screening, history of bioprosthetic aortic valve replacement along with aortic root repair on 04/14/2020.  He remains asymptomatic, still smokes about 2 to 4 cigarettes a day, physical examination reveals excellent pedal pulses, no change in his vascular exam or his cardiac exam.  EKG was normal sinus rhythm.  No clinical evidence of heart failure.  Lipids are well controlled, blood pressure is also well controlled on low-dose of statin and [place].  Continue the same for now.  I would like to see him back in 6 months for follow-up.  1.  Perivalvular leak of prosthetic heart valve, subsequent encounter Patient echocardiogram and revealed mild to moderate aortic regurgitation, would like to rescan echocardiogram to follow-up on this as it has been >1-year.  Patient is aware he needs endocarditis prophylaxis.  Previously had a very soft systolic murmur, no the systolic murmur appears to be much more prominent.  2. 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  No chest pain.  3. Hypovitaminosis D Patient had discontinued taking vitamin D, he was severely deficient, I will represcribe 50,000 units for 6 weeks followed by 3000 units on a daily basis.  He is establishing with a new PCP and we will obtain vitamin D levels at a later date.  Otherwise I will check his vitamin D on his next office visit in 6 months.  4. Tobacco abuse Patient is having a hard time quitting smoking, presently smoking about 4 to 5 cigarettes a day.  Reinforced abstinence again.  Otherwise patient remains stable, no clinical evidence of heart failure, vascular examination is normal, although does have carotid bruit, carotid duplex reveals no significant disease.  He is on a low-dose of a statin, lipids are well  controlled.  I will see him back in 6 months.   Adrian Prows, MD, [facility] 06/21/2022, 1:53 PM Office: [phone]

## 2022-06-25 IMAGING — CT CT ANGIO CHEST
2 of 18 series · 14 of 37 positions shown · IV contrast (omnipaque)
Comparison: Chest CT 12/25/2019.

CLINICAL DATA: 74-year-old male with history of severe aortic
stenosis. Preprocedural study prior to potential transcatheter
aortic valve replacement (TAVR) procedure.

EXAM:
CT ANGIOGRAPHY CHEST, ABDOMEN AND PELVIS
TECHNIQUE: Multidetector CT imaging through the chest, abdomen and pelvis was
performed using the standard protocol during bolus administration of
intravenous contrast. Multiplanar reconstructed images and MIPs were
obtained and reviewed to evaluate the vascular anatomy.
CONTRAST:  100mL OMNIPAQUE IOHEXOL 350 MG/ML SOLN

[Series 9: 0-90% · axial · 0.39mm/px · z∈[+1238,+1396]mm · 7 of 3500 slices shown]
[im 438/3500  lung]
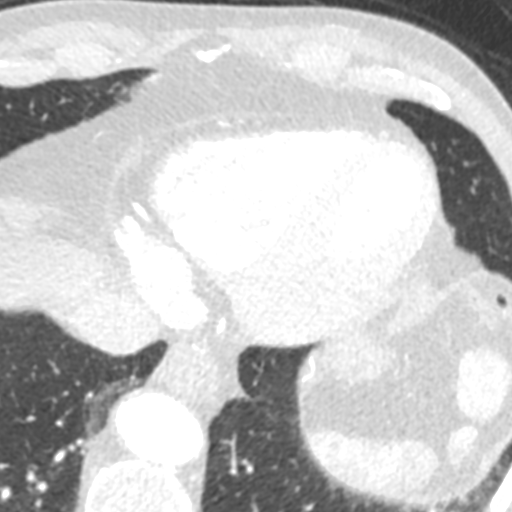
[im 875/3500  mediastinal]
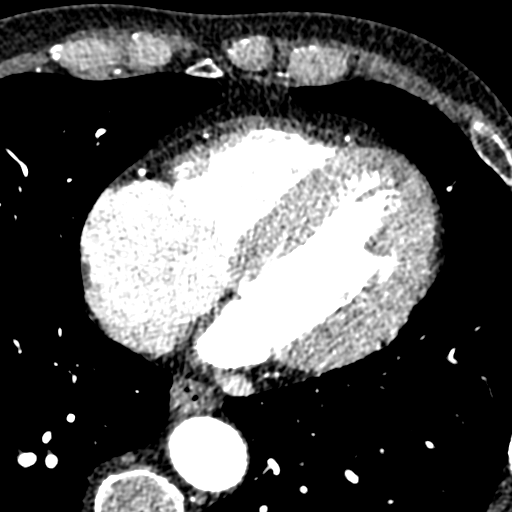
[im 1313/3500  lung]
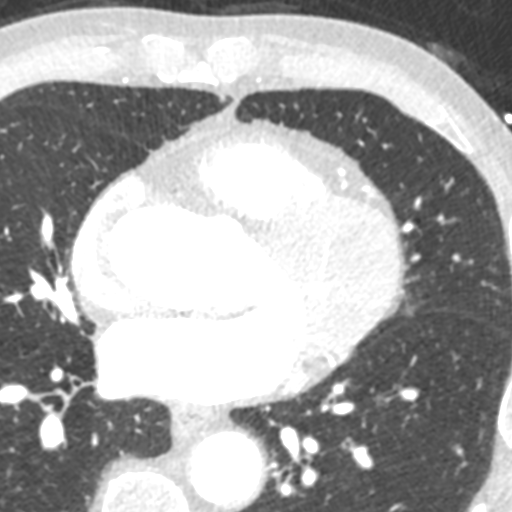
[im 1750/3500  mediastinal]
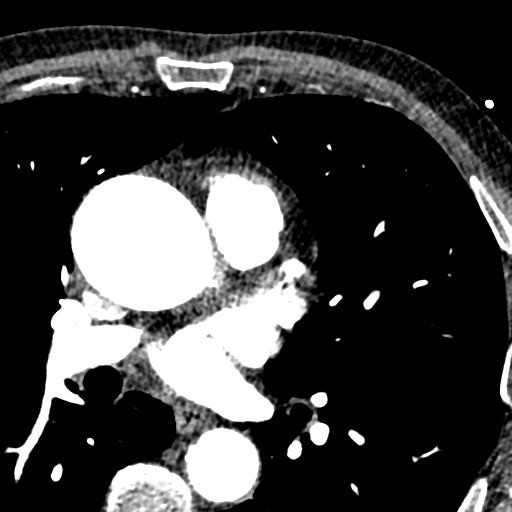
[im 2187/3500  lung]
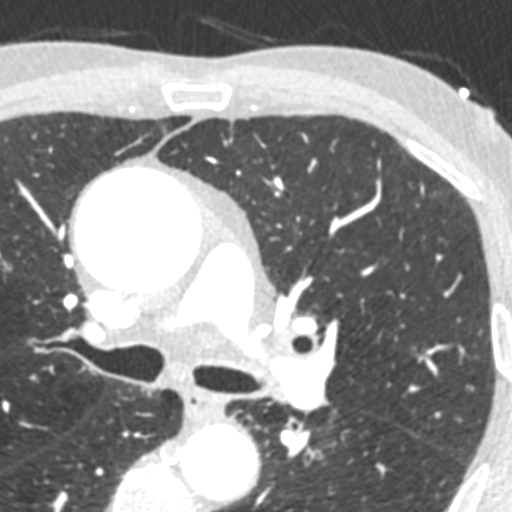
[im 2625/3500  mediastinal]
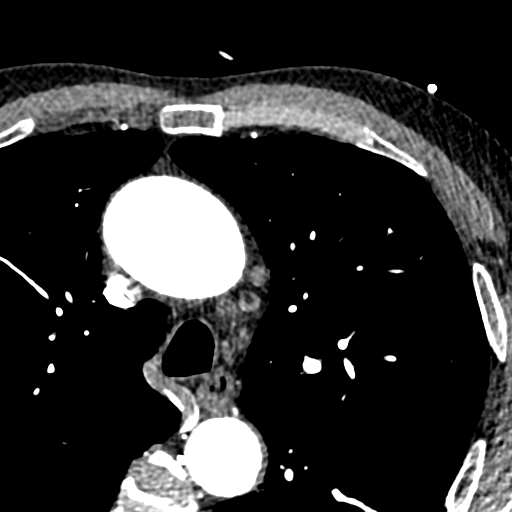
[im 3062/3500  lung]
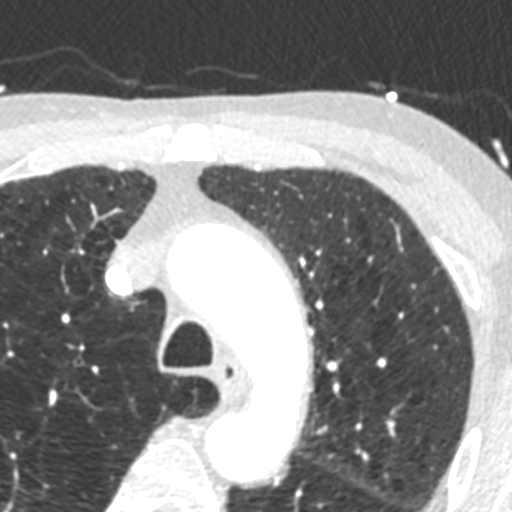

[Series 10: 5-95% · axial · 0.39mm/px · z∈[+1238,+1396]mm · 7 of 3500 slices shown]
[im 438/3500  lung]
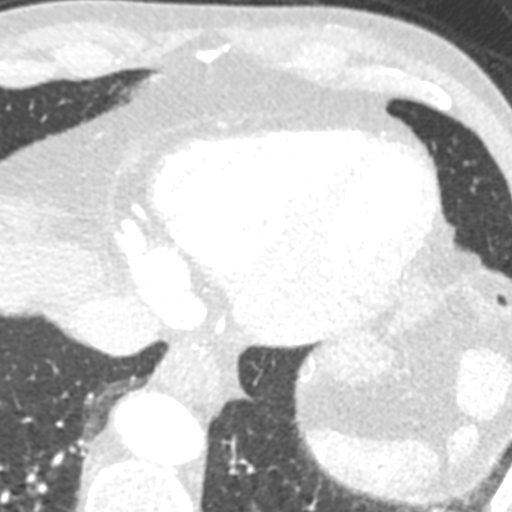
[im 875/3500  lung]
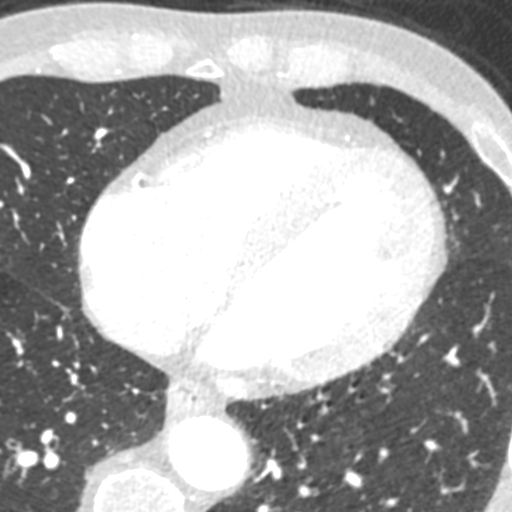
[im 1313/3500  lung]
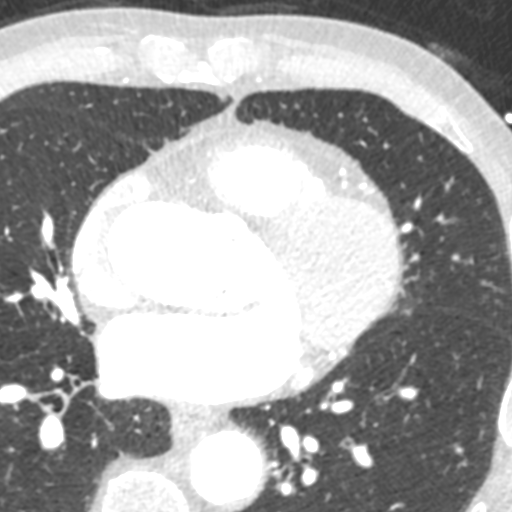
[im 1750/3500  lung]
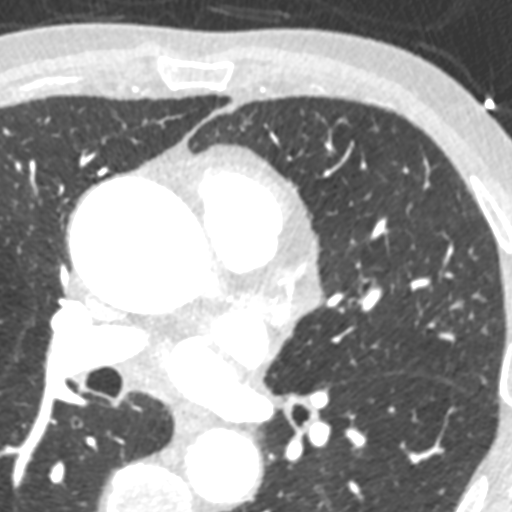
[im 2187/3500  lung]
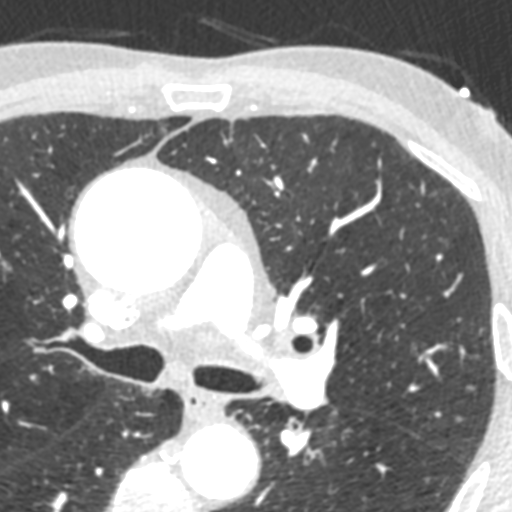
[im 2625/3500  lung]
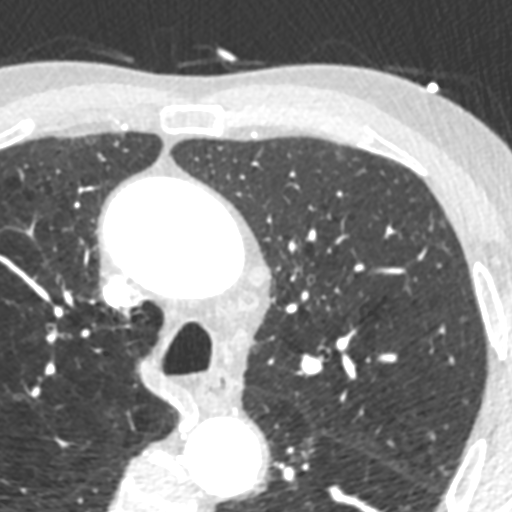
[im 3062/3500  lung]
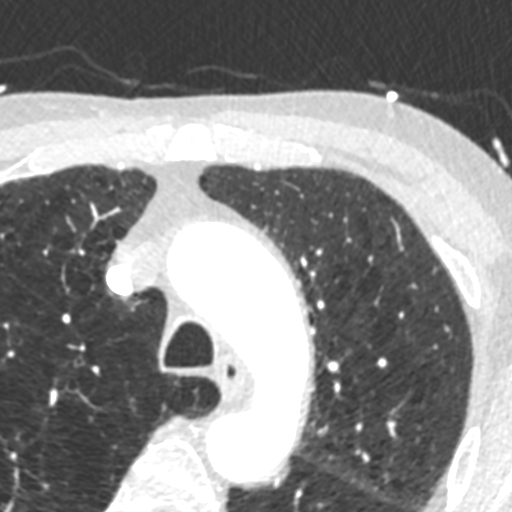

[14 of 37 positions shown; findings below may reference images not displayed]

FINDINGS: CTA CHEST FINDINGS

Cardiovascular: Heart size is normal. There is no significant
pericardial fluid, thickening or pericardial calcification. There is
aortic atherosclerosis, as well as atherosclerosis of the great
vessels of the mediastinum and the coronary arteries, including
calcified atherosclerotic plaque in the left main, left anterior
descending and right coronary arteries. Severe thickening and
calcification of the aortic valve. Aneurysmal dilatation of the
ascending thoracic aorta which measures up to 5.1 cm in diameter.

Mediastinum/Lymph Nodes: No pathologically enlarged mediastinal or
hilar lymph nodes. Esophagus is unremarkable in appearance. No
axillary lymphadenopathy.

Lungs/Pleura: No suspicious appearing pulmonary nodules or masses
are noted. No acute consolidative airspace disease. No pleural
effusions. Diffuse bronchial wall thickening with moderate
centrilobular and paraseptal emphysema.

Musculoskeletal/Soft Tissues: There are no aggressive appearing
lytic or blastic lesions noted in the visualized portions of the
skeleton.

CTA ABDOMEN AND PELVIS FINDINGS

Hepatobiliary: No suspicious cystic or solid hepatic lesions. No
intra or extrahepatic biliary ductal dilatation. Gallbladder is
normal in appearance.

Pancreas: No pancreatic mass. No pancreatic ductal dilatation. No
pancreatic or peripancreatic fluid collections or inflammatory
changes.

Spleen: Unremarkable.

Adrenals/Urinary Tract: Bilateral adrenal nodules measuring 1.9 x
1.3 cm on the right and 3.1 x 1.5 cm on the left, previously
characterized as adenomas. Bilateral kidneys are normal in
appearance. No hydroureteronephrosis. Urinary bladder is normal in
appearance.

Stomach/Bowel: Normal appearance of the stomach. No pathologic
dilatation of small bowel or colon. Normal appendix.

Vascular/Lymphatic: Aortic atherosclerosis, with vascular findings
and measurements pertinent to potential TAVR procedure, as detailed
below. Mild aneurysmal dilatation of the left common iliac artery
which measures up to 1.5 cm in diameter. There are no aggressive
appearing lytic or blastic lesions noted in the visualized portions
of the skeleton.

Reproductive: Prostate gland and seminal vesicles are unremarkable
in appearance.

Other: No significant volume of ascites.  No pneumoperitoneum.

Musculoskeletal: There are no aggressive appearing lytic or blastic
lesions noted in the visualized portions of the skeleton.

VASCULAR MEASUREMENTS PERTINENT TO TAVR:

AORTA:

Minimal Aortic Liameter-33 x 11 mm

Severity of Aortic Calcification-moderate to severe

RIGHT PELVIS:

Right Common Iliac Artery -

Minimal Miameter-P.K x 7.8 mm

Tortuosity-mild

Calcification-moderate

Right External Iliac Artery -

Minimal Miameter-P.K x 4.6 mm

Tortuosity-moderate

Calcification-mild

Right Common Femoral Artery -

Minimal Ciameter-R.C x 5.6 mm

Tortuosity-mild

Calcification-moderate

LEFT PELVIS:

Left Common Iliac Artery -

Minimal Jiameter-K.2 x 6.1 mm

Tortuosity-mild

Calcification-moderate

Left External Iliac Artery -

Minimal 4iameter-Z.F x 4.9 mm

Tortuosity-moderate

Calcification-mild

Left Common Femoral Artery -

Minimal Miameter-6.E x 6.5 mm

Tortuosity-mild

Calcification-mild-to-moderate

Review of the MIP images confirms the above findings.
IMPRESSION: 1. Vascular findings and measurements pertinent to potential TAVR
procedure, as detailed above.
2. Severe thickening and calcification of the aortic valve,
compatible with the reported clinical history of severe aortic
stenosis.
3. Aortic atherosclerosis, in addition to left main and 2 vessel
coronary artery disease. Assessment for potential risk factor
modification, dietary therapy or pharmacologic therapy may be
warranted, if clinically indicated.
4. Aneurysmal dilatation of the ascending thoracic aorta (5.1 cm in
diameter). Ascending thoracic aortic aneurysm. Recommend semi-annual
imaging followup by CTA or MRA and referral to cardiothoracic
surgery if not already obtained. This recommendation follows 7747
ACCF/AHA/AATS/ACR/ASA/SCA/DORRELIAN/TOYAMA/KOZHIR/KATHLEEN Guidelines for the
Diagnosis and Management of Patients With Thoracic Aortic Disease.
Circulation. 7747; 121: E266-e369. Aortic aneurysm NOS
(W1AFG-20E.R).
5. Diffuse bronchial wall thickening with moderate centrilobular and
paraseptal emphysema; imaging findings suggestive of underlying
COPD.
6. Bilateral adrenal adenomas redemonstrated.
7. Additional incidental findings, as above.

## 2022-06-25 IMAGING — CT CT CTA ABD/PEL W/CM AND/OR W/O CM
1 of 5 series · 1 of 32 positions shown · IV contrast (omnipaque)
Comparison: Chest CT 12/25/2019.

CLINICAL DATA: 74-year-old male with history of severe aortic
stenosis. Preprocedural study prior to potential transcatheter
aortic valve replacement (TAVR) procedure.

EXAM:
CT ANGIOGRAPHY CHEST, ABDOMEN AND PELVIS
TECHNIQUE: Multidetector CT imaging through the chest, abdomen and pelvis was
performed using the standard protocol during bolus administration of
intravenous contrast. Multiplanar reconstructed images and MIPs were
obtained and reviewed to evaluate the vascular anatomy.
CONTRAST:  100mL OMNIPAQUE IOHEXOL 350 MG/ML SOLN

[Series 519: — · 0.31mm/px · 1 of 7 slices shown]
[im 4/7]
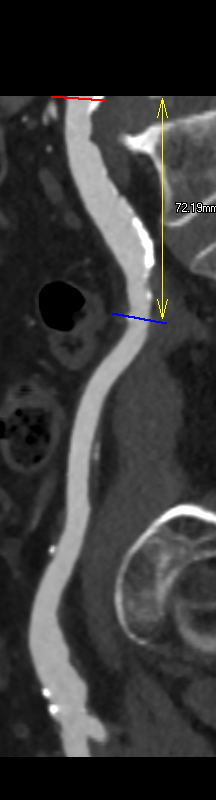

[1 of 32 positions shown; findings below may reference images not displayed]

FINDINGS: CTA CHEST FINDINGS

Cardiovascular: Heart size is normal. There is no significant
pericardial fluid, thickening or pericardial calcification. There is
aortic atherosclerosis, as well as atherosclerosis of the great
vessels of the mediastinum and the coronary arteries, including
calcified atherosclerotic plaque in the left main, left anterior
descending and right coronary arteries. Severe thickening and
calcification of the aortic valve. Aneurysmal dilatation of the
ascending thoracic aorta which measures up to 5.1 cm in diameter.

Mediastinum/Lymph Nodes: No pathologically enlarged mediastinal or
hilar lymph nodes. Esophagus is unremarkable in appearance. No
axillary lymphadenopathy.

Lungs/Pleura: No suspicious appearing pulmonary nodules or masses
are noted. No acute consolidative airspace disease. No pleural
effusions. Diffuse bronchial wall thickening with moderate
centrilobular and paraseptal emphysema.

Musculoskeletal/Soft Tissues: There are no aggressive appearing
lytic or blastic lesions noted in the visualized portions of the
skeleton.

CTA ABDOMEN AND PELVIS FINDINGS

Hepatobiliary: No suspicious cystic or solid hepatic lesions. No
intra or extrahepatic biliary ductal dilatation. Gallbladder is
normal in appearance.

Pancreas: No pancreatic mass. No pancreatic ductal dilatation. No
pancreatic or peripancreatic fluid collections or inflammatory
changes.

Spleen: Unremarkable.

Adrenals/Urinary Tract: Bilateral adrenal nodules measuring 1.9 x
1.3 cm on the right and 3.1 x 1.5 cm on the left, previously
characterized as adenomas. Bilateral kidneys are normal in
appearance. No hydroureteronephrosis. Urinary bladder is normal in
appearance.

Stomach/Bowel: Normal appearance of the stomach. No pathologic
dilatation of small bowel or colon. Normal appendix.

Vascular/Lymphatic: Aortic atherosclerosis, with vascular findings
and measurements pertinent to potential TAVR procedure, as detailed
below. Mild aneurysmal dilatation of the left common iliac artery
which measures up to 1.5 cm in diameter. There are no aggressive
appearing lytic or blastic lesions noted in the visualized portions
of the skeleton.

Reproductive: Prostate gland and seminal vesicles are unremarkable
in appearance.

Other: No significant volume of ascites.  No pneumoperitoneum.

Musculoskeletal: There are no aggressive appearing lytic or blastic
lesions noted in the visualized portions of the skeleton.

VASCULAR MEASUREMENTS PERTINENT TO TAVR:

AORTA:

Minimal Aortic Liameter-33 x 11 mm

Severity of Aortic Calcification-moderate to severe

RIGHT PELVIS:

Right Common Iliac Artery -

Minimal Miameter-P.K x 7.8 mm

Tortuosity-mild

Calcification-moderate

Right External Iliac Artery -

Minimal Miameter-P.K x 4.6 mm

Tortuosity-moderate

Calcification-mild

Right Common Femoral Artery -

Minimal Ciameter-R.C x 5.6 mm

Tortuosity-mild

Calcification-moderate

LEFT PELVIS:

Left Common Iliac Artery -

Minimal Jiameter-K.2 x 6.1 mm

Tortuosity-mild

Calcification-moderate

Left External Iliac Artery -

Minimal 4iameter-Z.F x 4.9 mm

Tortuosity-moderate

Calcification-mild

Left Common Femoral Artery -

Minimal Miameter-6.E x 6.5 mm

Tortuosity-mild

Calcification-mild-to-moderate

Review of the MIP images confirms the above findings.
IMPRESSION: 1. Vascular findings and measurements pertinent to potential TAVR
procedure, as detailed above.
2. Severe thickening and calcification of the aortic valve,
compatible with the reported clinical history of severe aortic
stenosis.
3. Aortic atherosclerosis, in addition to left main and 2 vessel
coronary artery disease. Assessment for potential risk factor
modification, dietary therapy or pharmacologic therapy may be
warranted, if clinically indicated.
4. Aneurysmal dilatation of the ascending thoracic aorta (5.1 cm in
diameter). Ascending thoracic aortic aneurysm. Recommend semi-annual
imaging followup by CTA or MRA and referral to cardiothoracic
surgery if not already obtained. This recommendation follows 7747
ACCF/AHA/AATS/ACR/ASA/SCA/DORRELIAN/TOYAMA/KOZHIR/KATHLEEN Guidelines for the
Diagnosis and Management of Patients With Thoracic Aortic Disease.
Circulation. 7747; 121: E266-e369. Aortic aneurysm NOS
(W1AFG-20E.R).
5. Diffuse bronchial wall thickening with moderate centrilobular and
paraseptal emphysema; imaging findings suggestive of underlying
COPD.
6. Bilateral adrenal adenomas redemonstrated.
7. Additional incidental findings, as above.

## 2022-07-26 ENCOUNTER — Other Ambulatory Visit (HOSPITAL_BASED_OUTPATIENT_CLINIC_OR_DEPARTMENT_OTHER): Payer: Self-pay

## 2022-07-26 ENCOUNTER — Telehealth: Payer: Self-pay | Admitting: Pulmonary Disease

## 2022-07-26 ENCOUNTER — Ambulatory Visit (INDEPENDENT_AMBULATORY_CARE_PROVIDER_SITE_OTHER): Payer: Medicare HMO | Admitting: Pulmonary Disease

## 2022-07-26 ENCOUNTER — Encounter (HOSPITAL_BASED_OUTPATIENT_CLINIC_OR_DEPARTMENT_OTHER): Payer: Self-pay | Admitting: Pulmonary Disease

## 2022-07-26 VITALS — BP 118/68 | HR 74 | Temp 98.2°F | Ht 68.0 in | Wt 149.8 lb

## 2022-07-26 DIAGNOSIS — Z720 Tobacco use: Secondary | ICD-10-CM | POA: Diagnosis not present

## 2022-07-26 DIAGNOSIS — I7122 Aneurysm of the aortic arch, without rupture: Secondary | ICD-10-CM

## 2022-07-26 DIAGNOSIS — J432 Centrilobular emphysema: Secondary | ICD-10-CM

## 2022-07-26 MED ORDER — ALBUTEROL SULFATE HFA 108 (90 BASE) MCG/ACT IN AERS: 1.0000 | INHALATION_SPRAY | RESPIRATORY_TRACT | 5 refills | Status: AC | PRN

## 2022-07-26 MED ORDER — FLUTICASONE PROPIONATE 50 MCG/ACT NA SUSP: 1.0000 | Freq: Every day | NASAL | 3 refills | Status: AC | PRN

## 2022-07-26 NOTE — Assessment & Plan Note (Signed)
Smoking cessation was again emphasized is the most important intervention  

## 2022-07-26 NOTE — Progress Notes (Signed)
   Subjective:    Patient ID: Kurt Allen, male    DOB: 01-08-1946, 76 y.o.   MRN: [id number]  HPI  76 yo smoker for FU of emphysema and lung nodule on low-dose CT.   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 No new issues since LOV    He continues to smoke 5 to 6 cigarettes/day. Uses albuterol once or twice a week. Previously he need not want maintenance medication so he is only maintained on albuterol. Reviewed cardiology office visit, he has a paravalvular leak and echocardiogram is planned. 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 Also denies presyncope, palpitations, heartburn, abdominal pain, nausea, vomiting, diarrhea or change in bowel or urinary habits, dysuria,hematuria, rash, arthralgias, visual complaints, headache, numbness weakness or ataxia.     Objective:   Physical Exam  Gen. Pleasant, well-nourished, in no distress ENT - no thrush, no pallor/icterus,no post nasal drip Neck: No JVD, no  thyromegaly, no carotid bruits Lungs: no use of accessory muscles, no dullness to percussion, clear without rales or rhonchi  Cardiovascular: Rhythm regular, heart sounds  normal, no murmurs or gallops, no peripheral edema Musculoskeletal: No deformities, no cyanosis or clubbing        Assessment & Plan:

## 2022-07-26 NOTE — Assessment & Plan Note (Signed)
Ascending aortic aneurysm has been repaired. 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

## 2022-07-26 NOTE — Telephone Encounter (Signed)
Patient is scheduled for 08/25/22 for CTA that was the soonest appt for GI. Please lmk if this is okay. Working on authorization now for his [employer] ins.

## 2022-07-26 NOTE — Assessment & Plan Note (Signed)
Appears stable. Once again he does not want maintenance inhaler so we will continue albuterol to use on an as-needed basis. Flu shot today. We discussed COPD action plan and signs and symptoms of COPD exacerbation

## 2022-07-26 NOTE — Patient Instructions (Signed)
xFlu shot today. xRefills on Flonase and albuterol. X CT angiogram for aortic arch aneurysm

## 2022-08-08 ENCOUNTER — Ambulatory Visit (HOSPITAL_COMMUNITY)
Admission: RE | Admit: 2022-08-08 | Discharge: 2022-08-08 | Disposition: A | Discharge status: Home / Self Care | Payer: Medicare HMO | Source: Ambulatory Visit | Attending: Pulmonary Disease | Admitting: Pulmonary Disease

## 2022-08-08 DIAGNOSIS — I7122 Aneurysm of the aortic arch, without rupture: Secondary | ICD-10-CM | POA: Diagnosis not present

## 2022-08-08 MED ORDER — IOHEXOL 350 MG/ML SOLN
75.0000 mL | Freq: Once | INTRAVENOUS | Status: AC | PRN
  Administered 2022-08-08: 75 mL via INTRAVENOUS

## 2022-08-10 ENCOUNTER — Telehealth: Payer: Self-pay

## 2022-08-10 DIAGNOSIS — I7122 Aneurysm of the aortic arch, without rupture: Secondary | ICD-10-CM

## 2022-08-10 DIAGNOSIS — J432 Centrilobular emphysema: Secondary | ICD-10-CM

## 2022-08-10 NOTE — Telephone Encounter (Signed)
Called Pt to give results of Angio CT. Pt stated understanding and an order was placed for another CT Scan in 6 months. Nothing further needed.

## 2022-08-10 NOTE — Telephone Encounter (Signed)
-----   Message from Rigoberto Noel, MD sent at 08/09/2022 12:58 PM EST ----- Aneurysm is unchanged in size. Tiny nodules 6 mm were noted, these are likely benign but would suggest follow-up CT scan in 6 months Please arrange for CT chest with contrast

## 2022-08-25 ENCOUNTER — Other Ambulatory Visit: Payer: Medicare HMO

## 2022-12-12 ENCOUNTER — Ambulatory Visit: Payer: Medicare HMO

## 2022-12-12 DIAGNOSIS — Z953 Presence of xenogenic heart valve: Secondary | ICD-10-CM

## 2022-12-12 DIAGNOSIS — T8203XD Leakage of heart valve prosthesis, subsequent encounter: Secondary | ICD-10-CM

## 2022-12-21 ENCOUNTER — Encounter: Payer: Self-pay | Admitting: Cardiology

## 2022-12-21 ENCOUNTER — Ambulatory Visit: Payer: Medicare HMO | Admitting: Cardiology

## 2022-12-21 VITALS — BP 123/74 | HR 86 | Resp 16 | Ht 68.0 in | Wt 146.6 lb

## 2022-12-21 DIAGNOSIS — Z953 Presence of xenogenic heart valve: Secondary | ICD-10-CM

## 2022-12-21 DIAGNOSIS — E7800 Pure hypercholesterolemia, unspecified: Secondary | ICD-10-CM

## 2022-12-21 DIAGNOSIS — I10 Essential (primary) hypertension: Secondary | ICD-10-CM

## 2022-12-21 DIAGNOSIS — F17200 Nicotine dependence, unspecified, uncomplicated: Secondary | ICD-10-CM

## 2022-12-21 DIAGNOSIS — T8203XD Leakage of heart valve prosthesis, subsequent encounter: Secondary | ICD-10-CM

## 2022-12-21 NOTE — Progress Notes (Signed)
[ Primary Physician/Referring:  Patient, No Pcp Per  Patient ID: Kurt Allen, male    DOB: 11/07/1945, 77 y.o.   MRN: [id number]  Chief Complaint  Patient presents with   Ascending aortic aneursym repair   Aortic regurgitation   Follow-up    6 months   HPI:    Kurt Allen  is a 77 y.o. Caucasian male with hypertension, tobacco use disorder and still smoking 5-10 cigarettes a day, severe aortic valve stenosis confirmed by cardiac catheterization on 02/17/2020 without significant coronary artery disease, a 5.1 cm ascending aortic aneurysm noted during low-dose CT scan of the chest for lung cancer screening, history of bioprosthetic aortic valve replacement along with aortic root repair on 04/14/2020.   He still smoking about 5 to 6 cigarettes a day. Except for chronic dyspnea denies palpitations, pain, claudication.   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/2020   25 mm Edwards Konect Resilia stented bovine pericardial valve synthetic root conduit with reimplantation of left main and right coronary arteries   Severe aortic stenosis    Thoracic ascending aortic aneurysm (HCC)    Tobacco abuse    Past Surgical History:  Procedure Laterality Date   AORTIC VALVE REPLACEMENT N/A 04/14/2020   Procedure: AORTIC VALVE REPLACEMENT (AVR);  Surgeon: Purcell Nails, MD;  Location: [facility] OR;  Service: Open Heart Surgery;  Laterality: N/A;   BENTALL PROCEDURE N/A 04/14/2020   Procedure: BENTALL AORTIC ROOT REPLACEMENT USING KONECT RESILIA AORTIC VALVED CONDUIT WITH REIMPLANTATION OF RIGHT AND LEFT CORONARY BUTTONS;  Surgeon: Purcell Nails, MD;  Location: [facility] OR;  Service: Open Heart Surgery;  Laterality: N/A;   CARDIAC CATHETERIZATION     EYE SURGERY Bilateral    cataracts   RIGHT HEART  CATH AND CORONARY ANGIOGRAPHY N/A 02/17/2020   Procedure: RIGHT HEART CATH AND CORONARY ANGIOGRAPHY;  Surgeon: Yates Decamp, MD;  Location: [facility] INVASIVE CV LAB;  Service: Cardiovascular;  Laterality: N/A;   TEE WITHOUT CARDIOVERSION N/A 04/14/2020   Procedure: TRANSESOPHAGEAL ECHOCARDIOGRAM (TEE);  Surgeon: Purcell Nails, MD;  Location: [facility] OR;  Service: Open Heart Surgery;  Laterality: N/A;   THORACIC AORTIC ANEURYSM REPAIR N/A 04/14/2020   Procedure: THORACIC ASCENDING ANEURYSM REPAIR (AAA);  Surgeon: Purcell Nails, MD;  Location: [facility] OR;  Service: Open Heart Surgery;  Laterality: N/A;   History reviewed. No pertinent family history.  Social History   Tobacco Use   Smoking status: Some Days    Packs/day: 0.50    Years: 50.00    Additional pack years: 0.00    Total pack years: 25.00    Types: Cigarettes    Passive exposure: Past   Smokeless tobacco: Never   Tobacco comments:    smokes 1/2 pack or less in a week.  As of 07/26/2022 LW  Substance Use Topics   Alcohol use: Yes    Alcohol/week: 10.0 standard drinks of alcohol    Types: 10 Cans of beer per week    Comment: per week   Marital Status: Divorced  ROS  Review of Systems  Cardiovascular:  Positive for dyspnea on exertion (Chronic and stable). Negative for chest pain and leg swelling.  Gastrointestinal:  Negative for melena.   Objective  Blood pressure 123/74, pulse 86, resp. rate 16, height 5\' 8"  (1.727 m),  weight 146 lb 9.6 oz (66.5 kg), SpO2 95 %.     12/21/2022    1:06 PM 07/26/2022    1:22 PM 06/21/2022   12:51 PM  Vitals with BMI  Height 5\' 8"  5\' 8"  5\' 8"   Weight 146 lbs 10 oz 149 lbs 13 oz 149 lbs 6 oz  BMI 22.3 22.78 22.72  Systolic 123 118 130  Diastolic 74 68 69  Pulse 86 74 56     Physical Exam Constitutional:      Appearance: He is well-developed.  Neck:     Vascular: Carotid bruit (Bilateral) present. No JVD.  Cardiovascular:     Rate and Rhythm: Bradycardia present.     Pulses: Normal pulses and  intact distal pulses.     Heart sounds: Murmur heard.     Harsh early systolic murmur is present with a grade of 3/6 at the upper right sternal border.     No gallop.  Pulmonary:     Effort: Pulmonary effort is normal.     Breath sounds: Decreased breath sounds (barrel shaped chest with emphysema) present.  Abdominal:     General: Bowel sounds are normal.     Palpations: Abdomen is soft.  Musculoskeletal:     Right lower leg: No edema.     Left lower leg: No edema.    Laboratory examination:   Lab Results  Component Value Date   NA 140 06/29/2021   K 4.3 06/29/2021   CO2 26 06/29/2021   GLUCOSE 88 06/29/2021   BUN 9 06/29/2021   CREATININE 1.02 06/29/2021   CALCIUM 9.5 06/29/2021   EGFR 77 06/29/2021   GFRNONAA >60 04/20/2020       Latest Ref Rng & Units 06/29/2021   10:16 AM 02/09/2021    2:24 PM 04/20/2020    1:40 AM  CMP  Glucose 70 - 99 mg/dL 88  865  784   BUN 8 - 27 mg/dL 9  12  12    Creatinine 0.76 - 1.27 mg/dL 6.96  2.95  2.84   Sodium 134 - 144 mmol/L 140  136  132   Potassium 3.5 - 5.2 mmol/L 4.3  4.7  3.7   Chloride 96 - 106 mmol/L 100  98  92   CO2 20 - 29 mmol/L 26  25  29    Calcium 8.6 - 10.2 mg/dL 9.5  9.1  8.5   Total Protein 6.0 - 8.5 g/dL 6.6     Total Bilirubin 0.0 - 1.2 mg/dL 0.5     Alkaline Phos 44 - 121 IU/L 72     AST 0 - 40 IU/L 13     ALT 0 - 44 IU/L 14         Latest Ref Rng & Units 06/29/2021   10:16 AM 04/19/2020    1:58 AM 04/18/2020    4:47 AM  CBC  WBC 3.4 - 10.8 x10E3/uL 6.5  4.8  5.0   Hemoglobin 13.0 - 17.7 g/dL 13.2  44.0  9.9   Hematocrit 37.5 - 51.0 % 48.3  31.2  29.3   Platelets 150 - 450 x10E3/uL 269  196  163    Lipid Panel     Component Value Date/Time   CHOL 153 06/29/2021 1016   TRIG 82 06/29/2021 1016   HDL 70 06/29/2021 1016   LDLCALC 68 06/29/2021 1016   LDLDIRECT 63 06/29/2021 1016   HEMOGLOBIN A1C Lab Results  Component Value Date   HGBA1C 5.3 04/12/2020  MPG 105 04/12/2020   No results found for:  "TSH"   Medications and allergies   Not on File   Current Outpatient Medications:    acetaminophen (TYLENOL) 325 MG tablet, Take 2 tablets (650 mg total) by mouth every 6 (six) hours as needed for mild pain., Disp: , Rfl:    albuterol (VENTOLIN HFA) 108 (90 Base) MCG/ACT inhaler, Inhale 1 puff into the lungs every 4 (four) hours as needed for wheezing or shortness of breath., Disp: 18 g, Rfl: 5   aspirin EC 81 MG EC tablet, Take 1 tablet (81 mg total) by mouth daily. Swallow whole., Disp: , Rfl:    Cholecalciferol (VITAMIN D3) 75 MCG (3000 UT) TABS, Take 1 tablet by mouth daily., Disp: 90 tablet, Rfl: 3   fluticasone (FLONASE) 50 MCG/ACT nasal spray, Place 1 spray into both nostrils daily as needed for rhinitis., Disp: 16 g, Rfl: 3   losartan (COZAAR) 25 MG tablet, Take 1 tablet (25 mg total) by mouth daily., Disp: 90 tablet, Rfl: 3   magnesium oxide (MAG-OX) 400 (241.3 Mg) MG tablet, Take 1 tablet (400 mg total) by mouth 2 (two) times daily., Disp: 30 tablet, Rfl: 0   Multiple Vitamin (MULTIVITAMIN) tablet, Take 1 tablet by mouth daily., Disp: , Rfl:    rosuvastatin (CRESTOR) 5 MG tablet, TAKE 1 TABLET EVERY DAY, Disp: 90 tablet, Rfl: 3   tamsulosin (FLOMAX) 0.4 MG CAPS capsule, Take 0.4 mg by mouth daily. , Disp: , Rfl:    Cholecalciferol (VITAMIN D3) 1.25 MG (50000 UT) CAPS, Take 50,000 Units by mouth once a week., Disp: 6 capsule, Rfl: 0    Radiology:   CT Chest Lung 04/07/2021: 1. Interval AVR and ascending thoracic aortic graft repair without apparent complication. 2. 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 Recommend annual imaging followup by CTA or MRA. This recommendation follows 2010 ACCF/AHA/AATS/ACR/ASA/SCA/SCAI/SIR/STS/SVM Guidelines for the Diagnosis and Management of Patients with Thoracic Aortic Disease. Circulation.2010; 121: O962-X528 3. 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 No new lesions  Cardiac Studies:   Abdominal Aortic Duplex   01/13/2020:  The maximum aorta (sac) diameter is 2.22 cm (mid). Mild ectasia noted.  Diffuse plaque observed in the proximal, mid and distal aorta. Normal flow  velocities noted in the aorta and iliac arteries.  No AAA noted. Consider recheck in 10 years for progression to AAA.  ABI 02/12/2020:  This exam reveals normal perfusion of the lower extremity (Bilateral ABI  1.00) with normal triphasic waveform pattern.   Left and right heart catheterization 02/17/2020: Normal coronary arteries with right dominant circulation. Normal right heart cardiac catheterization, preserved cardiac output and cardiac index at 4.22 and 2.29 respectively.  CT surgery 04/14/2020: ? Sievers type I bicuspid aortic valve with severe aortic stenosis ? Fusiform aneurysmal dilitation of the aortic root and entire ascending thoracic aorta ? Normal left ventricular systolic function.  Procedure:       ? Biological Bentall Aortic Root Replacement             Edwards Konect Resilia stented bovine pericardial tissue valved conduit (size 25 mm, model # [id number], serial # [id number])              Reimplantation of left main and right coronary artery             Replacement of ascending thoracic aortic aneurysm   Carotid artery duplex 12/09/2020: Duplex suggests stenosis in the right and left internal carotid artery (1-15%) with mild  heterogeneous plaque. Antegrade right vertebral artery flow. Antegrade left vertebral artery flow. Compared to the study done on 01/13/2020, no significant change. Further studies when clinically indicated.  PCV ECHOCARDIOGRAM COMPLETE 12/12/2022  Narrative Echocardiogram 12/12/2022: Normal LV systolic function with visual EF 60-65%. Left ventricle cavity is normal in size. Normal left ventricular wall thickness. Normal global wall motion. Normal diastolic filling pattern, normal LAP. Calculated EF 69%. Bioprosthetic trileaflet aortic valve.  Mild (Grade I) aortic regurgitation. Aortic root  and ascending aorta normal at 3.5 cm. Structurally normal tricuspid valve with trace regurgitation. No evidence of pulmonary hypertension. Compared to 08/31/2020, aortic regurgitation appears to be less prominent, previously mild to moderate.  EKG   EKG 12/21/2022: Normal sinus rhythm at the rate of 83 bpm, Rightward axis.  No evidence of ischemia.  No significant change from 06/21/2022.  Assessment     ICD-10-CM   1. Perivalvular leak of prosthetic heart valve, subsequent encounter  T82.03XD EKG 12-Lead    2. 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 Pure hypercholesterolemia  E78.00 CBC    Lipid Panel With LDL/HDL Ratio    CMP14+EGFR    Ambulatory referral to [facility]    4. Primary hypertension  I10 CBC    Lipid Panel With LDL/HDL Ratio    CMP14+EGFR    Ambulatory referral to [facility]    5. Tobacco use disorder  F17.200       No orders of the defined types were placed in this encounter.  Orders Placed This Encounter  Procedures   CBC   Lipid Panel With LDL/HDL Ratio   CMP14+EGFR   Ambulatory referral to Family Practice    Referral Priority:   Routine    Referral Type:   Consultation    Referral Reason:   Specialty Services Required    Referred to Provider:   Carlean Jews, NP    Requested Specialty:   Family Medicine    Number of Visits Requested:   1   EKG 12-Lead    No orders of the defined types were placed in this encounter.  There are no discontinued medications.   Recommendations   Kurt Allen  is a  77 y.o. Caucasian male with hypertension, tobacco use disorder and still smoking 5-10 cigarettes a day, severe aortic valve stenosis confirmed by cardiac catheterization on 02/17/2020 without significant coronary artery disease, a 5.1 cm ascending aortic aneurysm noted during low-dose CT scan of the chest for lung cancer screening, history of bioprosthetic aortic valve replacement along with aortic  root repair on 04/14/2020.  1. Perivalvular leak of prosthetic heart valve, subsequent encounter Patient's echocardiogram reveals very stable aortic valve repair/replacement, no significant perivalvular leak.  In fact the aortic regurgitation has improved.  He does need endocarditis prophylaxis. - EKG 12-Lead  2. 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  3. Pure hypercholesterolemia Lipids are well-controlled on minimal dose of statin, continue the same.  He has not had lab work done in [employer], will obtain routine labs. - CBC - Lipid Panel With LDL/HDL Ratio - CMP14+EGFR - Ambulatory referral to [facility]  4. Primary hypertension Blood pressure is well-controlled. - CBC - Lipid Panel With LDL/HDL Ratio - CMP14+EGFR - Ambulatory referral to [facility]  5. Tobacco Use Disorder Patient is having a hard time quitting smoking, presently smoking about 4 to 5 cigarettes a day.  Reinforced abstinence  again.  Otherwise patient remains stable, no clinical evidence of heart failure, vascular examination is normal, although does have carotid bruit, carotid duplex reveals no significant disease.  He is on a low-dose of a statin, lipids are well controlled.  I will see him back in 1 year.     Yates Decamp, MD, [facility] 12/21/2022, 1:28 PM Office: [phone]

## 2022-12-26 LAB — CBC
Hematocrit: 49.9 % (ref 37.5–51.0)
Hemoglobin: 16.5 g/dL (ref 13.0–17.7)
MCH: 30.3 pg (ref 26.6–33.0)
MCHC: 33.1 g/dL (ref 31.5–35.7)
MCV: 92 fL (ref 79–97)
Platelets: 249 10*3/uL (ref 150–450)
RBC: 5.44 x10E6/uL (ref 4.14–5.80)
RDW: 13 % (ref 11.6–15.4)
WBC: 7.5 10*3/uL (ref 3.4–10.8)

## 2022-12-26 LAB — CMP14+EGFR
ALT: 15 IU/L (ref 0–44)
AST: 15 IU/L (ref 0–40)
Albumin/Globulin Ratio: 2.6 — ABNORMAL HIGH (ref 1.2–2.2)
Albumin: 4.6 g/dL (ref 3.8–4.8)
Alkaline Phosphatase: 66 IU/L (ref 44–121)
BUN/Creatinine Ratio: 11 (ref 10–24)
BUN: 12 mg/dL (ref 8–27)
Bilirubin Total: 0.4 mg/dL (ref 0.0–1.2)
CO2: 25 mmol/L (ref 20–29)
Calcium: 9.5 mg/dL (ref 8.6–10.2)
Chloride: 99 mmol/L (ref 96–106)
Creatinine, Ser: 1.07 mg/dL (ref 0.76–1.27)
Globulin, Total: 1.8 g/dL (ref 1.5–4.5)
Glucose: 83 mg/dL (ref 70–99)
Potassium: 4.8 mmol/L (ref 3.5–5.2)
Sodium: 137 mmol/L (ref 134–144)
Total Protein: 6.4 g/dL (ref 6.0–8.5)
eGFR: 72 mL/min/{1.73_m2} (ref >59)

## 2022-12-26 LAB — LIPID PANEL WITH LDL/HDL RATIO
Cholesterol, Total: 151 mg/dL (ref 100–199)
HDL: 72 mg/dL (ref >39)
LDL Chol Calc (NIH): 62 mg/dL (ref 0–99)
LDL/HDL Ratio: 0.9 ratio (ref 0.0–3.6)
Triglycerides: 94 mg/dL (ref 0–149)
VLDL Cholesterol Cal: 17 mg/dL (ref 5–40)

## 2022-12-26 NOTE — Progress Notes (Signed)
Please inform him that the labs including liver, kidney function, blood count and cholesterol are normal

## 2022-12-27 NOTE — Progress Notes (Signed)
Called patient to inform him about his lab results. Patient understood

## 2023-01-24 ENCOUNTER — Ambulatory Visit
Admission: RE | Admit: 2023-01-24 | Discharge: 2023-01-24 | Disposition: A | Discharge status: Home / Self Care | Payer: Medicare HMO | Source: Ambulatory Visit | Attending: Pulmonary Disease | Admitting: Pulmonary Disease

## 2023-01-24 DIAGNOSIS — J432 Centrilobular emphysema: Secondary | ICD-10-CM

## 2023-01-24 DIAGNOSIS — I7122 Aneurysm of the aortic arch, without rupture: Secondary | ICD-10-CM

## 2023-01-24 MED ORDER — IOPAMIDOL (ISOVUE-300) INJECTION 61%
75.0000 mL | Freq: Once | INTRAVENOUS | Status: AC | PRN
  Administered 2023-01-24: 75 mL via INTRAVENOUS

## 2023-01-25 ENCOUNTER — Ambulatory Visit: Payer: Medicare HMO | Admitting: Adult Health

## 2023-01-26 ENCOUNTER — Other Ambulatory Visit: Payer: Self-pay | Admitting: Cardiology

## 2023-01-26 DIAGNOSIS — I10 Essential (primary) hypertension: Secondary | ICD-10-CM

## 2023-01-26 DIAGNOSIS — Z8679 Personal history of other diseases of the circulatory system: Secondary | ICD-10-CM

## 2023-01-30 ENCOUNTER — Other Ambulatory Visit (HOSPITAL_BASED_OUTPATIENT_CLINIC_OR_DEPARTMENT_OTHER): Payer: Medicare HMO

## 2023-02-01 ENCOUNTER — Ambulatory Visit: Payer: Medicare HMO | Admitting: Adult Health

## 2023-02-01 ENCOUNTER — Encounter: Payer: Self-pay | Admitting: Adult Health

## 2023-02-01 VITALS — BP 104/60 | HR 82 | Temp 97.4°F | Ht 68.0 in | Wt 144.4 lb

## 2023-02-01 DIAGNOSIS — J432 Centrilobular emphysema: Secondary | ICD-10-CM

## 2023-02-01 DIAGNOSIS — Z9189 Other specified personal risk factors, not elsewhere classified: Secondary | ICD-10-CM

## 2023-02-01 DIAGNOSIS — R911 Solitary pulmonary nodule: Secondary | ICD-10-CM | POA: Diagnosis not present

## 2023-02-01 DIAGNOSIS — I7122 Aneurysm of the aortic arch, without rupture: Secondary | ICD-10-CM | POA: Diagnosis not present

## 2023-02-01 NOTE — Patient Instructions (Addendum)
CT chest in 1 year to follow lung nodules.  Mucinex DM Twice daily  For cough/congestion  Albuterol inhaler As needed   Activity as tolerated.  Work on not smoking  Saline nasal spray As needed   Flonase daily as needed.  Prevnar 20 vaccine .  Follow up Dr. Vassie Loll  In 6 months and As needed   Please contact office for sooner follow up if symptoms do not improve or worsen or seek emergency care

## 2023-02-01 NOTE — Assessment & Plan Note (Addendum)
Stable pulmonary nodules.  Patient is high risk.  Check CT chest in 1 year.Smoking cessation discussed in detail.

## 2023-02-01 NOTE — Assessment & Plan Note (Signed)
Continue follow-up with cardiology.  Recent CT chest showed stability

## 2023-02-01 NOTE — Assessment & Plan Note (Signed)
Appears compensated.  Symptom burden seems to be mild.  Continue on albuterol as needed.  Smoking cessation discussed. Prevnar vaccine today.  Plan  Patient Instructions  CT chest in 1 year to follow lung nodules.  Mucinex DM Twice daily  For cough/congestion  Albuterol inhaler As needed   Activity as tolerated.  Work on not smoking  Saline nasal spray As needed   Flonase daily as needed.  Prevnar 20 vaccine .  Follow up Dr. Vassie Loll  In 6 months and As needed   Please contact office for sooner follow up if symptoms do not improve or worsen or seek emergency care

## 2023-02-01 NOTE — Progress Notes (Signed)
@Patient  ID: Kurt Allen, male    DOB: [birth year], 77 y.o.   MRN: [id number]  Chief Complaint  Patient presents with   Follow-up    Referring provider: No ref. 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  Patient has underlying emphysema.  Says he really does not have a whole lot of symptoms.  Has intermittent cough first thing in am.  He continues to smoke.  We discussed smoking cessation.  Has an albuterol inhaler that he uses 2-3 times a week.  Does not feel that he needs any type of maintenance inhaler. CT chest June 2024 showed waxing and waning small bilateral pulmonary nodules favoring infectious/inflammatory nodules largest nodule measures 0.4 cm.  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 We discussed with his smoking history to follow-up CT chest in 1 year. Remains active, works in the yard and garden.    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  As of 02/01/23 BT    Ready to quit: Not Answered Counseling given: Not Answered Tobacco comments: smokes 1 in a week.  As of 02/01/23 BT    Outpatient Medications Prior to Visit  Medication Sig Dispense Refill   acetaminophen (TYLENOL) 325 MG tablet Take 2 tablets (650 mg total) by mouth every 6 (six) hours as needed for mild pain.     albuterol (VENTOLIN HFA) 108 (90 Base) MCG/ACT inhaler Inhale 1 puff into the lungs every 4 (four) hours as needed for wheezing or shortness of breath. 18 g 5   aspirin EC 81 MG EC tablet Take 1 tablet (81 mg total) by mouth daily. Swallow whole.     Cholecalciferol (VITAMIN D3) 75 MCG (3000 UT) TABS Take 1 tablet by mouth daily. 90 tablet 3   fluticasone (FLONASE) 50 MCG/ACT nasal spray Place 1 spray into both nostrils daily as needed for rhinitis.  16 g 3   losartan (COZAAR) 25 MG tablet TAKE 1 TABLET EVERY DAY 90 tablet 3   magnesium oxide (MAG-OX) 400 (241.3 Mg) MG tablet Take 1 tablet (400 mg total) by mouth 2 (two) times daily. 30 tablet 0   Multiple Vitamin (MULTIVITAMIN) tablet Take 1 tablet by mouth daily.     rosuvastatin (CRESTOR) 5 MG tablet TAKE 1 TABLET EVERY DAY 90 tablet 3   tamsulosin (FLOMAX) 0.4 MG CAPS capsule Take 0.4 mg by mouth daily.      Cholecalciferol (VITAMIN D3) 1.25 MG (50000 UT) CAPS Take 50,000 Units by mouth  once a week. 6 capsule 0   No facility-administered medications prior to visit.     Review of Systems:   Constitutional:   No  weight loss, night sweats,  Fevers, chills, + fatigue, or  lassitude.  HEENT:   No headaches,  Difficulty swallowing,  Tooth/dental problems, or  Sore throat,                No sneezing, itching, ear ache, nasal congestion, post nasal drip,   CV:  No chest pain,  Orthopnea, PND, swelling in lower extremities, anasarca, dizziness, palpitations, syncope.   GI  No heartburn, indigestion, abdominal pain, nausea, vomiting, diarrhea, change in bowel habits, loss of appetite, bloody stools.   Resp: .  No chest wall deformity  Skin: no rash or lesions.  GU: no dysuria, change in color of urine, no urgency or frequency.  No flank pain, no hematuria   MS:  No joint pain or swelling.  No decreased range of motion.  No back pain.    Physical Exam  BP 104/60 (BP Location: Left Arm, Patient Position: Sitting, Cuff Size: Normal)   Pulse 82   Temp (!) 97.4 F (36.3 C) (Temporal)   Ht 5\' 8"  (1.727 m)   Wt 144 lb 6.4 oz (65.5 kg)   SpO2 97%   BMI 21.96 kg/m   GEN: A/Ox3; pleasant , NAD, well nourished    HEENT:  [place]/AT,   NOSE-clear, THROAT-clear, no lesions, no postnasal drip or exudate noted.   NECK:  Supple w/ fair ROM; no JVD; normal carotid impulses w/o bruits; no thyromegaly or nodules palpated; no lymphadenopathy.    RESP  Clear  P & A; w/o, wheezes/ rales/ or rhonchi. no accessory muscle use, no dullness to percussion  CARD:  RRR, no m/r/g, no peripheral edema, pulses intact, no cyanosis or clubbing.  GI:   Soft & nt; nml bowel sounds; no organomegaly or masses detected.   Musco: Warm bil, no deformities or joint swelling noted.   Neuro: alert, no focal deficits noted.    Skin: Warm, no lesions or rashes    Lab Results:    BNP No results found for: "BNP"  ProBNP No results found for: "PROBNP"  Imaging: CT Chest W Contrast  Result  Date: 01/30/2023 CLINICAL DATA:  Follow-up pulmonary nodules.  Dyspnea. EXAM: CT CHEST WITH CONTRAST TECHNIQUE: Multidetector CT imaging of the chest was performed during intravenous contrast administration. RADIATION DOSE REDUCTION: This exam was performed according to the departmental dose-optimization program which includes automated exposure control, adjustment of the mA and/or kV according to patient size and/or use of iterative reconstruction technique. CONTRAST:  75mL ISOVUE-300 IOPAMIDOL (ISOVUE-300) INJECTION 61% COMPARISON:  08/08/2022 chest CT angiogram. FINDINGS: Cardiovascular: Normal heart size. No significant pericardial effusion/thickening. Aortic valve prosthesis in place. Three-vessel coronary atherosclerosis. Atherosclerotic thoracic aorta with stable postsurgical changes from ascending thoracic aortic repair. 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 No evidence of aortic dissection, pseudoaneurysm or penetrating atherosclerotic ulcer. Normal caliber pulmonary arteries. No central pulmonary emboli. Mediastinum/Nodes: No significant thyroid nodules. Unremarkable esophagus. No pathologically enlarged axillary, mediastinal or hilar lymph nodes. Lungs/Pleura: No pneumothorax. No pleural effusion. Severe centrilobular emphysema with diffuse bronchial wall thickening. No acute consolidative airspace disease or lung masses. Small solid pulmonary nodules scattered throughout the upper lobes, some of which have resolved, some of which are new and some of which are stable. Representative new 0.4 cm anterior right upper lobe nodule (series 5/image 59) and representative new 0.4 cm apical left upper lobe nodule (series 5/image 31). Previously described dominant 0.5 cm right upper lobe pulmonary nodule is stable size and decreased density (series 5/image 61). Previously visualized 0.5 cm right upper lobe nodule on series 8/image 64 is resolved on today's scan. Anterior left upper lobe 0.4 cm  nodule (series 5/image 47), stable. Upper abdomen: Small hiatal hernia. Subcentimeter scattered hypodense liver lesions are too small to characterize and are unchanged, presumably benign. Right adrenal 2.1 cm nodule with density 43 HU and left adrenal 2.8 cm nodule with density 31 HU, both stable size and previously characterized as benign adenomas. Musculoskeletal: No aggressive appearing focal osseous lesions. Mild thoracic spondylosis. IMPRESSION: 1. Waxing and waning small bilateral upper lobe pulmonary nodules as detailed, indeterminate but favoring infectious or inflammatory nodules. Largest new pulmonary nodule measures 0.4 cm. Recommend attention on follow-up noncontrast chest CT in 6 months. 2. Three-vessel coronary atherosclerosis. 3. Small hiatal hernia. 4. Stable bilateral adrenal adenomas, for which no follow-up imaging is recommended. 5. Stable postsurgical changes from ascending thoracic aortic repair. 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 Continued annual chest CT surveillance warranted. 6.  Emphysema (ICD10-J43.9). Electronically Signed   By: Delbert Phenix M.D.   On: 01/30/2023 17:14         Latest Ref Rng & Units 04/13/2020    9:52 AM  PFT Results  FVC-Pre L 3.98   FVC-Predicted Pre % 100   FVC-Post L 4.00   FVC-Predicted Post % 100   Pre FEV1/FVC % % 54   Post FEV1/FCV % % 53   FEV1-Pre L 2.17   FEV1-Predicted Pre % 75   FEV1-Post L 2.14   DLCO uncorrected ml/min/mmHg 15.01   DLCO UNC% % 62   DLCO corrected ml/min/mmHg 14.97   DLCO COR %Predicted % 62   DLVA Predicted % 64   TLC L 7.85   TLC % Predicted % 118   RV % Predicted % 144     No results found for: "NITRICOXIDE"      Assessment & Plan:   Centrilobular emphysema (HCC) Appears compensated.  Symptom burden seems to be mild.  Continue on albuterol as needed.  Smoking cessation discussed. Prevnar vaccine today.  Plan  Patient Instructions  CT chest in 1 year to follow lung nodules.   Mucinex DM Twice daily  For cough/congestion  Albuterol inhaler As needed   Activity as tolerated.  Work on not smoking  Saline nasal spray As needed   Flonase daily as needed.  Prevnar 20 vaccine .  Follow up Dr. Vassie Loll  In 6 months and As needed   Please contact office for sooner follow up if symptoms do not improve or worsen or seek emergency care     Pulmonary nodule less than 1 cm in diameter with moderate to high risk for malignant neoplasm Stable pulmonary nodules.  Patient is high risk.  Check CT chest in 1 year.Smoking cessation discussed in detail.  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  Recent CT chest showed stability     Rubye Oaks, NP 02/01/2023

## 2023-02-28 ENCOUNTER — Other Ambulatory Visit: Payer: Self-pay | Admitting: Cardiology

## 2023-02-28 DIAGNOSIS — E7800 Pure hypercholesterolemia, unspecified: Secondary | ICD-10-CM

## 2023-05-09 ENCOUNTER — Other Ambulatory Visit: Payer: Self-pay

## 2023-05-09 MED ORDER — TAMSULOSIN HCL 0.4 MG PO CAPS: 0.4000 mg | ORAL_CAPSULE | Freq: Every day | ORAL | 3 refills | Status: DC

## 2023-06-12 ENCOUNTER — Encounter: Payer: Self-pay | Admitting: Family Medicine

## 2023-06-12 ENCOUNTER — Ambulatory Visit (INDEPENDENT_AMBULATORY_CARE_PROVIDER_SITE_OTHER): Payer: Medicare HMO | Admitting: Family Medicine

## 2023-06-12 VITALS — BP 118/72 | HR 66 | Ht 68.0 in | Wt 143.8 lb

## 2023-06-12 DIAGNOSIS — Z7689 Persons encountering health services in other specified circumstances: Secondary | ICD-10-CM

## 2023-06-12 DIAGNOSIS — Z23 Encounter for immunization: Secondary | ICD-10-CM | POA: Diagnosis not present

## 2023-06-12 DIAGNOSIS — Z720 Tobacco use: Secondary | ICD-10-CM | POA: Diagnosis not present

## 2023-06-12 MED ORDER — BUPROPION HCL ER (SR) 150 MG PO TB12
ORAL_TABLET | ORAL | 2 refills | Status: DC
Start: 2023-06-12 — End: 2023-09-12

## 2023-06-12 NOTE — Assessment & Plan Note (Signed)
Patient agreeable to trying bupropion again.  Started with 150 mg bupropion SR daily for 3 days then increase to twice daily.  Goal of tapering back down after 3 months.  Will see him back at approximately that time.  Emphasized importance of consciously making effort to decrease his tobacco use during this time.

## 2023-06-12 NOTE — Progress Notes (Unsigned)
New Patient Office Visit  Subjective    Patient ID: Kurt Allen, male    DOB: 05/22/1946  Age: 77 y.o. MRN: [id number]  CC:  Chief Complaint  Patient presents with   New Patient (Initial Visit)    HPI Kurt Allen presents to establish care Was seeing dr. Clarene Duke prior to this.  Patient sees Dr. Jacinto Halim for cardiology and Dr. Vassie Loll for pulmonology.  We went over his medication list and it is up to date.  Patient has no questions or concerns today.  He would like to try quit smoking.  Usually smokes 6 to 10 cigarettes a day.  He has tried nicotine patches in the past and bupropion.  Thinks the bupropion may be giving him bad dreams when he took it but then now attributes it to the nicotine patch and is okay with taking bupropion again.  No history of seizure disorder.  Next we talked about patient's pulmonary nodules seen on CT and his recommendation for an upcoming CT scan around December.  PMH: emphysema.  Lung nodule.  AV stensos, s/p AVR and thoracic aortic aneurysm repair. , HTN, tobacco use.  PSH: cataract, AV replacement.,  TAA  FH: no cancer hx.  No heart, kidney disease.  Mother - DM2?   Tobacco use: 1/2 ppd. Has tried to quit in the past. Gum didn't help.  Alcohol use: 1-2/per day.    Drug use: no Marital status: divorced.  Lives with Milinda Cave. 2 children.  Several grandchildren  Employment: retired.  Worked Midwife.    Screenings:  Colon Cancer: no colonoscopy.  Patient gets febrile testing yearly at [facility] pharmacy. Lung Cancer: gets screens. Last one this year.    Outpatient Encounter Medications as of 06/12/2023  Medication Sig   acetaminophen (TYLENOL) 325 MG tablet Take 2 tablets (650 mg total) by mouth every 6 (six) hours as needed for mild pain.   albuterol (VENTOLIN HFA) 108 (90 Base) MCG/ACT inhaler Inhale 1 puff into the lungs every 4 (four) hours as needed for wheezing or shortness of breath.   aspirin EC 81 MG  EC tablet Take 1 tablet (81 mg total) by mouth daily. Swallow whole.   buPROPion (WELLBUTRIN SR) 150 MG 12 hr tablet Take 1 tablet (150 mg total) by mouth daily for 3 days, THEN 1 tablet (150 mg total) 2 (two) times daily for 80 days, THEN 1 tablet (150 mg total) daily for 14 days.   Cholecalciferol (VITAMIN D3) 75 MCG (3000 UT) TABS Take 1 tablet by mouth daily.   fluticasone (FLONASE) 50 MCG/ACT nasal spray Place 1 spray into both nostrils daily as needed for rhinitis.   losartan (COZAAR) 25 MG tablet TAKE 1 TABLET EVERY DAY   magnesium oxide (MAG-OX) 400 (241.3 Mg) MG tablet Take 1 tablet (400 mg total) by mouth 2 (two) times daily.   Multiple Vitamin (MULTIVITAMIN) tablet Take 1 tablet by mouth daily.   rosuvastatin (CRESTOR) 5 MG tablet TAKE 1 TABLET EVERY DAY   tamsulosin (FLOMAX) 0.4 MG CAPS capsule Take 1 capsule (0.4 mg total) by mouth daily.   No facility-administered encounter medications on file as of 06/12/2023.    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/2020   25 mm Edwards Konect Resilia stented bovine pericardial valve synthetic root conduit  with reimplantation of left main and right coronary arteries   Severe aortic stenosis    Thoracic ascending aortic aneurysm (HCC)    Tobacco abuse     Past Surgical History:  Procedure Laterality Date   AORTIC VALVE REPLACEMENT N/A 04/14/2020   Procedure: AORTIC VALVE REPLACEMENT (AVR);  Surgeon: Purcell Nails, MD;  Location: [facility] OR;  Service: Open Heart Surgery;  Laterality: N/A;   BENTALL PROCEDURE N/A 04/14/2020   Procedure: BENTALL AORTIC ROOT REPLACEMENT USING KONECT RESILIA AORTIC VALVED CONDUIT WITH REIMPLANTATION OF RIGHT AND LEFT CORONARY BUTTONS;  Surgeon: Purcell Nails, MD;  Location: [facility] OR;  Service: Open Heart Surgery;   Laterality: N/A;   CARDIAC CATHETERIZATION     EYE SURGERY Bilateral    cataracts   RIGHT HEART CATH AND CORONARY ANGIOGRAPHY N/A 02/17/2020   Procedure: RIGHT HEART CATH AND CORONARY ANGIOGRAPHY;  Surgeon: Yates Decamp, MD;  Location: [facility] INVASIVE CV LAB;  Service: Cardiovascular;  Laterality: N/A;   TEE WITHOUT CARDIOVERSION N/A 04/14/2020   Procedure: TRANSESOPHAGEAL ECHOCARDIOGRAM (TEE);  Surgeon: Purcell Nails, MD;  Location: [facility] OR;  Service: Open Heart Surgery;  Laterality: N/A;   THORACIC AORTIC ANEURYSM REPAIR N/A 04/14/2020   Procedure: THORACIC ASCENDING ANEURYSM REPAIR (AAA);  Surgeon: Purcell Nails, MD;  Location: [facility] OR;  Service: Open Heart Surgery;  Laterality: N/A;    History reviewed. No pertinent family history.  Social History   Socioeconomic History   Marital status: Divorced    Spouse name: Not on file   Number of children: 2   Years of education: Not on file   Highest education level: Not on file  Occupational History   Occupation: Retired  Tobacco Use   Smoking status: Some Days    Current packs/day: 0.50    Average packs/day: 0.5 packs/day for 50.0 years (25.0 ttl pk-yrs)    Types: Cigarettes    Passive exposure: Past   Smokeless tobacco: Never   Tobacco comments:    smokes 1 in a week.  As of 02/01/23 BT   Vaping Use   Vaping status: Former   Start date: 08/21/2016   Quit date: 09/21/2016  Substance and Sexual Activity   Alcohol use: Yes    Alcohol/week: 10.0 standard drinks of alcohol    Types: 10 Cans of beer per week    Comment: per week   Drug use: Never   Sexual activity: Not Currently  Other Topics Concern   Not on file  Social History Narrative   ** Merged History Encounter **       Social Determinants of Health   Financial Resource Strain: Not on file  Food Insecurity: Not on file  Transportation Needs: Not on file  Physical Activity: Not on file  Stress: Not on file  Social Connections: Not on file  Intimate Partner Violence: Not  on file    ROS      Objective    BP 118/72   Pulse 66   Ht 5\' 8"  (1.727 m)   Wt 143 lb 12.8 oz (65.2 kg)   SpO2 99%   BMI 21.86 kg/m   Physical Exam General: Alert, oriented HEENT: PERRLA, EOMI.  Arcus senilis bilaterally.  Upper and lower dentures CV: Right-sided systolic 2/6 murmur.  Regular rate. Pulmonary: No wheezing or crackles.  No dyspnea or respiratory distress.  No tachypnea. GI, soft, normal bowel sounds Extremities: No pedal edema MSK: Strength equal bilaterally, normal gait Psych: Pleasant affect Skin: Significant sun damage on  the face and hands.     Assessment & Plan:   Encounter to establish care  Need for influenza vaccination -     Flu Vaccine Trivalent High Dose (Fluad)  Tobacco abuse Assessment & Plan: Patient agreeable to trying bupropion again.  Started with 150 mg bupropion SR daily for 3 days then increase to twice daily.  Goal of tapering back down after 3 months.  Will see him back at approximately that time.  Emphasized importance of consciously making effort to decrease his tobacco use during this time.  Orders: -     buPROPion HCl ER (SR); Take 1 tablet (150 mg total) by mouth daily for 3 days, THEN 1 tablet (150 mg total) 2 (two) times daily for 80 days, THEN 1 tablet (150 mg total) daily for 14 days.  Dispense: 60 tablet; Refill: 2    Return in about 3 months (around 09/12/2023) for tobacco cessation.   Sandre Kitty, MD

## 2023-06-12 NOTE — Patient Instructions (Signed)
It was nice to see you today,  We addressed the following topics today: -I have prescribed the program for you to take to help you quit smoking.  You will need to consciously decide to reduce how many cigarettes you use when taking this.  This will just help with cravings. - I have prescribed enough to last you approximately 12 weeks.  By that time I would like to see back in the office   Have a great day,  Frederic Jericho, MD

## 2023-08-08 ENCOUNTER — Encounter (HOSPITAL_BASED_OUTPATIENT_CLINIC_OR_DEPARTMENT_OTHER): Payer: Self-pay | Admitting: Pulmonary Disease

## 2023-08-08 ENCOUNTER — Ambulatory Visit (HOSPITAL_BASED_OUTPATIENT_CLINIC_OR_DEPARTMENT_OTHER): Payer: Medicare HMO | Admitting: Pulmonary Disease

## 2023-08-08 VITALS — BP 128/68 | HR 47 | Resp 16 | Ht 68.0 in | Wt 145.3 lb

## 2023-08-08 DIAGNOSIS — R911 Solitary pulmonary nodule: Secondary | ICD-10-CM | POA: Diagnosis not present

## 2023-08-08 DIAGNOSIS — J432 Centrilobular emphysema: Secondary | ICD-10-CM

## 2023-08-08 DIAGNOSIS — Z720 Tobacco use: Secondary | ICD-10-CM

## 2023-08-08 DIAGNOSIS — Z9189 Other specified personal risk factors, not elsewhere classified: Secondary | ICD-10-CM | POA: Diagnosis not present

## 2023-08-08 NOTE — Assessment & Plan Note (Addendum)
Follow-up CT chest in 6 months would be due now based on his last CT

## 2023-08-08 NOTE — Patient Instructions (Signed)
X CT chest wo con to follow up on nodules

## 2023-08-08 NOTE — Assessment & Plan Note (Signed)
We have previously discussed medications but he prefers to avoid.  He will use albuterol on an as-needed basis. We discussed signs and symptoms of COPD exacerbation and plan for the same

## 2023-08-08 NOTE — Progress Notes (Signed)
   Subjective:    Patient ID: Kurt Allen, male    DOB: Oct 28, 1945, 76 y.o.   MRN: [id number]  HPI  77 yo smoker for FU of emphysema and lung nodule on low-dose CT.   He started smoking at age 89, maximum of 2 packs/day, more than 100 pack years    PMH - AVR with bioprosthetic valve and TAA repair 03/2020  24-month follow-up visit Breathing is at baseline.  Reviewed serial CT from previous years in June this year showing waxing and waning pulmonary nodules.  Reviewed cardiology evaluation. He has cut down on his cigarettes and in fact was started on bupropion by his PCP, has not smoked in the last 3 days. Vaccinations are up-to-date  Significant tests/ events reviewed   PFTs 03/2020-moderate airway obstruction, ratio 54, FEV1 75%, FVC 90%, TLC 1 1 8% and DLCO 62%  CT chest June 2024 showed waxing and waning small bilateral pulmonary nodules favoring infectious/inflammatory nodules largest nodule measures 0.4 cm. 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  Pt also denies any obvious fluctuation in symptoms with weather or environmental change or other alleviating or aggravating factors.    Pt denies any increase in rescue therapy over baseline, denies waking up needing it or having early am exacerbations or coughing/wheezing/ or dyspnea      Objective:   Physical Exam  Gen. Pleasant, well-nourished, in no distress ENT - no  thrush, no pallor/icterus,no post nasal drip Neck: No JVD, no thyromegaly, no carotid bruits Lungs: no use of accessory muscles, no dullness to percussion, clear without rales or rhonchi  Cardiovascular: Rhythm regular, heart sounds  normal, no murmurs or gallops, no peripheral edema Musculoskeletal: No deformities, no cyanosis or clubbing        Assessment & Plan:

## 2023-08-08 NOTE — Assessment & Plan Note (Signed)
Smoking cessation again emphasized is the most important intervention that would add years to his life.  He has been able to quit for 3 days and we congratulated him on this. Will continue Wellbutrin. Nicotine patch causes him a rash

## 2023-08-20 ENCOUNTER — Ambulatory Visit: Payer: Medicare HMO

## 2023-08-20 DIAGNOSIS — Z0000 Encounter for general adult medical examination without abnormal findings: Secondary | ICD-10-CM

## 2023-08-20 NOTE — Progress Notes (Signed)
Subjective:   Kurt Allen is a 77 y.o. male who presents for Medicare Annual/Subsequent preventive examination.  Visit Complete: Virtual I connected with  Kurt Allen on 08/20/23 by a audio enabled telemedicine application and verified that I am speaking with the correct person using two identifiers.  Patient Location: Home  Provider Location: Office/Clinic  I discussed the limitations of evaluation and management by telemedicine. The patient expressed understanding and agreed to proceed.  Vital Signs: Because this visit was a virtual/telehealth visit, some criteria may be missing or patient reported. Any vitals not documented were not able to be obtained and vitals that have been documented are patient reported.    Cardiac Risk Factors include: advanced age (>62men, >9 women);male gender     Objective:    Today's Vitals   There is no height or weight on file to calculate BMI.     08/20/2023   10:46 AM 04/14/2020    5:56 AM 04/12/2020    2:07 PM 02/17/2020    9:53 AM  Advanced Directives  Does Patient Have a Medical Advance Directive? No No No No  Would patient like information on creating a medical advance directive?  No - Patient declined No - Patient declined No - Patient declined    Current Medications (verified) Outpatient Encounter Medications as of 08/20/2023  Medication Sig   acetaminophen (TYLENOL) 325 MG tablet Take 2 tablets (650 mg total) by mouth every 6 (six) hours as needed for mild pain.   albuterol (VENTOLIN HFA) 108 (90 Base) MCG/ACT inhaler Inhale 1 puff into the lungs every 4 (four) hours as needed for wheezing or shortness of breath.   aspirin EC 81 MG EC tablet Take 1 tablet (81 mg total) by mouth daily. Swallow whole.   buPROPion (WELLBUTRIN SR) 150 MG 12 hr tablet Take 1 tablet (150 mg total) by mouth daily for 3 days, THEN 1 tablet (150 mg total) 2 (two) times daily for 80 days, THEN 1 tablet (150 mg total) daily for 14 days.    Cholecalciferol (VITAMIN D3) 75 MCG (3000 UT) TABS Take 1 tablet by mouth daily.   fluticasone (FLONASE) 50 MCG/ACT nasal spray Place 1 spray into both nostrils daily as needed for rhinitis.   losartan (COZAAR) 25 MG tablet TAKE 1 TABLET EVERY DAY   magnesium oxide (MAG-OX) 400 (241.3 Mg) MG tablet Take 1 tablet (400 mg total) by mouth 2 (two) times daily.   Multiple Vitamin (MULTIVITAMIN) tablet Take 1 tablet by mouth daily.   rosuvastatin (CRESTOR) 5 MG tablet TAKE 1 TABLET EVERY DAY   tamsulosin (FLOMAX) 0.4 MG CAPS capsule Take 1 capsule (0.4 mg total) by mouth daily.   No facility-administered encounter medications on file as of 08/20/2023.    Allergies (verified) Patient has no known allergies.   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   25 mm Edwards Konect Resilia stented bovine pericardial valve synthetic root conduit with reimplantation of left main and right coronary arteries   Severe aortic stenosis    Thoracic ascending aortic aneurysm (HCC)    Tobacco abuse    Past Surgical History:  Procedure Laterality Date   AORTIC VALVE REPLACEMENT N/A 04/14/2020   Procedure: AORTIC VALVE REPLACEMENT (AVR);  Surgeon: Purcell Nails, MD;  Location: [facility] OR;  Service:  Open Heart Surgery;  Laterality: N/A;   BENTALL PROCEDURE N/A 04/14/2020   Procedure: BENTALL AORTIC ROOT REPLACEMENT USING KONECT RESILIA AORTIC VALVED CONDUIT WITH REIMPLANTATION OF RIGHT AND LEFT CORONARY BUTTONS;  Surgeon: Purcell Nails, MD;  Location: [facility] OR;  Service: Open Heart Surgery;  Laterality: N/A;   CARDIAC CATHETERIZATION     EYE SURGERY Bilateral    cataracts   RIGHT HEART CATH AND CORONARY ANGIOGRAPHY N/A 02/17/2020   Procedure: RIGHT HEART CATH AND CORONARY ANGIOGRAPHY;  Surgeon: Yates Decamp,  MD;  Location: [facility] INVASIVE CV LAB;  Service: Cardiovascular;  Laterality: N/A;   TEE WITHOUT CARDIOVERSION N/A 04/14/2020   Procedure: TRANSESOPHAGEAL ECHOCARDIOGRAM (TEE);  Surgeon: Purcell Nails, MD;  Location[facility] OR;  Service: Open Heart Surgery;  Laterality: N/A;   THORACIC AORTIC ANEURYSM REPAIR N/A 04/14/2020   Procedure: THORACIC ASCENDING ANEURYSM REPAIR (AAA);  Surgeon: Purcell Nails, MD;  Location: [facility] OR;  Service: Open Heart Surgery;  Laterality: N/A;   History reviewed. No pertinent family history. Social History   Socioeconomic History   Marital status: Divorced    Spouse name: Not on file   Number of children: 2   Years of education: Not on file   Highest education level: Not on file  Occupational History   Occupation: Retired  Tobacco Use   Smoking status: Some Days    Current packs/day: 0.50    Average packs/day: 0.5 packs/day for 50.0 years (25.0 ttl pk-yrs)    Types: Cigarettes    Passive exposure: Past   Smokeless tobacco: Never   Tobacco comments:    smokes 1 in a week.  As of 02/01/23 BT   Vaping Use   Vaping status: Former   Start date: 08/21/2016   Quit date: 09/21/2016  Substance and Sexual Activity   Alcohol use: Yes    Alcohol/week: 10.0 standard drinks of alcohol    Types: 10 Cans of beer per week    Comment: per week   Drug use: Never   Sexual activity: Not Currently  Other Topics Concern   Not on file  Social History Narrative   ** Merged History Encounter **       Social Drivers of Health   Financial Resource Strain: Low Risk  (08/20/2023)   Overall Financial Resource Strain (CARDIA)    Difficulty of Paying Living Expenses: Not hard at all  Food Insecurity: No Food Insecurity (08/20/2023)   Hunger Vital Sign    Worried About Running Out of Food in the Last Year: Never true    Ran Out of Food in the Last Year: Never true  Transportation Needs: No Transportation Needs (08/20/2023)   PRAPARE - Administrator, Civil Service  (Medical): No    Lack of Transportation (Non-Medical): No  Physical Activity: Insufficiently Active (08/20/2023)   Exercise Vital Sign    Days of Exercise per Week: 3 days    Minutes of Exercise per Session: 10 min  Stress: No Stress Concern Present (08/20/2023)   [employer] of Occupational Health - Occupational Stress Questionnaire    Feeling of Stress : Not at all  Social Connections: Socially Isolated (08/20/2023)   Social Connection and Isolation Panel [NHANES]    Frequency of Communication with Friends and Family: More than three times a week    Frequency of Social Gatherings with Friends and Family: Twice a week    Attends Religious Services: Never    Database administrator or Organizations: No  Attends Banker Meetings: Never    Marital Status: Divorced    Tobacco Counseling Ready to quit: Yes Counseling given: Not Answered Tobacco comments: smokes 1 in a week.  As of 02/01/23 BT    Clinical Intake:  Pre-visit preparation completed: Yes  Pain : No/denies pain     Nutritional Risks: None Diabetes: No  How often do you need to have someone help you when you read instructions, pamphlets, or other written materials from your doctor or pharmacy?: 1 - Never  Interpreter Needed?: No  Information entered by :: NAllen LPN   Activities of Daily Living    08/20/2023   10:42 AM  In your present state of health, do you have any difficulty performing the following activities:  Hearing? 0  Vision? 0  Difficulty concentrating or making decisions? 0  Walking or climbing stairs? 0  Dressing or bathing? 0  Doing errands, shopping? 0  Preparing Food and eating ? N  Using the Toilet? N  In the past six months, have you accidently leaked urine? N  Do you have problems with loss of bowel control? N  Managing your Medications? N  Managing your Finances? N  Housekeeping or managing your Housekeeping? N    Patient Care Team: Sandre Kitty, MD as PCP  - General (Family Medicine)  Indicate any recent Medical Services you may have received from other than [facility] providers in the past year (date may be approximate).     Assessment:   This is a routine wellness examination for [place].  Hearing/Vision screen Hearing Screening - Comments:: Denies hearing issues Vision Screening - Comments:: Regular eye exams, [facility]   Goals Addressed             This Visit's Progress    Patient Stated       08/20/2023, quit smoking       Depression Screen    08/20/2023   10:47 AM 06/12/2023    3:18 PM 09/15/2020    1:52 PM 07/20/2020    1:42 PM 07/20/2020   10:14 AM  PHQ 2/9 Scores  PHQ - 2 Score 0 0 0 0 0  PHQ- 9 Score 0 0       Fall Risk    08/20/2023   10:46 AM 06/12/2023    3:41 PM 07/20/2020   12:18 PM  Fall Risk   Falls in the past year? 0 0 0  Number falls in past yr: 0 0 0  Injury with Fall? 0 0 0  Risk for fall due to : Medication side effect No Fall Risks No Fall Risks  Follow up Falls prevention discussed;Falls evaluation completed Falls evaluation completed Falls evaluation completed    MEDICARE RISK AT HOME: Medicare Risk at Home Any stairs in or around the home?: Yes If so, are there any without handrails?: No Home free of loose throw rugs in walkways, pet beds, electrical cords, etc?: Yes Adequate lighting in your home to reduce risk of falls?: Yes Life alert?: No Use of a cane, walker or w/c?: No Grab bars in the bathroom?: No Shower chair or bench in shower?: No Elevated toilet seat or a handicapped toilet?: No  TIMED UP AND GO:  Was the test performed?  No    Cognitive Function:        08/20/2023   10:48 AM  6CIT Screen  What Year? 0 points  What month? 0 points  What time? 0 points  Count back from 20 0  points  Months in reverse 0 points  Repeat phrase 2 points  Total Score 2 points    Immunizations Immunization History  Administered Date(s) Administered   Fluad Quad(high Dose  65+) 05/13/2020, 05/10/2021   Fluad Trivalent(High Dose 65+) 06/12/2023   Influenza-Unspecified 07/08/2019   PFIZER(Purple Top)SARS-COV-2 Vaccination 10/14/2019, 11/04/2019, 08/05/2020   PNEUMOCOCCAL CONJUGATE-20 02/01/2023    TDAP status: Due, Education has been provided regarding the importance of this vaccine. Advised may receive this vaccine at local pharmacy or Health Dept. Aware to provide a copy of the vaccination record if obtained from local pharmacy or Health Dept. Verbalized acceptance and understanding.  Flu Vaccine status: Up to date  Pneumococcal vaccine status: Up to date  Covid-19 vaccine status: Information provided on how to obtain vaccines.   Qualifies for Shingles Vaccine? Yes   Zostavax completed No   Shingrix Completed?: No.    Education has been provided regarding the importance of this vaccine. Patient has been advised to call insurance company to determine out of pocket expense if they have not yet received this vaccine. Advised may also receive vaccine at local pharmacy or Health Dept. Verbalized acceptance and understanding.  Screening Tests Health Maintenance  Topic Date Due   Hepatitis C Screening  Never done   DTaP/Tdap/Td (1 - Tdap) Never done   Zoster Vaccines- Shingrix (1 of 2) Never done   COVID-19 Vaccine (4 - 2024-25 season) 04/22/2023   Lung Cancer Screening  01/24/2024   Medicare Annual Wellness (AWV)  08/19/2024   Pneumonia Vaccine 22+ Years old  Completed   INFLUENZA VACCINE  Completed   HPV VACCINES  Aged Out    Health Maintenance  Health Maintenance Due  Topic Date Due   Hepatitis C Screening  Never done   DTaP/Tdap/Td (1 - Tdap) Never done   Zoster Vaccines- Shingrix (1 of 2) Never done   COVID-19 Vaccine (4 - 2024-25 season) 04/22/2023    Colorectal cancer screening: No longer required.   Lung Cancer Screening: (Low Dose CT Chest recommended if Age 52-80 years, 20 pack-year currently smoking OR have quit w/in 15years.) does  qualify.   Lung Cancer Screening Referral: CT scan 09/13/2023  Additional Screening:  Hepatitis C Screening: does qualify;   Vision Screening: Recommended annual ophthalmology exams for early detection of glaucoma and other disorders of the eye. Is the patient up to date with their annual eye exam?  Yes  Who is the provider or what is the name of the office in which the patient attends annual eye exams? [facility] If pt is not established with a provider, would they like to be referred to a provider to establish care? No .   Dental Screening: Recommended annual dental exams for proper oral hygiene  Diabetic Foot Exam: n/a  Community Resource Referral / Chronic Care Management: CRR required this visit?  No   CCM required this visit?  No     Plan:     I have personally reviewed and noted the following in the patient's chart:   Medical and social history Use of alcohol, tobacco or illicit drugs  Current medications and supplements including opioid prescriptions. Patient is not currently taking opioid prescriptions. Functional ability and status Nutritional status Physical activity Advanced directives List of other physicians Hospitalizations, surgeries, and ER visits in previous 12 months Vitals Screenings to include cognitive, depression, and falls Referrals and appointments  In addition, I have reviewed and discussed with patient certain preventive protocols, quality metrics, and best practice recommendations.  A written personalized care plan for preventive services as well as general preventive health recommendations were provided to patient.     Barb Merino, LPN   [id number]   After Visit Summary: (MyChart) Due to this being a telephonic visit, the after visit summary with patients personalized plan was offered to patient via MyChart   Nurse Notes: none

## 2023-08-20 NOTE — Patient Instructions (Signed)
Mr. Kurt Allen , Thank you for taking time to come for your Medicare Wellness Visit. I appreciate your ongoing commitment to your health goals. Please review the following plan we discussed and let me know if I can assist you in the future.   Referrals/Orders/Follow-Ups/Clinician Recommendations: none  This is a list of the screening recommended for you and due dates:  Health Maintenance  Topic Date Due   Hepatitis C Screening  Never done   DTaP/Tdap/Td vaccine (1 - Tdap) Never done   Zoster (Shingles) Vaccine (1 of 2) Never done   COVID-19 Vaccine (4 - 2024-25 season) 04/22/2023   Screening for Lung Cancer  01/24/2024   Medicare Annual Wellness Visit  08/19/2024   Pneumonia Vaccine  Completed   Flu Shot  Completed   HPV Vaccine  Aged Out    Advanced directives: (ACP Link)Information on Advanced Care Planning can be found at [facility] of [place] Advance Health Care Directives Advance Health Care Directives ([email or web address]   Next Medicare Annual Wellness Visit scheduled for next year: Yes  insert Preventive Care attachment Insert FALL PREVENTION attachment if needed

## 2023-09-12 ENCOUNTER — Ambulatory Visit (INDEPENDENT_AMBULATORY_CARE_PROVIDER_SITE_OTHER): Payer: Medicare HMO | Admitting: Family Medicine

## 2023-09-12 ENCOUNTER — Encounter: Payer: Self-pay | Admitting: Family Medicine

## 2023-09-12 VITALS — BP 132/76 | HR 62 | Ht 68.0 in | Wt 151.1 lb

## 2023-09-12 DIAGNOSIS — Z720 Tobacco use: Secondary | ICD-10-CM | POA: Diagnosis not present

## 2023-09-12 DIAGNOSIS — N400 Enlarged prostate without lower urinary tract symptoms: Secondary | ICD-10-CM | POA: Insufficient documentation

## 2023-09-12 DIAGNOSIS — N401 Enlarged prostate with lower urinary tract symptoms: Secondary | ICD-10-CM | POA: Diagnosis not present

## 2023-09-12 MED ORDER — BUPROPION HCL ER (SR) 150 MG PO TB12: 150.0000 mg | ORAL_TABLET | Freq: Two times a day (BID) | ORAL | 1 refills | Status: DC

## 2023-09-12 MED ORDER — TAMSULOSIN HCL 0.4 MG PO CAPS: 0.4000 mg | ORAL_CAPSULE | Freq: Every day | ORAL | 3 refills | Status: AC

## 2023-09-12 NOTE — Assessment & Plan Note (Signed)
Flomax prescribed previously by his cardiologist before he had a PCP.  Needs refill and ask if I would be able to refill it.  Sending in refills today.  No issues with the Flomax, working well for him.

## 2023-09-12 NOTE — Progress Notes (Signed)
   Established Patient Office Visit  Subjective   Patient ID: Kurt Allen, male    DOB: 1946/08/15  Age: 78 y.o. MRN: [id number]  Chief Complaint  Patient presents with   Medical Management of Chronic Issues    HPI  Smoking-patient i states he has not had a cigarette in 10 days.  He is using the Wellbutrin twice a day, no other medications or nicotine replacement to help him.  We talked about how he would stop taking this medication if he decides to stop after he has finished smoking, but also advised it is appropriate to be on this medication for decades depending on the reason for taking it.  Patient has a CT scan of his chest tomorrow.  He has followed up with the pulmonologist.  Patient takes Flomax for BPH.  Has no issues with this, is wanting me to refill it.  Previously Dr. Jacinto Halim was refilling it before he had a PCP.  The 10-year ASCVD risk score (Arnett DK, et al., 2019) is: 27.9%  Health Maintenance Due  Topic Date Due   Hepatitis C Screening  Never done   DTaP/Tdap/Td (1 - Tdap) Never done   Zoster Vaccines- Shingrix (1 of 2) Never done   COVID-19 Vaccine (4 - 2024-25 season) 04/22/2023      Objective:     BP 132/76   Pulse 62   Ht 5\' 8"  (1.727 m)   Wt 151 lb 1.9 oz (68.5 kg)   SpO2 98%   BMI 22.98 kg/m    Physical Exam General: Alert, oriented Pulmonary: No respiratory distress Psych: Pleasant affect   No results found for any visits on 09/12/23.      Assessment & Plan:   Benign prostatic hyperplasia with lower urinary tract symptoms, symptom details unspecified Assessment & Plan: Flomax prescribed previously by his cardiologist before he had a PCP.  Needs refill and ask if I would be able to refill it.  Sending in refills today.  No issues with the Flomax, working well for him.   Tobacco abuse Assessment & Plan: 10 days now without a cigarette.  Advised him to continue bupropion for at least 1 month after he has quit smoking, although  he could choose to remain on the program if he chooses.  Discussed tapering off gradually over over 1 month period  Approximately.  Orders: -     buPROPion HCl ER (SR); Take 1 tablet (150 mg total) by mouth 2 (two) times daily.  Dispense: 180 tablet; Refill: 1  Other orders -     Tamsulosin HCl; Take 1 capsule (0.4 mg total) by mouth daily.  Dispense: 90 capsule; Refill: 3     Return in about 4 months (around 01/10/2024) for physical.    Sandre Kitty, MD

## 2023-09-12 NOTE — Assessment & Plan Note (Signed)
10 days now without a cigarette.  Advised him to continue bupropion for at least 1 month after he has quit smoking, although he could choose to remain on the program if he chooses.  Discussed tapering off gradually over over 1 month period  Approximately.

## 2023-09-12 NOTE — Patient Instructions (Signed)
It was nice to see you today,  We addressed the following topics today: -Congratulations on going 10 days without smoking.  Continue taking the medication twice a day.  I would go at least 1 month without smoking before you decide to stop taking this medicine. - If you decide to stop taking this medicine, I would start by decreasing it back to once a day for 2 weeks.  After that you can take it 1 time every other day for another 2 weeks.  You can then stop taking it completely. - If you choose to stay on the medicine that is also acceptable.  This medicine is designed to be taken daily for decades or more.   Have a great day,  Frederic Jericho, MD

## 2023-09-13 ENCOUNTER — Ambulatory Visit (HOSPITAL_BASED_OUTPATIENT_CLINIC_OR_DEPARTMENT_OTHER)
Admission: RE | Admit: 2023-09-13 | Discharge: 2023-09-13 | Disposition: A | Discharge status: Home / Self Care | Payer: Medicare HMO | Source: Ambulatory Visit | Attending: Pulmonary Disease | Admitting: Pulmonary Disease

## 2023-09-13 DIAGNOSIS — R911 Solitary pulmonary nodule: Secondary | ICD-10-CM | POA: Insufficient documentation

## 2023-09-13 DIAGNOSIS — Z9189 Other specified personal risk factors, not elsewhere classified: Secondary | ICD-10-CM | POA: Diagnosis present

## 2023-09-23 ENCOUNTER — Encounter (HOSPITAL_BASED_OUTPATIENT_CLINIC_OR_DEPARTMENT_OTHER): Payer: Self-pay | Admitting: Pulmonary Disease

## 2023-09-24 NOTE — Progress Notes (Signed)
Patient was called twice and couldn't hear me either time. I will call again later.

## 2023-09-25 NOTE — Telephone Encounter (Signed)
 Patient has been notified

## 2023-09-25 NOTE — Progress Notes (Signed)
 Results have been relayed to the patient/authorized caretaker. The patient/authorized caretaker verbalized understanding. No questions at this time.

## 2023-12-03 ENCOUNTER — Other Ambulatory Visit: Payer: Self-pay | Admitting: Cardiology

## 2023-12-03 DIAGNOSIS — I10 Essential (primary) hypertension: Secondary | ICD-10-CM

## 2023-12-03 DIAGNOSIS — Z8679 Personal history of other diseases of the circulatory system: Secondary | ICD-10-CM

## 2023-12-19 ENCOUNTER — Other Ambulatory Visit: Payer: Self-pay | Admitting: Cardiology

## 2023-12-19 DIAGNOSIS — E7800 Pure hypercholesterolemia, unspecified: Secondary | ICD-10-CM

## 2023-12-20 ENCOUNTER — Ambulatory Visit: Payer: Medicare HMO | Admitting: Cardiology

## 2024-01-02 ENCOUNTER — Other Ambulatory Visit: Payer: Self-pay | Admitting: *Deleted

## 2024-01-02 DIAGNOSIS — Z720 Tobacco use: Secondary | ICD-10-CM

## 2024-01-02 DIAGNOSIS — I350 Nonrheumatic aortic (valve) stenosis: Secondary | ICD-10-CM

## 2024-01-03 ENCOUNTER — Other Ambulatory Visit: Payer: Medicare HMO

## 2024-01-03 DIAGNOSIS — E782 Mixed hyperlipidemia: Secondary | ICD-10-CM | POA: Diagnosis not present

## 2024-01-03 DIAGNOSIS — I350 Nonrheumatic aortic (valve) stenosis: Secondary | ICD-10-CM

## 2024-01-03 DIAGNOSIS — Z720 Tobacco use: Secondary | ICD-10-CM

## 2024-01-04 ENCOUNTER — Ambulatory Visit: Payer: Self-pay | Admitting: Family Medicine

## 2024-01-04 ENCOUNTER — Encounter: Payer: Self-pay | Admitting: Cardiology

## 2024-01-04 ENCOUNTER — Ambulatory Visit: Attending: Cardiology | Admitting: Cardiology

## 2024-01-04 VITALS — BP 134/81 | HR 88 | Ht 68.0 in | Wt 154.6 lb

## 2024-01-04 DIAGNOSIS — Z953 Presence of xenogenic heart valve: Secondary | ICD-10-CM | POA: Diagnosis not present

## 2024-01-04 DIAGNOSIS — F17200 Nicotine dependence, unspecified, uncomplicated: Secondary | ICD-10-CM

## 2024-01-04 DIAGNOSIS — I10 Essential (primary) hypertension: Secondary | ICD-10-CM | POA: Diagnosis not present

## 2024-01-04 DIAGNOSIS — T8203XD Leakage of heart valve prosthesis, subsequent encounter: Secondary | ICD-10-CM

## 2024-01-04 DIAGNOSIS — E7800 Pure hypercholesterolemia, unspecified: Secondary | ICD-10-CM

## 2024-01-04 LAB — LIPID PANEL
Chol/HDL Ratio: 2.1 ratio (ref 0.0–5.0)
Cholesterol, Total: 157 mg/dL (ref 100–199)
HDL: 76 mg/dL (ref >39)
LDL Chol Calc (NIH): 64 mg/dL (ref 0–99)
Triglycerides: 91 mg/dL (ref 0–149)
VLDL Cholesterol Cal: 17 mg/dL (ref 5–40)

## 2024-01-04 LAB — CBC WITH DIFFERENTIAL/PLATELET
Basophils Absolute: 0 10*3/uL (ref 0.0–0.2)
Basos: 0 %
EOS (ABSOLUTE): 0.1 10*3/uL (ref 0.0–0.4)
Eos: 2 %
Hematocrit: 47.5 % (ref 37.5–51.0)
Hemoglobin: 15.8 g/dL (ref 13.0–17.7)
Immature Grans (Abs): 0 10*3/uL (ref 0.0–0.1)
Immature Granulocytes: 0 %
Lymphocytes Absolute: 1.4 10*3/uL (ref 0.7–3.1)
Lymphs: 26 %
MCH: 31.3 pg (ref 26.6–33.0)
MCHC: 33.3 g/dL (ref 31.5–35.7)
MCV: 94 fL (ref 79–97)
Monocytes Absolute: 0.5 10*3/uL (ref 0.1–0.9)
Monocytes: 9 %
Neutrophils Absolute: 3.5 10*3/uL (ref 1.4–7.0)
Neutrophils: 63 %
Platelets: 234 10*3/uL (ref 150–450)
RBC: 5.05 x10E6/uL (ref 4.14–5.80)
RDW: 13 % (ref 11.6–15.4)
WBC: 5.6 10*3/uL (ref 3.4–10.8)

## 2024-01-04 LAB — COMPREHENSIVE METABOLIC PANEL WITH GFR
ALT: 15 IU/L (ref 0–44)
AST: 12 IU/L (ref 0–40)
Albumin: 4.7 g/dL (ref 3.8–4.8)
Alkaline Phosphatase: 66 IU/L (ref 44–121)
BUN/Creatinine Ratio: 11 (ref 10–24)
BUN: 12 mg/dL (ref 8–27)
Bilirubin Total: 0.4 mg/dL (ref 0.0–1.2)
CO2: 25 mmol/L (ref 20–29)
Calcium: 9.7 mg/dL (ref 8.6–10.2)
Chloride: 98 mmol/L (ref 96–106)
Creatinine, Ser: 1.07 mg/dL (ref 0.76–1.27)
Globulin, Total: 1.9 g/dL (ref 1.5–4.5)
Glucose: 89 mg/dL (ref 70–99)
Potassium: 4.9 mmol/L (ref 3.5–5.2)
Sodium: 139 mmol/L (ref 134–144)
Total Protein: 6.6 g/dL (ref 6.0–8.5)
eGFR: 72 mL/min/{1.73_m2} (ref >59)

## 2024-01-04 LAB — HEMOGLOBIN A1C
Est. average glucose Bld gHb Est-mCnc: 108 mg/dL
Hgb A1c MFr Bld: 5.4 % (ref 4.8–5.6)

## 2024-01-04 MED ORDER — LOSARTAN POTASSIUM 25 MG PO TABS
25.0000 mg | ORAL_TABLET | Freq: Every day | ORAL | 3 refills | Status: AC
Start: 2024-01-04 — End: ?

## 2024-01-04 NOTE — Progress Notes (Signed)
 Cardiology Office Note:  .   Date:  01/05/2024  ID:  Agnes Alderman Brodman, DOB 1946/04/02, MRN [id number] PCP: Laneta Pintos, MD  [facility] Providers Cardiologist:  Knox Perl, MD   History of Present Illness: .   Kurt Allen is a 78 y.o. Caucasian male with hypertension, tobacco use disorder and still smoking 5-10 cigarettes a day, severe aortic valve stenosis confirmed by cardiac catheterization on 02/17/2020 without significant coronary artery disease, a 5.1 cm ascending aortic aneurysm noted during low-dose CT scan of the chest for lung cancer screening, underwent bioprosthetic aortic valve replacement along with aortic root repair on 04/14/2020, surprisingly had normal coronary arteries during cardiac catheterization on 02/17/2020.  This is annual visit.   Discussed the use of AI scribe software for clinical note transcription with the patient, who gave verbal consent to proceed.  History of Present Illness Kurt Allen is a 78 year old male with aortic stenosis and hypertension who presents for a routine cardiovascular follow-up.  He experiences mild exertional dyspnea, becoming winded during yard work, without chest pain or leg cramping. A Bentall procedure with aortic root replacement and bioprosthetic valve placement was performed on April 14, 2020, with successful recovery. He smokes five cigarettes daily despite attempts to quit. Current medications include losartan  25 mg daily for hypertension and rosuvastatin  5 mg for cholesterol management, with no need for a refill.  Labs   Lab Results  Component Value Date   CHOL 157 01/03/2024   HDL 76 01/03/2024   LDLCALC 64 01/03/2024   LDLDIRECT 63 06/29/2021   TRIG 91 01/03/2024   CHOLHDL 2.1 01/03/2024   Lab Results  Component Value Date   NA 139 01/03/2024   K 4.9 01/03/2024   CO2 25 01/03/2024   GLUCOSE 89 01/03/2024   BUN 12 01/03/2024   CREATININE 1.07 01/03/2024   CALCIUM  9.7 01/03/2024   EGFR  72 01/03/2024   GFRNONAA >60 04/20/2020      Latest Ref Rng & Units 01/03/2024    9:52 AM 12/25/2022    8:47 AM 06/29/2021   10:16 AM  BMP  Glucose 70 - 99 mg/dL 89  83  88   BUN 8 - 27 mg/dL 12  12  9    Creatinine 0.76 - 1.27 mg/dL 8.29  5.62  1.30   BUN/Creat Ratio 10 - 24 11  11  9    Sodium 134 - 144 mmol/L 139  137  140   Potassium 3.5 - 5.2 mmol/L 4.9  4.8  4.3   Chloride 96 - 106 mmol/L 98  99  100   CO2 20 - 29 mmol/L 25  25  26    Calcium  8.6 - 10.2 mg/dL 9.7  9.5  9.5       Latest Ref Rng & Units 01/03/2024    9:52 AM 12/25/2022    8:47 AM 06/29/2021   10:16 AM  CBC  WBC 3.4 - 10.8 x10E3/uL 5.6  7.5  6.5   Hemoglobin 13.0 - 17.7 g/dL 86.5  78.4  69.6   Hematocrit 37.5 - 51.0 % 47.5  49.9  48.3   Platelets 150 - 450 x10E3/uL 234  249  269    Lab Results  Component Value Date   HGBA1C 5.4 01/03/2024    No results found for: "TSH"   ROS  Review of Systems  Cardiovascular:  Positive for dyspnea on exertion. Negative for chest pain and leg swelling.    Physical Exam:   VS:  BP 134/81 (BP Location: Right Arm, Patient Position: Sitting, Cuff Size: Normal)   Pulse 88   Ht 5\' 8"  (1.727 m)   Wt 154 lb 9.6 oz (70.1 kg)   SpO2 97%   BMI 23.51 kg/m    Wt Readings from Last 3 Encounters:  01/04/24 154 lb 9.6 oz (70.1 kg)  09/12/23 151 lb 1.9 oz (68.5 kg)  08/08/23 145 lb 4.8 oz (65.9 kg)    Physical Exam Neck:     Vascular: No JVD.  Cardiovascular:     Rate and Rhythm: Normal rate and regular rhythm.     Pulses: Intact distal pulses.     Heart sounds: S1 normal and S2 normal. Murmur heard.     Early systolic murmur is present with a grade of 2/6 at the upper right sternal border.     No gallop.  Pulmonary:     Effort: Pulmonary effort is normal.     Breath sounds: Normal breath sounds.  Abdominal:     General: Bowel sounds are normal.     Palpations: Abdomen is soft.  Musculoskeletal:     Right lower leg: No edema.     Left lower leg: No edema.    Studies  Reviewed: .    Echocardiogram 12/12/2022: Normal LV systolic function with visual EF 60-65%. Left ventricle cavity is normal in size. Normal left ventricular wall thickness. Normal global wall motion. Normal diastolic filling pattern, normal LAP. Calculated EF 69%. Bioprosthetic trileaflet aortic valve.  Mild (Grade I) aortic regurgitation. Aortic root and ascending aorta normal at 3.5 cm. Structurally normal tricuspid valve with trace regurgitation. No evidence of pulmonary hypertension. Compared to 08/31/2020, aortic regurgitation appears to be less prominent, previously mild to moderate. EKG:    EKG Interpretation Date/Time:  Friday Jan 04 2024 14:32:16 EDT Ventricular Rate:  82 PR Interval:  146 QRS Duration:  94 QT Interval:  364 QTC Calculation: 425 R Axis:   88  Text Interpretation: EKG 01/04/2024: Normal sinus rhythm at rate of 82 bpm, normal EKG. Confirmed by Chastidy Ranker, Jagadeesh ([id number]) on 01/04/2024 2:53:59 PM    Medications and allergies    No Known Allergies   Current Outpatient Medications:    acetaminophen  (TYLENOL ) 325 MG tablet, Take 2 tablets (650 mg total) by mouth every 6 (six) hours as needed for mild pain., Disp: , Rfl:    albuterol  (VENTOLIN  HFA) 108 (90 Base) MCG/ACT inhaler, Inhale 1 puff into the lungs every 4 (four) hours as needed for wheezing or shortness of breath., Disp: 18 g, Rfl: 5   aspirin  EC 81 MG EC tablet, Take 1 tablet (81 mg total) by mouth daily. Swallow whole., Disp: , Rfl:    buPROPion  (WELLBUTRIN  SR) 150 MG 12 hr tablet, Take 1 tablet (150 mg total) by mouth 2 (two) times daily., Disp: 180 tablet, Rfl: 1   Cholecalciferol (VITAMIN D3) 75 MCG (3000 UT) TABS, Take 1 tablet by mouth daily., Disp: 90 tablet, Rfl: 3   fluticasone  (FLONASE ) 50 MCG/ACT nasal spray, Place 1 spray into both nostrils daily as needed for rhinitis., Disp: 16 g, Rfl: 3   magnesium  oxide (MAG-OX) 400 (241.3 Mg) MG tablet, Take 1 tablet (400 mg total) by mouth 2 (two) times  daily., Disp: 30 tablet, Rfl: 0   Multiple Vitamin (MULTIVITAMIN) tablet, Take 1 tablet by mouth daily., Disp: , Rfl:    rosuvastatin  (CRESTOR ) 5 MG tablet, TAKE 1 TABLET EVERY DAY, Disp: 90 tablet, Rfl: 3   tamsulosin  (FLOMAX ) 0.4 MG  CAPS capsule, Take 1 capsule (0.4 mg total) by mouth daily., Disp: 90 capsule, Rfl: 3   losartan  (COZAAR ) 25 MG tablet, Take 1 tablet (25 mg total) by mouth daily., Disp: 90 tablet, Rfl: 3   Meds ordered this encounter  Medications   losartan  (COZAAR ) 25 MG tablet    Sig: Take 1 tablet (25 mg total) by mouth daily.    Dispense:  90 tablet    Refill:  3    Pt must keep upcoming followup appt with Cardiology in May 2025 for any more refills. Thank You     Medications Discontinued During This Encounter  Medication Reason   losartan  (COZAAR ) 25 MG tablet Reorder     ASSESSMENT AND PLAN: .      ICD-10-CM   1. Primary hypertension  I10 EKG 12-Lead    losartan  (COZAAR ) 25 MG tablet    2. 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 Perivalvular leak of prosthetic heart valve, subsequent encounter  T82.03XD     4. Pure hypercholesterolemia  E78.00     5. Tobacco use disorder  F17.200       Assessment and Plan Assessment & Plan Aortic Valve Replacement and Aneurysm Repair   He underwent a BENTAL procedure in August 2021. The current bioprosthetic valve shows minor leakage, which is not clinically significant. No echocardiogram is needed this year. Consider an echocardiogram next year. Advise antibiotics before dental procedures.  Do not think he needs repeat echocardiogram in the absence of symptoms and CBC reviewed, no hemolysis.  Primary Hypertension   His hypertension is managed with losartan  25 mg daily, and his blood pressure is well-controlled. No new symptoms are reported. Prescribe losartan  25 mg daily and send the prescription to [facility]. With her to  hypercholesterolemia, on minimal dose of Crestor  at 5 mg, lipids under excellent control.  Renal function is normal, electrolytes are normal.  BMP reviewed.  Tobacco Use   He continues to smoke despite multiple cessation attempts and acknowledges difficulty in quitting. Encourage smoking cessation efforts.   Signed,  Knox Perl, MD, [facility] 01/05/2024, 7:51 AM [facility] [address] [place], [place] [address] Phone: [phone]. Fax:  [phone]

## 2024-01-04 NOTE — Patient Instructions (Signed)
 Medication Instructions:  Your physician recommends that you continue on your current medications as directed. Please refer to the Current Medication list given to you today.  *If you need a refill on your cardiac medications before your next appointment, please call your pharmacy*  Follow-Up: At [facility], you and your health needs are our priority.  As part of our continuing mission to provide you with exceptional heart care, our providers are all part of one team.  This team includes your primary Cardiologist (physician) and Advanced Practice Providers or APPs (Physician Assistants and Nurse Practitioners) who all work together to provide you with the care you need, when you need it.  Your next appointment:   1 year(s)  The format for your next appointment:   In Person  Provider:   Knox Perl, MD{  We recommend signing up for the patient portal called "MyChart".  Sign up information is provided on this After Visit Summary.  MyChart is used to connect with patients for Virtual Visits (Telemedicine).  Patients are able to view lab/test results, encounter notes, upcoming appointments, etc.  Non-urgent messages can be sent to your provider as well.   To learn more about what you can do with MyChart, go to [email or web address].

## 2024-01-10 ENCOUNTER — Encounter: Payer: Self-pay | Admitting: Family Medicine

## 2024-01-10 ENCOUNTER — Ambulatory Visit (INDEPENDENT_AMBULATORY_CARE_PROVIDER_SITE_OTHER): Payer: Medicare HMO | Admitting: Family Medicine

## 2024-01-10 VITALS — BP 139/88 | HR 78 | Ht 68.0 in | Wt 153.1 lb

## 2024-01-10 DIAGNOSIS — Z0000 Encounter for general adult medical examination without abnormal findings: Secondary | ICD-10-CM

## 2024-01-10 DIAGNOSIS — Z720 Tobacco use: Secondary | ICD-10-CM | POA: Diagnosis not present

## 2024-01-10 DIAGNOSIS — R251 Tremor, unspecified: Secondary | ICD-10-CM | POA: Diagnosis not present

## 2024-01-10 DIAGNOSIS — J432 Centrilobular emphysema: Secondary | ICD-10-CM | POA: Diagnosis not present

## 2024-01-10 NOTE — Patient Instructions (Signed)
 It was nice to see you today,  We addressed the following topics today: -Medication for symptomatic relief of essential tremors is a beta-blocker.  You were previously on a beta-blocker but it was stopped due to concerns of bradycardia.  Therefore I will not restart it at this time, but if you feel like you want to try the other medication, primidone, for symptomatic relief I can send a referral to the neurologist. - Please try to cut out cigarette smoking completely.  You can continue to take the bupropion  open after you quit smoking. - Your labs all look good.  No changes to your medications.  Have a great day,  Etha Henle, MD

## 2024-01-10 NOTE — Assessment & Plan Note (Signed)
 Exam more consistent with essential tremor than resting tremor or Parkinson's.  Bilateral in the hands, but left side appears more pronounced than right side.  No vocal tremor.  Patient has not tolerated beta-blockers in the past due to bradycardia.  Symptoms are not interfering with his ADLs at the moment.  Advised him if they worsen, we can send in referral to neurology, but hesitant to restart beta-blocker given his previous intolerance.

## 2024-01-10 NOTE — Assessment & Plan Note (Signed)
 Still smoking a few cigarettes a day.  Still taking bupropion .  Encouraged him to try and quit completely.

## 2024-01-10 NOTE — Progress Notes (Unsigned)
   Annual physical  Subjective   Patient ID: Kurt Allen, male    DOB: Jun 14, 1946  Age: 78 y.o. MRN: [id number]  Chief Complaint  Patient presents with   Annual Exam   HPI Kurt Allen is a 78 y.o. old male here  for annual exam.   Subjective - Left hand tremor, worse when grabbing objects, improves with pressure (squeezing steering wheel) or resting arm on leg - Right hand also has mild tremor, but left is more noticeable  Medications: aspirin , magnesium  supplement, bupropion , vitamin D, Flonase  nasal spray (used occasionally), losartan , rosuvastatin , tamsulosin   PMH, PSH, FH, Social Hx: Hiatal hernia with occasional reflux symptoms, takes antacids as needed. Smoking 5-6 cigarettes daily, previously tried nicotine  patch without success, gum not helpful. Alcohol consumption 1-3 beers daily, typically 1-2.  ROS: Denies tremor in legs  The 10-year ASCVD risk score (Arnett DK, et al., 2019) is: 30%  Health Maintenance Due  Topic Date Due   Hepatitis C Screening  Never done   DTaP/Tdap/Td (1 - Tdap) Never done   Zoster Vaccines- Shingrix (1 of 2) Never done   COVID-19 Vaccine (4 - 2024-25 season) 04/22/2023      Objective:     BP 139/88   Pulse 78   Ht 5\' 8"  (1.727 m)   Wt 153 lb 1.9 oz (69.5 kg)   SpO2 97%   BMI 23.28 kg/m  {Vitals History (Optional):23777}  Physical Exam General: Alert, oriented CV: Regular rate and rhythm Pulmonary: Lungs clear bilaterally MSK: Strength good bilaterally Neuro: Bilateral kinetic tremors in the hands.  No resting tremor.  No cogwheel rigidity.  Normal gait.  Normal finger tapping and toe tapping bilaterally.   No results found for any visits on 01/10/24.      Assessment & Plan:   Tremor Assessment & Plan: Exam more consistent with essential tremor than resting tremor or Parkinson's.  Bilateral in the hands, but left side appears more pronounced than right side.  No vocal tremor.  Patient has not tolerated beta-blockers in  the past due to bradycardia.  Symptoms are not interfering with his ADLs at the moment.  Advised him if they worsen, we can send in referral to neurology, but hesitant to restart beta-blocker given his previous intolerance.   Tobacco abuse Assessment & Plan: Still smoking a few cigarettes a day.  Still taking bupropion .  Encouraged him to try and quit completely.   Centrilobular emphysema (HCC) Assessment & Plan: Encouraged smoking cessation.      Return in about 6 months (around 07/12/2024) for tremor.    Laneta Pintos, MD

## 2024-01-10 NOTE — Assessment & Plan Note (Signed)
 Encouraged smoking cessation

## 2024-03-19 DIAGNOSIS — H40023 Open angle with borderline findings, high risk, bilateral: Secondary | ICD-10-CM | POA: Diagnosis not present

## 2024-03-19 DIAGNOSIS — H1789 Other corneal scars and opacities: Secondary | ICD-10-CM | POA: Diagnosis not present

## 2024-07-14 ENCOUNTER — Encounter: Payer: Self-pay | Admitting: Family Medicine

## 2024-07-14 ENCOUNTER — Ambulatory Visit: Admitting: Family Medicine

## 2024-07-14 VITALS — BP 107/68 | HR 75 | Ht 68.0 in | Wt 144.0 lb

## 2024-07-14 DIAGNOSIS — I350 Nonrheumatic aortic (valve) stenosis: Secondary | ICD-10-CM

## 2024-07-14 DIAGNOSIS — Z720 Tobacco use: Secondary | ICD-10-CM

## 2024-07-14 DIAGNOSIS — Z23 Encounter for immunization: Secondary | ICD-10-CM

## 2024-07-14 DIAGNOSIS — E782 Mixed hyperlipidemia: Secondary | ICD-10-CM

## 2024-07-14 DIAGNOSIS — Z7185 Encounter for immunization safety counseling: Secondary | ICD-10-CM

## 2024-07-14 DIAGNOSIS — I10 Essential (primary) hypertension: Secondary | ICD-10-CM | POA: Diagnosis not present

## 2024-07-14 DIAGNOSIS — R251 Tremor, unspecified: Secondary | ICD-10-CM

## 2024-07-14 MED ORDER — BUPROPION HCL ER (SR) 150 MG PO TB12: 150.0000 mg | ORAL_TABLET | Freq: Two times a day (BID) | ORAL | 3 refills | Status: AC

## 2024-07-14 NOTE — Patient Instructions (Signed)
 It was nice to see you today,  We addressed the following topics today: - I have restarted your Wellbutrin . Please take it once a day for two weeks, and then you can increase to twice a day if you want to. - I recommend you get the Tdap (tetanus, diphtheria, pertussis), updated COVID, and RSV vaccines at a pharmacy. You can get them at the same time, but you may want to space them out to feel better. - Please schedule your 59-month follow-up, which will be your annual physical, for around May.  Have a great day,  Rolan Slain, MD

## 2024-07-14 NOTE — Progress Notes (Signed)
 Established Patient Office Visit  Subjective   Patient ID: Kurt Allen, male    DOB: 1945/10/19  Age: 78 y.o. MRN: [id number]  Chief Complaint  Patient presents with   Medical Management of Chronic Issues    HPI  Subjective - Tremor: Reports left-sided tremor is improving. Has only noticed it once in the last couple of weeks. No changes in routine other than driving less.  - Smoking Cessation: Ran out of bupropion  about a month ago. Wishes to restart it to aid with smoking cessation. Was taking it twice a day, but reduced to once a day when running low.  - Health Maintenance: Inquires about recommended vaccinations.  Medications Reports taking rosuvastatin  for cholesterol, Flomax , and a blood pressure medication (unspecified). Bupropion  was taken previously for smoking cessation and will be restarted.  PMH, PSH, FH, Social Hx PMHx: Tremor, hypercholesterolemia, hypertension. History of lung nodules, which were noted to be decreased or stable on prior CT scan  Social Hx: Smokes 10-15 cigarettes per day.   The 10-year ASCVD risk score (Arnett DK, et al., 2019) is: 21.1%  Health Maintenance Due  Topic Date Due   Hepatitis C Screening  Never done   DTaP/Tdap/Td (1 - Tdap) Never done   Zoster Vaccines- Shingrix (1 of 2) Never done   COVID-19 Vaccine (4 - 2025-26 season) 04/21/2024   Medicare Annual Wellness (AWV)  08/19/2024      Objective:     BP 107/68   Pulse 75   Ht 5' 8 (1.727 m)   Wt 144 lb (65.3 kg)   SpO2 100%   BMI 21.90 kg/m    Physical Exam Gen: alert, oriented Pulm: no respiratory distress Neuro: no tremor noted Psych: pleasant affect   No results found for any visits on 07/14/24.      Assessment & Plan:   Tremor Assessment & Plan: Follow-up on left-sided tremor. Reports significant improvement with near-resolution of symptoms. No clear etiology for improvement identified. - Continue to monitor.  Orders: -     TSH; Future -      Hemoglobin A1c; Future  Tobacco abuse Assessment & Plan: Currently smokes 10-15 cigarettes per day. Previously used bupropion  with some success and expresses desire to try again. - Restart bupropion  (Wellbutrin ) 150 mg. Take one tablet daily for 2 weeks, then may increase to twice daily as tolerated. Sent prescription to [facility].  Orders: -     buPROPion  HCl ER (SR); Take 1 tablet (150 mg total) by mouth 2 (two) times daily.  Dispense: 180 tablet; Refill: 3 -     Comprehensive metabolic panel with GFR; Future -     CBC with Differential/Platelet; Future  Vaccine counseling Assessment & Plan: - Flu vaccine administered today. - Recommended obtaining Tdap, COVID, and RSV vaccines from pharmacy. Discussed rationale for each, including protection against respiratory illnesses. - Follow-up in 6 months for annual physical exam (approximately May 2026).   Severe aortic stenosis -     Lipid panel; Future  Mixed hyperlipidemia -     Comprehensive metabolic panel with GFR; Future -     Lipid panel; Future -     Hemoglobin A1c; Future  Primary hypertension -     Comprehensive metabolic panel with GFR; Future -     CBC with Differential/Platelet; Future -     TSH; Future -     Hemoglobin A1c; Future  Other orders -     Flu vaccine HIGH DOSE PF(Fluzone Trivalent)  Return in about 6 months (around 01/11/2025) for physical.    Kurt MARLA Slain, MD

## 2024-07-21 DIAGNOSIS — I10 Essential (primary) hypertension: Secondary | ICD-10-CM | POA: Insufficient documentation

## 2024-07-21 DIAGNOSIS — E785 Hyperlipidemia, unspecified: Secondary | ICD-10-CM | POA: Insufficient documentation

## 2024-07-21 DIAGNOSIS — Z7185 Encounter for immunization safety counseling: Secondary | ICD-10-CM | POA: Insufficient documentation

## 2024-07-21 NOTE — Assessment & Plan Note (Signed)
 Follow-up on left-sided tremor. Reports significant improvement with near-resolution of symptoms. No clear etiology for improvement identified. - Continue to monitor.

## 2024-07-21 NOTE — Assessment & Plan Note (Signed)
 Currently smokes 10-15 cigarettes per day. Previously used bupropion  with some success and expresses desire to try again. - Restart bupropion  (Wellbutrin ) 150 mg. Take one tablet daily for 2 weeks, then may increase to twice daily as tolerated. Sent prescription to [facility].

## 2024-07-21 NOTE — Assessment & Plan Note (Signed)
-   Flu vaccine administered today. - Recommended obtaining Tdap, COVID, and RSV vaccines from pharmacy. Discussed rationale for each, including protection against respiratory illnesses. - Follow-up in 6 months for annual physical exam (approximately May 2026).

## 2024-09-11 ENCOUNTER — Ambulatory Visit: Payer: Medicare HMO

## 2024-09-11 VITALS — Ht 68.0 in | Wt 144.0 lb

## 2024-09-11 DIAGNOSIS — Z0000 Encounter for general adult medical examination without abnormal findings: Secondary | ICD-10-CM

## 2024-09-11 DIAGNOSIS — Z1159 Encounter for screening for other viral diseases: Secondary | ICD-10-CM

## 2024-09-11 DIAGNOSIS — F17200 Nicotine dependence, unspecified, uncomplicated: Secondary | ICD-10-CM

## 2024-09-11 DIAGNOSIS — Z122 Encounter for screening for malignant neoplasm of respiratory organs: Secondary | ICD-10-CM

## 2024-09-11 NOTE — Progress Notes (Signed)
 "  Chief Complaint  Patient presents with   Medicare Wellness     Subjective:   Kurt Allen is a 79 y.o. male who presents for a Medicare Annual Wellness Visit.  Visit info / Clinical Intake: Medicare Wellness Visit Type:: Subsequent Annual Wellness Visit Persons participating in visit and providing information:: patient Medicare Wellness Visit Mode:: Video Since this visit was completed virtually, some vitals may be partially provided or unavailable. Missing vitals are due to the limitations of the virtual format.: Documented vitals are patient reported If Telephone or Video please confirm:: I connected with patient using audio/video enable telemedicine. I verified patient identity with two identifiers, discussed telehealth limitations, and patient agreed to proceed. Patient Location:: Home Provider Location:: Office Interpreter Needed?: No Pre-visit prep was completed: yes AWV questionnaire completed by patient prior to visit?: no Living arrangements:: lives with spouse/significant other Patient's Overall Health Status Rating: good Typical amount of pain: none Does pain affect daily life?: no Are you currently prescribed opioids?: no  Dietary Habits and Nutritional Risks How many meals a day?: 2 (2-3) Eats fruit and vegetables daily?: yes Most meals are obtained by: preparing own meals; eating out In the last 2 weeks, have you had any of the following?: none Diabetic:: no  Functional Status Activities of Daily Living (to include ambulation/medication): Independent Ambulation: Independent with device- listed below Home Assistive Devices/Equipment: Eyeglasses; Dentures (specify type) Medication Administration: Independent Home Management (perform basic housework or laundry): Independent Manage your own finances?: yes Primary transportation is: driving Concerns about vision?: no *vision screening is required for WTM* Concerns about hearing?: no  Fall  Screening Falls in the past year?: 0 Number of falls in past year: 0 Was there an injury with Fall?: 0 Fall Risk Category Calculator: 0 Patient Fall Risk Level: Low Fall Risk  Fall Risk Patient at Risk for Falls Due to: No Fall Risks Fall risk Follow up: Falls evaluation completed; Falls prevention discussed  Home and Transportation Safety: All rugs have non-skid backing?: N/A, no rugs All stairs or steps have railings?: yes (outside) Grab bars in the bathtub or shower?: (!) no Have non-skid surface in bathtub or shower?: yes Good home lighting?: yes Regular seat belt use?: yes Hospital stays in the last year:: no  Cognitive Assessment Difficulty concentrating, remembering, or making decisions? : no Will 6CIT or Mini Cog be Completed: yes What year is it?: 0 points What month is it?: 0 points Give patient an address phrase to remember (5 components): [address] [employer] what time is it?: 0 points Count backwards from 20 to 1: 0 points Say the months of the year in reverse: 0 points Repeat the address phrase from earlier: 0 points 6 CIT Score: 0 points  Advance Directives (For Healthcare) Does Patient Have a Medical Advance Directive?: No Would patient like information on creating a medical advance directive?: No - Patient declined  Reviewed/Updated  Reviewed/Updated: Reviewed All (Medical, Surgical, Family, Medications, Allergies, Care Teams, Patient Goals)    Allergies (verified) Patient has no known allergies.   Current Medications (verified) Outpatient Encounter Medications as of 09/11/2024  Medication Sig   acetaminophen  (TYLENOL ) 325 MG tablet Take 2 tablets (650 mg total) by mouth every 6 (six) hours as needed for mild pain.   albuterol  (VENTOLIN  HFA) 108 (90 Base) MCG/ACT inhaler Inhale 1 puff into the lungs every 4 (four) hours as needed for wheezing or shortness of breath.   aspirin  EC 81 MG EC tablet Take 1 tablet (  81 mg total) by mouth daily.  Swallow whole.   buPROPion  (WELLBUTRIN  SR) 150 MG 12 hr tablet Take 1 tablet (150 mg total) by mouth 2 (two) times daily.   Cholecalciferol (VITAMIN D3) 75 MCG (3000 UT) TABS Take 1 tablet by mouth daily.   fluticasone  (FLONASE ) 50 MCG/ACT nasal spray Place 1 spray into both nostrils daily as needed for rhinitis.   losartan  (COZAAR ) 25 MG tablet Take 1 tablet (25 mg total) by mouth daily.   magnesium  oxide (MAG-OX) 400 (241.3 Mg) MG tablet Take 1 tablet (400 mg total) by mouth 2 (two) times daily.   Multiple Vitamin (MULTIVITAMIN) tablet Take 1 tablet by mouth daily.   rosuvastatin  (CRESTOR ) 5 MG tablet TAKE 1 TABLET EVERY DAY   tamsulosin  (FLOMAX ) 0.4 MG CAPS capsule Take 1 capsule (0.4 mg total) by mouth daily.   No facility-administered encounter medications on file as of 09/11/2024.    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   25 mm Edwards Konect Resilia stented bovine pericardial valve synthetic root conduit with reimplantation of left main and right coronary arteries   Severe aortic stenosis    Thoracic ascending aortic aneurysm    Tobacco abuse    Past Surgical History:  Procedure Laterality Date   AORTIC VALVE REPLACEMENT N/A 04/14/2020   Procedure: AORTIC VALVE REPLACEMENT (AVR);  Surgeon: Dusty Sudie DEL, MD;  Location: [facility] OR;  Service: Open Heart Surgery;  Laterality: N/A;   BENTALL PROCEDURE N/A 04/14/2020   Procedure: BENTALL AORTIC ROOT REPLACEMENT USING KONECT RESILIA AORTIC VALVED CONDUIT WITH REIMPLANTATION OF RIGHT AND LEFT CORONARY BUTTONS;  Surgeon: Dusty Sudie DEL, MD;  Location: [facility] OR;  Service: Open Heart Surgery;  Laterality: N/A;   CARDIAC CATHETERIZATION     EYE SURGERY Bilateral    cataracts   RIGHT HEART CATH AND CORONARY ANGIOGRAPHY N/A 02/17/2020    Procedure: RIGHT HEART CATH AND CORONARY ANGIOGRAPHY;  Surgeon: Ladona Heinz, MD;  Location: [facility] INVASIVE CV LAB;  Service: Cardiovascular;  Laterality: N/A;   TEE WITHOUT CARDIOVERSION N/A 04/14/2020   Procedure: TRANSESOPHAGEAL ECHOCARDIOGRAM (TEE);  Surgeon: Dusty Sudie DEL, MD;  Location: [facility] OR;  Service: Open Heart Surgery;  Laterality: N/A;   THORACIC AORTIC ANEURYSM REPAIR N/A 04/14/2020   Procedure: THORACIC ASCENDING ANEURYSM REPAIR (AAA);  Surgeon: Dusty Sudie DEL, MD;  Location: [facility] OR;  Service: Open Heart Surgery;  Laterality: N/A;   History reviewed. No pertinent family history. Social History   Occupational History   Occupation: Retired  Tobacco Use   Smoking status: Some Days    Current packs/day: 0.50    Average packs/day: 0.5 packs/day for 57.1 years (28.5 ttl pk-yrs)    Types: Cigarettes    Start date: 08/22/1967    Passive exposure: Past   Smokeless tobacco: Never   Tobacco comments:    smokes 1 in a week.  As of 02/01/23 BT   Vaping Use   Vaping status: Former   Start date: 08/21/2016   Quit date: 09/21/2016  Substance and Sexual Activity   Alcohol use: Yes    Alcohol/week: 10.0 standard drinks of alcohol    Types: 10 Cans of beer per week    Comment: per week   Drug use: Never   Sexual activity: Yes   Tobacco Counseling Ready to  quit: No Counseling given: Yes Tobacco comments: smokes 1 in a week.  As of 02/01/23 BT   SDOH Screenings   Food Insecurity: No Food Insecurity (09/11/2024)  Housing: Unknown (09/11/2024)  Transportation Needs: No Transportation Needs (09/11/2024)  Utilities: Not At Risk (09/11/2024)  Alcohol Screen: Low Risk (08/20/2023)  Depression (PHQ2-9): Low Risk (09/11/2024)  Financial Resource Strain: Low Risk (08/20/2023)  Physical Activity: Inactive (09/11/2024)  Social Connections: Socially Isolated (09/11/2024)  Stress: No Stress Concern Present (09/11/2024)  Tobacco Use: High Risk (09/11/2024)  Health Literacy: Adequate Health Literacy  (09/11/2024)   See flowsheets for full screening details  Depression Screen PHQ 2 & 9 Depression Scale- Over the past 2 weeks, how often have you been bothered by any of the following problems? Little interest or pleasure in doing things: 0 Feeling down, depressed, or hopeless (PHQ Adolescent also includes...irritable): 0 PHQ-2 Total Score: 0 Trouble falling or staying asleep, or sleeping too much: 0 Feeling tired or having little energy: 0 Poor appetite or overeating (PHQ Adolescent also includes...weight loss): 0 Feeling bad about yourself - or that you are a failure or have let yourself or your family down: 0 Trouble concentrating on things, such as reading the newspaper or watching television (PHQ Adolescent also includes...like school work): 0 Moving or speaking so slowly that other people could have noticed. Or the opposite - being so fidgety or restless that you have been moving around a lot more than usual: 0 Thoughts that you would be better off dead, or of hurting yourself in some way: 0 PHQ-9 Total Score: 0 If you checked off any problems, how difficult have these problems made it for you to do your work, take care of things at home, or get along with other people?: Not difficult at all  Depression Treatment Depression Interventions/Treatment : EYV7-0 Score <4 Follow-up Not Indicated; Medication; Currently on Treatment     Goals Addressed               This Visit's Progress     Patient Stated (pt-stated)        Patient stated he plans to start being more active - walking             Objective:    Today's Vitals   09/11/24 1048  Weight: 144 lb (65.3 kg)  Height: 5' 8 (1.727 m)   Body mass index is 21.9 kg/m.  Hearing/Vision screen Hearing Screening - Comments:: Denies hearing difficulties   Vision Screening - Comments:: Wears rx glasses - up to date with routine eye exams with Ozell Bertin Immunizations and Health Maintenance Health Maintenance   Topic Date Due   Hepatitis C Screening  Never done   DTaP/Tdap/Td (1 - Tdap) Never done   Zoster Vaccines- Shingrix (1 of 2) Never done   COVID-19 Vaccine (4 - 2025-26 season) 04/21/2024   Lung Cancer Screening  09/12/2024   Medicare Annual Wellness (AWV)  09/11/2025   Pneumococcal Vaccine: 50+ Years  Completed   Influenza Vaccine  Completed   Meningococcal B Vaccine  Aged Out        Assessment/Plan:  This is a routine wellness examination for Kurt Allen.  Lung Cancer Screening: ordered today Hepatitis C Screening: ordered today  Patient Care Team: Chandra Toribio POUR, MD as PCP - General (Family Medicine) Ladona Heinz, MD as PCP - Cardiology (Cardiology) Bertin Ozell, MD as Consulting Physician (Ophthalmology)  I have personally reviewed and noted the following in the patients chart:   Medical and social  history Use of alcohol, tobacco or illicit drugs  Current medications and supplements including opioid prescriptions. Functional ability and status Nutritional status Physical activity Advanced directives List of other physicians Hospitalizations, surgeries, and ER visits in previous 12 months Vitals Screenings to include cognitive, depression, and falls Referrals and appointments  Orders Placed This Encounter  Procedures   CT CHEST LUNG CANCER SCREENING LOW DOSE WO CONTRAST    Standing Status:   Future    Expiration Date:   09/11/2025    Preferred Imaging Location?:   [facility]    Release to patient:   Immediate [1]   Hepatitis C antibody    Standing Status:   Future    Expiration Date:   09/11/2025   In addition, I have reviewed and discussed with patient certain preventive protocols, quality metrics, and best practice recommendations. A written personalized care plan for preventive services as well as general preventive health recommendations were provided to patient.   Kurt Allen CHRISTELLA Saba, CMA   09/11/2024   Return in 1 year (on 09/11/2025).  After Visit  Summary: (MyChart) Due to this being a telephonic visit, the after visit summary with patients personalized plan was offered to patient via MyChart   Nurse Notes: scheduled 2027 AWV appt "

## 2024-09-11 NOTE — Patient Instructions (Signed)
 Kurt Allen,  Thank you for taking the time for your Medicare Wellness Visit. I appreciate your continued commitment to your health goals. Please review the care plan we discussed, and feel free to reach out if I can assist you further.  Please note that Annual Wellness Visits do not include a physical exam. Some assessments may be limited, especially if the visit was conducted virtually. If needed, we may recommend an in-person follow-up with your provider.  Ongoing Care Seeing your primary care provider every 3 to 6 months helps us  monitor your health and provide consistent, personalized care.   Referrals If a referral was made during today's visit and you haven't received any updates within two weeks, please contact the referred provider directly to check on the status.  Recommended Screenings:  Health Maintenance  Topic Date Due   Hepatitis C Screening  Never done   DTaP/Tdap/Td vaccine (1 - Tdap) Never done   Zoster (Shingles) Vaccine (1 of 2) Never done   COVID-19 Vaccine (4 - 2025-26 season) 04/21/2024   Screening for Lung Cancer  09/12/2024   Medicare Annual Wellness Visit  09/11/2025   Pneumococcal Vaccine for age over 56  Completed   Flu Shot  Completed   Meningitis B Vaccine  Aged Out       08/20/2023   10:46 AM  Advanced Directives  Does Patient Have a Medical Advance Directive? No    Vision: Annual vision screenings are recommended for early detection of glaucoma, cataracts, and diabetic retinopathy. These exams can also reveal signs of chronic conditions such as diabetes and high blood pressure.  Dental: Annual dental screenings help detect early signs of oral cancer, gum disease, and other conditions linked to overall health, including heart disease and diabetes.

## 2024-09-18 ENCOUNTER — Ambulatory Visit (HOSPITAL_BASED_OUTPATIENT_CLINIC_OR_DEPARTMENT_OTHER)
Admission: RE | Admit: 2024-09-18 | Discharge: 2024-09-18 | Disposition: A | Discharge status: Home / Self Care | Source: Ambulatory Visit | Attending: Family Medicine | Admitting: Family Medicine

## 2024-09-18 DIAGNOSIS — D3501 Benign neoplasm of right adrenal gland: Secondary | ICD-10-CM | POA: Insufficient documentation

## 2024-09-18 DIAGNOSIS — Z122 Encounter for screening for malignant neoplasm of respiratory organs: Secondary | ICD-10-CM | POA: Diagnosis present

## 2024-09-18 DIAGNOSIS — F17210 Nicotine dependence, cigarettes, uncomplicated: Secondary | ICD-10-CM | POA: Insufficient documentation

## 2024-09-18 DIAGNOSIS — D3502 Benign neoplasm of left adrenal gland: Secondary | ICD-10-CM | POA: Diagnosis not present

## 2024-09-18 DIAGNOSIS — R911 Solitary pulmonary nodule: Secondary | ICD-10-CM | POA: Diagnosis not present

## 2024-09-18 DIAGNOSIS — I7121 Aneurysm of the ascending aorta, without rupture: Secondary | ICD-10-CM | POA: Insufficient documentation

## 2024-09-18 DIAGNOSIS — J439 Emphysema, unspecified: Secondary | ICD-10-CM | POA: Diagnosis not present

## 2024-09-18 DIAGNOSIS — F17200 Nicotine dependence, unspecified, uncomplicated: Secondary | ICD-10-CM | POA: Diagnosis present

## 2024-09-22 ENCOUNTER — Telehealth: Payer: Self-pay

## 2024-09-22 NOTE — Telephone Encounter (Signed)
 Copied from CRM [id number]. Topic: Clinical - Lab/Test Results >> Sep 22, 2024  3:34 PM Amber H wrote: Reason for CRM: Diane with [facility] calling to confirm patients CT Lung was in file. Advised her yes. She is requesting a high priority message is sent to provider for results.   Diane- [phone]

## 2024-09-23 ENCOUNTER — Ambulatory Visit: Payer: Self-pay | Admitting: Family Medicine

## 2024-09-23 ENCOUNTER — Other Ambulatory Visit: Payer: Self-pay | Admitting: Family Medicine

## 2024-09-23 DIAGNOSIS — R911 Solitary pulmonary nodule: Secondary | ICD-10-CM

## 2024-09-23 NOTE — Progress Notes (Signed)
 Called patient he is advised of his lab results and recommendation

## 2025-01-06 ENCOUNTER — Other Ambulatory Visit

## 2025-01-13 ENCOUNTER — Encounter: Admitting: Family Medicine
